# Patient Record
Sex: Male | Born: 1940 | Race: White | Hispanic: No | Marital: Married | Smoking: Never smoker
Health system: Southern US, Community
[De-identification: ages and names within clinical notes are randomized; demographics above are authoritative.]

## PROBLEM LIST (undated history)

## (undated) DIAGNOSIS — D649 Anemia, unspecified: Secondary | ICD-10-CM

## (undated) DIAGNOSIS — I2109 ST elevation (STEMI) myocardial infarction involving other coronary artery of anterior wall: Secondary | ICD-10-CM

## (undated) DIAGNOSIS — I10 Essential (primary) hypertension: Secondary | ICD-10-CM

## (undated) DIAGNOSIS — C3490 Malignant neoplasm of unspecified part of unspecified bronchus or lung: Secondary | ICD-10-CM

## (undated) DIAGNOSIS — I2510 Atherosclerotic heart disease of native coronary artery without angina pectoris: Secondary | ICD-10-CM

## (undated) DIAGNOSIS — E039 Hypothyroidism, unspecified: Secondary | ICD-10-CM

## (undated) DIAGNOSIS — K219 Gastro-esophageal reflux disease without esophagitis: Secondary | ICD-10-CM

## (undated) DIAGNOSIS — Z8719 Personal history of other diseases of the digestive system: Secondary | ICD-10-CM

## (undated) DIAGNOSIS — J439 Emphysema, unspecified: Secondary | ICD-10-CM

## (undated) DIAGNOSIS — R599 Enlarged lymph nodes, unspecified: Secondary | ICD-10-CM

## (undated) DIAGNOSIS — C61 Malignant neoplasm of prostate: Secondary | ICD-10-CM

## (undated) DIAGNOSIS — R0600 Dyspnea, unspecified: Secondary | ICD-10-CM

## (undated) DIAGNOSIS — G4730 Sleep apnea, unspecified: Secondary | ICD-10-CM

## (undated) DIAGNOSIS — M1990 Unspecified osteoarthritis, unspecified site: Secondary | ICD-10-CM

## (undated) DIAGNOSIS — Z87442 Personal history of urinary calculi: Secondary | ICD-10-CM

## (undated) DIAGNOSIS — F4310 Post-traumatic stress disorder, unspecified: Secondary | ICD-10-CM

## (undated) DIAGNOSIS — J189 Pneumonia, unspecified organism: Secondary | ICD-10-CM

## (undated) HISTORY — PX: TONSILLECTOMY: SUR1361

## (undated) HISTORY — PX: OTHER SURGICAL HISTORY: SHX169

## (undated) HISTORY — PX: SHOULDER SURGERY: SHX246

## (undated) HISTORY — PX: COLONOSCOPY: SHX174

---

## 1999-04-27 ENCOUNTER — Ambulatory Visit (HOSPITAL_BASED_OUTPATIENT_CLINIC_OR_DEPARTMENT_OTHER): Admission: RE | Admit: 1999-04-27 | Discharge: 1999-04-27 | Payer: Self-pay | Admitting: Urology

## 1999-08-28 ENCOUNTER — Emergency Department (HOSPITAL_COMMUNITY): Admission: EM | Admit: 1999-08-28 | Discharge: 1999-08-28 | Payer: Self-pay | Admitting: Emergency Medicine

## 1999-08-30 ENCOUNTER — Encounter: Admission: RE | Admit: 1999-08-30 | Discharge: 1999-08-30 | Payer: Self-pay | Admitting: Urology

## 1999-09-25 ENCOUNTER — Encounter: Payer: Self-pay | Admitting: Urology

## 1999-09-25 ENCOUNTER — Ambulatory Visit (HOSPITAL_COMMUNITY): Admission: RE | Admit: 1999-09-25 | Discharge: 1999-09-25 | Payer: Self-pay | Admitting: Urology

## 1999-10-11 ENCOUNTER — Encounter: Admission: RE | Admit: 1999-10-11 | Discharge: 1999-10-11 | Payer: Self-pay | Admitting: Urology

## 1999-10-11 ENCOUNTER — Encounter: Payer: Self-pay | Admitting: Urology

## 2007-05-25 ENCOUNTER — Emergency Department (HOSPITAL_COMMUNITY): Admission: EM | Admit: 2007-05-25 | Discharge: 2007-05-25 | Payer: Self-pay | Admitting: Emergency Medicine

## 2007-05-25 ENCOUNTER — Emergency Department (HOSPITAL_COMMUNITY): Admission: EM | Admit: 2007-05-25 | Discharge: 2007-05-26 | Payer: Self-pay | Admitting: Emergency Medicine

## 2007-05-30 ENCOUNTER — Ambulatory Visit (HOSPITAL_COMMUNITY): Admission: RE | Admit: 2007-05-30 | Discharge: 2007-05-30 | Payer: Self-pay | Admitting: Urology

## 2011-04-03 NOTE — Op Note (Signed)
NAME:  Gregory Scott, Gregory Scott           ACCOUNT NO.:  [id number]   MEDICAL RECORD NO.:  [id number]          PATIENT TYPE:  AMB   LOCATION:  DAY                          FACILITY:  [facility]   PHYSICIAN:  Valetta Fuller, M.D.  DATE OF BIRTH:  06-05-1941   DATE OF PROCEDURE:  05/30/2007  DATE OF DISCHARGE:  05/30/2007                               OPERATIVE REPORT   PREOPERATIVE DIAGNOSIS:  Left distal ureteral calculus.   POSTOPERATIVE DIAGNOSIS:  Left distal ureteral calculus.   PROCEDURE PERFORMED:  Cystoscopy, left retrograde pyelography, left  ureteroscopy, stone basketing and left double-J stent placement 7 x 24  cm.   SURGEON:  Valetta Fuller, M.D.   ANESTHESIA:  General.   INDICATIONS:  Gregory Scott is a 70 year old male.  He has been a  longstanding patient of Dr. 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  He has required lithotripsy  in the past.  The patient recently presented to the emergency room with  left flank pain.  There, he was felt to possibly have nephrolithiasis  but only a KUB was done.  He came into our office on May 26, 2007.  At  that time he was having nausea and ongoing flank pain.  Urinalysis  showed a small degree of microhematuria and a little bit of pyuria.  A  CT scan was done which showed a 4 mm stone at his left ureterovesical  junction.  The patient was told that hopefully he would be able to pass  that spontaneously and he was started on Flomax.  The patient presented  today to our office as a work-in.  He and his wife stated that he was  having ongoing pain with significant nausea and having a hard time  keeping anything down.  She was concerned about his p.o. intake.  He had  not had a definite fever, but apparently had some mild chills.  When we  saw him in the office, he was afebrile but in moderate distress.  It  appeared that he still had a distal ureteral stone.  We talked about  several options with  him including attempts at continuing spontaneous  passage.  The patient elected to proceed with definitive surgery.  He  appeared to understand the advantages and disadvantages, and potential  complications of surgery of this type.  He elected to proceed.  The  patient did have normal renal function, although his creatinine was  probably slightly elevated from baseline at 1.6 and I do not have any  historic values on him.   TECHNIQUE AND FINDINGS:  The patient was brought to the operating room.  He had received perioperative ciprofloxacin.  He had successful  induction of general anesthesia and was then placed in lithotomy  position, prepped and draped in the usual manner.  Cystoscopy revealed  unremarkable anterior urethra.  The patient had mild trilobar  hyperplasia.  Inspection of his bladder showed significant left  hemitrigonal edema with some mounding and inflammation at his left  ureteral orifice.  The right ureteral orifice appeared quite normal.  The stone was not actually  seen but there was considerable edema with  distortion of his ureteral orifice and it was very tight.  It took [place]  approximately 20 to 25 minutes of time to try to get a Glidewire with an  open-ended stent beyond this impacted stone in his intramural ureter.  Eventually we were able to get a guidewire up to the renal pelvis.  Over  the guidewire I placed an open-ended catheter several centimeters up the  ureter and then removed the guidewire.   Retrograde pyelogram was then done by injecting contrast with  fluoroscopic guidance.  One could see a dilated ureter and renal pelvis.  This confirmed that indeed we were in the ureter and there was not an  obvious false passage at that time.  We went ahead and removed the open-  ended catheter.  I felt that, given the considerable edema and real  narrowing of his ureteral orifice, he would require some dilation.  I  chose the inside portion of an access sheath to  provide one-step  dilation of the distal ureter.  After that was accomplished, the distal  ureter was engaged with the ureteroscope.  One could see considerable  mucosa edema.  It appeared there were a couple of small false passages  within the ureter and a small flap of mucosa from the attempts at  placing the guidewire.  An impacted stone was encountered in the  intramural ureter, measured about 4 mm.  This was backed, so it  extracted without difficulty.  The guidewire was confirmed to be in good  position within the ureter.   A 7-French 24 cm stent was then placed over the guidewire with  fluoroscopic as well as visual guidance.  Good positioning was  confirmed.  The patient had some dark blood come out from the stent and  there appeared to be some degree of a hydronephrotic drip.  Lidocaine  jelly was instilled per urethra and the patient was given a B and O  suppository.  He had also received some Toradol.  The patient did well  with surgery, had no obvious complications and was brought to recovery  room in stable condition.           ______________________________  Valetta Fuller, M.D.  Electronically Signed     DSG/MEDQ  D:  05/30/2007  T:  06/01/2007  Job:  [id number]

## 2011-09-04 LAB — URINALYSIS, ROUTINE W REFLEX MICROSCOPIC
Bilirubin Urine: NEGATIVE
Bilirubin Urine: NEGATIVE
Glucose, UA: NEGATIVE
Glucose, UA: NEGATIVE
Ketones, ur: NEGATIVE
Nitrite: NEGATIVE
Protein, ur: 100 — AB
Protein, ur: NEGATIVE
Specific Gravity, Urine: 1.019
Urobilinogen, UA: 0.2
Urobilinogen, UA: 0.2
pH: 5.5

## 2011-09-04 LAB — URINE MICROSCOPIC-ADD ON

## 2011-09-04 LAB — CBC
HCT: 35.2 — ABNORMAL LOW
MCHC: 35.9
MCV: 92.2
Platelets: 264
RBC: 3.82 — ABNORMAL LOW
WBC: 10.8 — ABNORMAL HIGH

## 2011-09-04 LAB — BASIC METABOLIC PANEL
BUN: 16
CO2: 25
Chloride: 103
Creatinine, Ser: 1.64 — ABNORMAL HIGH
Potassium: 3.7

## 2011-09-04 LAB — URINE CULTURE: Culture: NO GROWTH

## 2013-05-05 ENCOUNTER — Other Ambulatory Visit (HOSPITAL_COMMUNITY): Payer: Self-pay | Admitting: *Deleted

## 2013-05-05 ENCOUNTER — Ambulatory Visit (HOSPITAL_COMMUNITY)
Admission: RE | Admit: 2013-05-05 | Discharge: 2013-05-05 | Disposition: A | Discharge status: Home / Self Care | Payer: Medicare Other | Source: Ambulatory Visit | Attending: Orthopedic Surgery | Admitting: Orthopedic Surgery

## 2013-05-05 DIAGNOSIS — M545 Low back pain, unspecified: Secondary | ICD-10-CM | POA: Insufficient documentation

## 2013-05-05 DIAGNOSIS — Q762 Congenital spondylolisthesis: Secondary | ICD-10-CM | POA: Insufficient documentation

## 2013-05-05 DIAGNOSIS — M25551 Pain in right hip: Secondary | ICD-10-CM

## 2014-12-16 DIAGNOSIS — G5761 Lesion of plantar nerve, right lower limb: Secondary | ICD-10-CM | POA: Diagnosis not present

## 2015-02-04 DIAGNOSIS — C61 Malignant neoplasm of prostate: Secondary | ICD-10-CM | POA: Diagnosis not present

## 2015-02-17 DIAGNOSIS — K760 Fatty (change of) liver, not elsewhere classified: Secondary | ICD-10-CM | POA: Diagnosis not present

## 2015-02-17 DIAGNOSIS — N359 Urethral stricture, unspecified: Secondary | ICD-10-CM | POA: Diagnosis not present

## 2015-02-17 DIAGNOSIS — C61 Malignant neoplasm of prostate: Secondary | ICD-10-CM | POA: Diagnosis not present

## 2015-02-17 DIAGNOSIS — R310 Gross hematuria: Secondary | ICD-10-CM | POA: Diagnosis not present

## 2015-02-22 DIAGNOSIS — C61 Malignant neoplasm of prostate: Secondary | ICD-10-CM | POA: Diagnosis not present

## 2015-02-22 DIAGNOSIS — F17200 Nicotine dependence, unspecified, uncomplicated: Secondary | ICD-10-CM | POA: Diagnosis not present

## 2015-02-22 DIAGNOSIS — R918 Other nonspecific abnormal finding of lung field: Secondary | ICD-10-CM | POA: Diagnosis not present

## 2015-02-22 DIAGNOSIS — G4730 Sleep apnea, unspecified: Secondary | ICD-10-CM | POA: Diagnosis not present

## 2015-02-22 DIAGNOSIS — Z720 Tobacco use: Secondary | ICD-10-CM | POA: Diagnosis not present

## 2015-02-23 DIAGNOSIS — R918 Other nonspecific abnormal finding of lung field: Secondary | ICD-10-CM | POA: Diagnosis not present

## 2015-03-01 DIAGNOSIS — G4730 Sleep apnea, unspecified: Secondary | ICD-10-CM | POA: Diagnosis not present

## 2015-03-01 DIAGNOSIS — R918 Other nonspecific abnormal finding of lung field: Secondary | ICD-10-CM | POA: Diagnosis not present

## 2015-03-01 DIAGNOSIS — C61 Malignant neoplasm of prostate: Secondary | ICD-10-CM | POA: Diagnosis not present

## 2015-03-01 DIAGNOSIS — K219 Gastro-esophageal reflux disease without esophagitis: Secondary | ICD-10-CM | POA: Diagnosis not present

## 2015-03-04 DIAGNOSIS — R918 Other nonspecific abnormal finding of lung field: Secondary | ICD-10-CM | POA: Diagnosis not present

## 2015-03-04 DIAGNOSIS — R911 Solitary pulmonary nodule: Secondary | ICD-10-CM | POA: Diagnosis not present

## 2015-03-07 DIAGNOSIS — G4730 Sleep apnea, unspecified: Secondary | ICD-10-CM | POA: Diagnosis not present

## 2015-03-07 DIAGNOSIS — R918 Other nonspecific abnormal finding of lung field: Secondary | ICD-10-CM | POA: Diagnosis not present

## 2015-03-07 DIAGNOSIS — C61 Malignant neoplasm of prostate: Secondary | ICD-10-CM | POA: Diagnosis not present

## 2015-03-10 DIAGNOSIS — C61 Malignant neoplasm of prostate: Secondary | ICD-10-CM | POA: Diagnosis not present

## 2015-06-09 DIAGNOSIS — R918 Other nonspecific abnormal finding of lung field: Secondary | ICD-10-CM | POA: Diagnosis not present

## 2015-06-14 DIAGNOSIS — C61 Malignant neoplasm of prostate: Secondary | ICD-10-CM | POA: Diagnosis not present

## 2015-06-16 DIAGNOSIS — R918 Other nonspecific abnormal finding of lung field: Secondary | ICD-10-CM | POA: Diagnosis not present

## 2015-06-23 DIAGNOSIS — R918 Other nonspecific abnormal finding of lung field: Secondary | ICD-10-CM | POA: Diagnosis not present

## 2015-07-26 DIAGNOSIS — S83207A Unspecified tear of unspecified meniscus, current injury, left knee, initial encounter: Secondary | ICD-10-CM | POA: Diagnosis not present

## 2015-07-26 DIAGNOSIS — M25562 Pain in left knee: Secondary | ICD-10-CM | POA: Diagnosis not present

## 2015-08-16 DIAGNOSIS — M25562 Pain in left knee: Secondary | ICD-10-CM | POA: Diagnosis not present

## 2015-09-13 DIAGNOSIS — C61 Malignant neoplasm of prostate: Secondary | ICD-10-CM | POA: Diagnosis not present

## 2015-09-20 DIAGNOSIS — M7021 Olecranon bursitis, right elbow: Secondary | ICD-10-CM | POA: Diagnosis not present

## 2015-09-21 ENCOUNTER — Inpatient Hospital Stay (HOSPITAL_COMMUNITY)
Admission: EM | Admit: 2015-09-21 | Discharge: 2015-09-23 | DRG: 247 | Disposition: A | Discharge status: Home / Self Care | Payer: Medicare Other | Attending: Cardiovascular Disease | Admitting: Cardiovascular Disease

## 2015-09-21 ENCOUNTER — Encounter (HOSPITAL_COMMUNITY)
Admission: EM | Disposition: A | Discharge status: Home / Self Care | Payer: Federal, State, Local not specified - PPO | Source: Home / Self Care | Attending: Cardiovascular Disease

## 2015-09-21 ENCOUNTER — Encounter (HOSPITAL_COMMUNITY): Payer: Self-pay | Admitting: Emergency Medicine

## 2015-09-21 ENCOUNTER — Emergency Department (HOSPITAL_COMMUNITY): Payer: Medicare Other

## 2015-09-21 DIAGNOSIS — J439 Emphysema, unspecified: Secondary | ICD-10-CM | POA: Diagnosis present

## 2015-09-21 DIAGNOSIS — I10 Essential (primary) hypertension: Secondary | ICD-10-CM | POA: Diagnosis not present

## 2015-09-21 DIAGNOSIS — E785 Hyperlipidemia, unspecified: Secondary | ICD-10-CM | POA: Insufficient documentation

## 2015-09-21 DIAGNOSIS — I213 ST elevation (STEMI) myocardial infarction of unspecified site: Secondary | ICD-10-CM | POA: Insufficient documentation

## 2015-09-21 DIAGNOSIS — Z955 Presence of coronary angioplasty implant and graft: Secondary | ICD-10-CM

## 2015-09-21 DIAGNOSIS — I2102 ST elevation (STEMI) myocardial infarction involving left anterior descending coronary artery: Secondary | ICD-10-CM

## 2015-09-21 DIAGNOSIS — R079 Chest pain, unspecified: Secondary | ICD-10-CM | POA: Diagnosis not present

## 2015-09-21 DIAGNOSIS — Z8546 Personal history of malignant neoplasm of prostate: Secondary | ICD-10-CM | POA: Diagnosis not present

## 2015-09-21 DIAGNOSIS — I2109 ST elevation (STEMI) myocardial infarction involving other coronary artery of anterior wall: Secondary | ICD-10-CM | POA: Diagnosis not present

## 2015-09-21 DIAGNOSIS — Z923 Personal history of irradiation: Secondary | ICD-10-CM

## 2015-09-21 DIAGNOSIS — I2510 Atherosclerotic heart disease of native coronary artery without angina pectoris: Secondary | ICD-10-CM

## 2015-09-21 DIAGNOSIS — E039 Hypothyroidism, unspecified: Secondary | ICD-10-CM | POA: Diagnosis present

## 2015-09-21 HISTORY — DX: ST elevation (STEMI) myocardial infarction involving other coronary artery of anterior wall: I21.09

## 2015-09-21 HISTORY — PX: CARDIAC CATHETERIZATION: SHX172

## 2015-09-21 HISTORY — DX: Emphysema, unspecified: J43.9

## 2015-09-21 HISTORY — DX: Malignant neoplasm of prostate: C61

## 2015-09-21 LAB — CREATININE, SERUM
CREATININE: 0.95 mg/dL (ref 0.61–1.24)
GFR calc Af Amer: >60 mL/min (ref >60)
GFR calc non Af Amer: >60 mL/min (ref >60)

## 2015-09-21 LAB — CBC
HCT: 35.3 % — ABNORMAL LOW (ref 39.0–52.0)
HCT: 31.3 % — ABNORMAL LOW (ref 39.0–52.0)
Hemoglobin: 12.1 g/dL — ABNORMAL LOW (ref 13.0–17.0)
Hemoglobin: 10.9 g/dL — ABNORMAL LOW (ref 13.0–17.0)
MCH: 30.8 pg (ref 26.0–34.0)
MCH: 31.4 pg (ref 26.0–34.0)
MCHC: 34.3 g/dL (ref 30.0–36.0)
MCHC: 34.8 g/dL (ref 30.0–36.0)
MCV: 89.8 fL (ref 78.0–100.0)
MCV: 90.2 fL (ref 78.0–100.0)
PLATELETS: 246 10*3/uL (ref 150–400)
PLATELETS: 195 10*3/uL (ref 150–400)
RBC: 3.93 MIL/uL — ABNORMAL LOW (ref 4.22–5.81)
RBC: 3.47 MIL/uL — AB (ref 4.22–5.81)
RDW: 12 % (ref 11.5–15.5)
RDW: 12 % (ref 11.5–15.5)
WBC: 7.5 10*3/uL (ref 4.0–10.5)
WBC: 10 10*3/uL (ref 4.0–10.5)

## 2015-09-21 LAB — COMPREHENSIVE METABOLIC PANEL
ALBUMIN: 3.7 g/dL (ref 3.5–5.0)
ALT: 27 U/L (ref 17–63)
ANION GAP: 10 (ref 5–15)
AST: 30 U/L (ref 15–41)
Alkaline Phosphatase: 71 U/L (ref 38–126)
BUN: 12 mg/dL (ref 6–20)
CO2: 19 mmol/L — AB (ref 22–32)
Calcium: 10.1 mg/dL (ref 8.9–10.3)
Chloride: 103 mmol/L (ref 101–111)
Creatinine, Ser: 1.06 mg/dL (ref 0.61–1.24)
GFR calc Af Amer: >60 mL/min (ref >60)
GLUCOSE: 170 mg/dL — AB (ref 65–99)
POTASSIUM: 3.8 mmol/L (ref 3.5–5.1)
Sodium: 132 mmol/L — ABNORMAL LOW (ref 135–145)
TOTAL PROTEIN: 6.8 g/dL (ref 6.5–8.1)
Total Bilirubin: 0.9 mg/dL (ref 0.3–1.2)

## 2015-09-21 LAB — I-STAT CHEM 8, ED
BUN: 17 mg/dL (ref 6–20)
CREATININE: 0.9 mg/dL (ref 0.61–1.24)
Calcium, Ion: 1.27 mmol/L (ref 1.13–1.30)
Chloride: 104 mmol/L (ref 101–111)
Glucose, Bld: 172 mg/dL — ABNORMAL HIGH (ref 65–99)
HEMATOCRIT: 37 % — AB (ref 39.0–52.0)
Hemoglobin: 12.6 g/dL — ABNORMAL LOW (ref 13.0–17.0)
POTASSIUM: 3.9 mmol/L (ref 3.5–5.1)
SODIUM: 138 mmol/L (ref 135–145)
TCO2: 17 mmol/L (ref 0–100)

## 2015-09-21 LAB — MRSA PCR SCREENING: MRSA BY PCR: NEGATIVE

## 2015-09-21 LAB — POCT ACTIVATED CLOTTING TIME
ACTIVATED CLOTTING TIME: 405 s
Activated Clotting Time: 134 seconds

## 2015-09-21 LAB — POCT I-STAT TROPONIN I: Troponin i, poc: 0.03 ng/mL (ref 0.00–0.08)

## 2015-09-21 LAB — BRAIN NATRIURETIC PEPTIDE: B NATRIURETIC PEPTIDE 5: 118 pg/mL — AB (ref 0.0–100.0)

## 2015-09-21 LAB — T4, FREE: Free T4: 0.94 ng/dL (ref 0.61–1.12)

## 2015-09-21 LAB — TSH: TSH: 2.091 u[IU]/mL (ref 0.350–4.500)

## 2015-09-21 LAB — APTT: APTT: 24 s (ref 24–37)

## 2015-09-21 LAB — PROTIME-INR
INR: 1.05 (ref 0.00–1.49)
Prothrombin Time: 13.9 seconds (ref 11.6–15.2)

## 2015-09-21 LAB — TROPONIN I
TROPONIN I: 9.44 ng/mL — AB (ref <0.031)
Troponin I: 1.25 ng/mL (ref <0.031)
Troponin I: 7.01 ng/mL (ref <0.031)

## 2015-09-21 SURGERY — LEFT HEART CATH AND CORONARY ANGIOGRAPHY
Anesthesia: LOCAL | Laterality: Right

## 2015-09-21 MED ORDER — VERAPAMIL HCL 2.5 MG/ML IV SOLN
INTRAVENOUS | Status: DC | PRN
  Administered 2015-09-21: 08:00:00 via INTRA_ARTERIAL

## 2015-09-21 MED ORDER — TICAGRELOR 90 MG PO TABS
ORAL_TABLET | ORAL | Status: AC
  Filled 2015-09-21: qty 1

## 2015-09-21 MED ORDER — SODIUM CHLORIDE 0.9 % IV SOLN
INTRAVENOUS | Status: DC
  Administered 2015-09-21: 20 mL/h via INTRAVENOUS

## 2015-09-21 MED ORDER — ACETAMINOPHEN 325 MG PO TABS: 650.0000 mg | ORAL_TABLET | ORAL | Status: DC | PRN

## 2015-09-21 MED ORDER — BIVALIRUDIN 250 MG IV SOLR
INTRAVENOUS | Status: AC
  Filled 2015-09-21: qty 250

## 2015-09-21 MED ORDER — MIDAZOLAM HCL 2 MG/2ML IJ SOLN
INTRAMUSCULAR | Status: AC
  Filled 2015-09-21: qty 4

## 2015-09-21 MED ORDER — FENTANYL CITRATE (PF) 100 MCG/2ML IJ SOLN
INTRAMUSCULAR | Status: DC | PRN
  Administered 2015-09-21: 25 ug via INTRAVENOUS

## 2015-09-21 MED ORDER — NITROGLYCERIN 1 MG/10 ML FOR IR/CATH LAB
INTRA_ARTERIAL | Status: DC | PRN
  Administered 2015-09-21: 150 ug via INTRACORONARY

## 2015-09-21 MED ORDER — LIDOCAINE HCL (PF) 1 % IJ SOLN
INTRAMUSCULAR | Status: AC
  Filled 2015-09-21: qty 30

## 2015-09-21 MED ORDER — SODIUM CHLORIDE 0.9 % IJ SOLN: 3.0000 mL | INTRAMUSCULAR | Status: DC | PRN

## 2015-09-21 MED ORDER — SODIUM CHLORIDE 0.9 % IV SOLN
INTRAVENOUS | Status: AC
  Administered 2015-09-21: 11:00:00 via INTRAVENOUS

## 2015-09-21 MED ORDER — SODIUM CHLORIDE 0.9 % IV SOLN: 250.0000 mL | INTRAVENOUS | Status: DC | PRN

## 2015-09-21 MED ORDER — HEPARIN SODIUM (PORCINE) 5000 UNIT/ML IJ SOLN
INTRAMUSCULAR | Status: AC
  Filled 2015-09-21: qty 1

## 2015-09-21 MED ORDER — TICAGRELOR 90 MG PO TABS
ORAL_TABLET | ORAL | Status: DC | PRN
  Administered 2015-09-21: 180 mg via ORAL

## 2015-09-21 MED ORDER — HEPARIN SODIUM (PORCINE) 1000 UNIT/ML IJ SOLN
INTRAMUSCULAR | Status: AC
  Filled 2015-09-21: qty 1

## 2015-09-21 MED ORDER — HEPARIN SODIUM (PORCINE) 5000 UNIT/ML IJ SOLN
4000.0000 [IU] | INTRAMUSCULAR | Status: AC
  Administered 2015-09-21: 4000 [IU] via INTRAVENOUS

## 2015-09-21 MED ORDER — NITROGLYCERIN 1 MG/10 ML FOR IR/CATH LAB
INTRA_ARTERIAL | Status: AC
  Filled 2015-09-21: qty 10

## 2015-09-21 MED ORDER — MIDAZOLAM HCL 2 MG/2ML IJ SOLN
INTRAMUSCULAR | Status: DC | PRN
  Administered 2015-09-21: 1 mg via INTRAVENOUS

## 2015-09-21 MED ORDER — INFLUENZA VAC SPLIT QUAD 0.5 ML IM SUSY
0.5000 mL | PREFILLED_SYRINGE | INTRAMUSCULAR | Status: AC
  Administered 2015-09-22: 0.5 mL via INTRAMUSCULAR
  Filled 2015-09-21: qty 0.5

## 2015-09-21 MED ORDER — NITROGLYCERIN 0.4 MG SL SUBL: 0.4000 mg | SUBLINGUAL_TABLET | SUBLINGUAL | Status: DC | PRN

## 2015-09-21 MED ORDER — VERAPAMIL HCL 2.5 MG/ML IV SOLN
INTRAVENOUS | Status: AC
  Filled 2015-09-21: qty 2

## 2015-09-21 MED ORDER — SODIUM CHLORIDE 0.9 % IV SOLN
250.0000 mg | INTRAVENOUS | Status: DC | PRN
  Administered 2015-09-21: 0.25 mg/kg/h via INTRAVENOUS

## 2015-09-21 MED ORDER — ONDANSETRON HCL 4 MG/2ML IJ SOLN: 4.0000 mg | Freq: Four times a day (QID) | INTRAMUSCULAR | Status: DC | PRN

## 2015-09-21 MED ORDER — OXYCODONE-ACETAMINOPHEN 5-325 MG PO TABS
1.0000 | ORAL_TABLET | ORAL | Status: DC | PRN
  Administered 2015-09-21 (×2): 1 via ORAL
  Filled 2015-09-21 (×2): qty 1

## 2015-09-21 MED ORDER — CARVEDILOL 3.125 MG PO TABS
3.1250 mg | ORAL_TABLET | Freq: Two times a day (BID) | ORAL | Status: DC
  Administered 2015-09-21 – 2015-09-23 (×5): 3.125 mg via ORAL
  Filled 2015-09-21 (×5): qty 1

## 2015-09-21 MED ORDER — TICAGRELOR 90 MG PO TABS
90.0000 mg | ORAL_TABLET | Freq: Two times a day (BID) | ORAL | Status: DC
  Administered 2015-09-21 – 2015-09-23 (×4): 90 mg via ORAL
  Filled 2015-09-21 (×4): qty 1

## 2015-09-21 MED ORDER — FENTANYL CITRATE (PF) 100 MCG/2ML IJ SOLN
INTRAMUSCULAR | Status: AC
  Filled 2015-09-21: qty 4

## 2015-09-21 MED ORDER — SODIUM CHLORIDE 0.9 % IJ SOLN
3.0000 mL | Freq: Two times a day (BID) | INTRAMUSCULAR | Status: DC
  Administered 2015-09-21 – 2015-09-23 (×4): 3 mL via INTRAVENOUS

## 2015-09-21 MED ORDER — ASPIRIN EC 81 MG PO TBEC
81.0000 mg | DELAYED_RELEASE_TABLET | Freq: Every day | ORAL | Status: DC
  Administered 2015-09-22 – 2015-09-23 (×2): 81 mg via ORAL
  Filled 2015-09-21 (×3): qty 1

## 2015-09-21 MED ORDER — HEPARIN SODIUM (PORCINE) 5000 UNIT/ML IJ SOLN
5000.0000 [IU] | Freq: Three times a day (TID) | INTRAMUSCULAR | Status: DC
  Administered 2015-09-21 – 2015-09-23 (×5): 5000 [IU] via SUBCUTANEOUS
  Filled 2015-09-21 (×5): qty 1

## 2015-09-21 MED ORDER — HEPARIN (PORCINE) IN NACL 2-0.9 UNIT/ML-% IJ SOLN
INTRAMUSCULAR | Status: AC
  Filled 2015-09-21: qty 1000

## 2015-09-21 SURGICAL SUPPLY — 22 items
BALLN TREK RX 2.5X12 (BALLOONS) ×3
BALLN ~~LOC~~ EMERGE MR 3.75X12 (BALLOONS) ×3
BALLOON TREK RX 2.5X12 (BALLOONS) ×2 IMPLANT
BALLOON ~~LOC~~ EMERGE MR 3.75X12 (BALLOONS) ×2 IMPLANT
CATH INFINITI 5FR ANG PIGTAIL (CATHETERS) ×3 IMPLANT
CATH INFINITI 5FR MULTPACK ANG (CATHETERS) IMPLANT
CATH INFINITI JR4 5F (CATHETERS) ×3 IMPLANT
CATH VISTA GUIDE 6FR XBLAD3.5 (CATHETERS) ×3 IMPLANT
DEVICE RAD COMP TR BAND LRG (VASCULAR PRODUCTS) ×3 IMPLANT
GLIDESHEATH SLEND SS 6F .021 (SHEATH) ×3 IMPLANT
KIT ENCORE 26 ADVANTAGE (KITS) ×3 IMPLANT
KIT HEART LEFT (KITS) ×3 IMPLANT
PACK CARDIAC CATHETERIZATION (CUSTOM PROCEDURE TRAY) ×3 IMPLANT
SHEATH PINNACLE 5F 10CM (SHEATH) IMPLANT
STENT SYNERGY DES 3.5X16 (Permanent Stent) ×3 IMPLANT
SYR MEDRAD MARK V 150ML (SYRINGE) ×3 IMPLANT
TRANSDUCER W/STOPCOCK (MISCELLANEOUS) ×3 IMPLANT
TUBING CIL FLEX 10 FLL-RA (TUBING) ×3 IMPLANT
TUBING CONTRAST HIGH PRESS 20 (MISCELLANEOUS) ×3 IMPLANT
WIRE COUGAR XT STRL 190CM (WIRE) ×6 IMPLANT
WIRE EMERALD 3MM-J .035X150CM (WIRE) IMPLANT
WIRE SAFE-T 1.5MM-J .035X260CM (WIRE) ×3 IMPLANT

## 2015-09-21 NOTE — ED Notes (Signed)
Pt placed in gown and in bed. Pt monitored by pulse ox, bp cuff, 12-lead, and Zoll with radiolucent pads.

## 2015-09-21 NOTE — ED Provider Notes (Signed)
CSN: [id number]     Arrival date & time 09/21/15  1856 History   First MD Initiated Contact with Patient 09/21/15 [phone]     Chief Complaint  Patient presents with  . Code STEMI     (Consider location/radiation/quality/duration/timing/severity/associated sxs/prior Treatment) The history is provided by the patient and the EMS personnel.   patient presents with a STEMI. EMS had a change in the chest pain and had a second EKG that showed ST elevation. STEMI was called prehospital. Patient continues to have chest pressure. Again early this morning but has felt bad nonspecifically over last couple days. No blood in stool. No shortness of breath. No cough. States it is a pressure limit was chest. No relief with nitroglycerin. He has had aspirin already.  History reviewed. No pertinent past medical history. No past surgical history on file. No family history on file. Social History  Substance Use Topics  . Smoking status: Never Smoker   . Smokeless tobacco: None  . Alcohol Use: No    Review of Systems  Constitutional: Positive for diaphoresis.  Respiratory: Negative for shortness of breath.   Cardiovascular: Positive for chest pain.  Gastrointestinal: Negative for nausea.  Genitourinary: Negative for flank pain.  Musculoskeletal: Negative for back pain.  Skin: Negative for wound.  Neurological: Negative for light-headedness.      Allergies  Statins  Home Medications   Prior to Admission medications   Not on File   BP 117/75 mmHg  Pulse 66  Resp 16  Ht _0  (1.778 m)  Wt 170 lb (77.111 kg)  BMI 24.39 kg/m2  SpO2 96% Physical Exam  Constitutional: He appears well-developed.  Diaphoresis blow nose. Patient appears uncomfortable  HENT:  Head: Atraumatic.  Neck: Neck supple.  Cardiovascular: Normal rate.   Pulmonary/Chest: Effort normal and breath sounds normal.  Abdominal: Soft.  Musculoskeletal: Normal range of motion.  Ace bandage around right elbow.   Neurological: He is alert.  Skin: Skin is warm. He is diaphoretic.    ED Course  Procedures (including critical care time) Labs Review Labs Reviewed  I-STAT CHEM 8, ED - Abnormal; Notable for the following:    Glucose, Bld 172 (*)    Hemoglobin 12.6 (*)    HCT 37.0 (*)    All other components within normal limits  APTT  CBC  COMPREHENSIVE METABOLIC PANEL  PROTIME-INR  I-STAT TROPOININ, ED  POCT I-STAT TROPONIN I    Imaging Review No results found. I have personally reviewed and evaluated these images and lab results as part of my medical decision-making.   EKG Interpretation   Date/Time:  Wednesday September 21 2015 07:24:36 EDT Ventricular Rate:  55 PR Interval:  141 QRS Duration: 89 QT Interval:  417 QTC Calculation: 399 R Axis:   -9 Text Interpretation:  Sinus rhythm Left ventricular hypertrophy  Nonspecific T abnormalities, inferior leads ST elevation, consider  anterolateral injury ** ** ACUTE MI / STEMI ** ** Confirmed by [place]   MD, Kimberli Winne [phone]) on 09/21/2015 7:30:51 AM      MDM   Final diagnoses:  ST elevation myocardial infarction (STEMI), unspecified artery [facility])    Patient with chest pain and ST elevation on EKG. Met in ED by Dr. Burt Knack and taken emergently to cath lab. Already had aspirin and heparin was given in the ED.    Davonna Belling, MD 09/21/15 [phone]

## 2015-09-21 NOTE — H&P (Signed)
History and Physical  Patient ID: Gregory Scott MRN: [id number], SOB: 1941/11/12 74 y.o. Date of Encounter: 09/21/2015, 7:47 AM  Primary Physician: No primary care provider on file. Primary Cardiologist: new  Chief Complaint: chest pain  HPI: 74 y.o. male who presented to [facility] on 09/21/2015 with complaints of chest pain. He is brought in by EMS as a 'Code STEMI.'   The patient has had chest pain on and off for 48 hours. He has no past history of cardiac disease. States that he "felt really bad" 2 days ago. This morning, he awoke with severe substernal chest pain radiating to both arms. He became short of breath and broke out in a sweat. He called EMS. EKG is suggestive of a lateral MI and a code STEMI is paged out. In the emergency department, he received aspirin 324 mg and heparin 4000 units intravenously. Currently, the patient rates his pain at 6/10.  The patient has chronic shortness of breath. He states that he has emphysema, but he has never smoked. 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 He states this is being observed. He has not had any specific treatment. The patient has a history of prostate cancer about 4 years ago and was treated with radiation seeds. He has no other complaints at present.   Past Medical History  Diagnosis Date  . Emphysema lung ([place])   . Prostate cancer [facility])      Surgical History:  Past Surgical History  Procedure Laterality Date  . Knee surgery    . Shoulder surgery    . Tonsillectomy       Home Meds: Prior to Admission medications   Not on File    Allergies:  Allergies  Allergen Reactions  . Statins     Social History   Social History  . Marital Status: Married    Spouse Name: N/A  . Number of Children: N/A  . Years of Education: N/A   Occupational History  . Not on file.   Social History Main  Topics  . Smoking status: Never Smoker   . Smokeless tobacco: Not on file  . Alcohol Use: No  . Drug Use: Not on file  . Sexual Activity: Not on file   Other Topics Concern  . Not on file   Social History Narrative   Worked in a Foundry   Married   Nonsmoker     Family History  Problem Relation Age of Onset  . Coronary artery disease Mother 44    Review of Systems: General: negative for chills, fever, night sweats or weight changes.  ENT: negative for rhinorrhea or epistaxis Cardiovascular: see HPI Dermatological: negative for rash Respiratory:  Positive for shortness of breath GI: negative for nausea, vomiting, diarrhea, bright red blood per rectum, melena, or hematemesis GU: no hematuria, urgency, or frequency Neurologic: negative for visual changes, syncope, headache, or dizziness Heme: no easy bruising or bleeding Endo: negative for excessive thirst, thyroid disorder, or flushing Musculoskeletal: negative for joint pain or swelling, negative for myalgias All other systems reviewed and are otherwise negative except as noted above.  Physical Exam: Blood pressure 117/75, pulse 66, resp. rate 16, height '5\' 10"'$  (1.778 m), weight 170 lb (77.111 kg), SpO2 96 %. General: Well developed, well nourished, alert and oriented, in no acute distress. HEENT: Normocephalic, atraumatic, sclera anicteric Neck: Supple.  Carotids 2+ without bruits. JVP normal Lungs:  Prolonged  expiratory phase , clear lung fields Heart: RRR with normal S1 and S2. No murmurs, rubs, or gallops appreciated. Abdomen: Soft, non-tender, non-distended with normoactive bowel sounds. No hepatomegaly. No rebound/guarding. No obvious abdominal masses. Back: No CVA tenderness Msk:  Strength and tone appear normal for age. Extremities: No clubbing, cyanosis, or edema.  Distal pedal pulses are 2+ and equal bilaterally. Neuro: CNII-XII intact, moves all extremities spontaneously. Psych:  Responds to questions  appropriately with a normal affect. Skin: warm and dry without rash   Labs:   Lab Results  Component Value Date   WBC 7.5 09/21/2015   HGB 12.6* 09/21/2015   HCT 37.0* 09/21/2015   MCV 89.8 09/21/2015   PLT 246 09/21/2015     Recent Labs Lab 09/21/15 0734  NA 138  K 3.9  CL 104  BUN 17  CREATININE 0.90  GLUCOSE 172*   No results for input(s): CKTOTAL, CKMB, TROPONINI in the last 72 hours. No results found for: CHOL, HDL, LDLCALC, TRIG No results found for: DDIMER  Radiology/Studies:  No results found.   EKG:  Sinus rhythm with acute lateral injury  pattern  ASSESSMENT AND PLAN:   1. Acute anterolateral STEMI: The patient has received aspirin and heparin. He will  Be taken emergently to the cardiac catheterization lab for catheterization and primary PCI. Further disposition pending findings and the Cath Lab.  2. Hyperlipidemia: Unable to take statin drugs. He has had severe myalgias. Will have to explore alternatives pending his lipids which will be checked with tomorrow morning's lab work.   3. Emphysema: Will monitor his respiratory status carefully during his hospitalization.  Deatra James MD 09/21/2015, 7:47 AM

## 2015-09-21 NOTE — ED Notes (Signed)
From work via [employer], STEMI called on arrival, CP onset this AM at work, SOB, diaphoretic, VSS,   324 ASA given 2 nitros

## 2015-09-22 DIAGNOSIS — I10 Essential (primary) hypertension: Secondary | ICD-10-CM

## 2015-09-22 DIAGNOSIS — I2109 ST elevation (STEMI) myocardial infarction involving other coronary artery of anterior wall: Principal | ICD-10-CM

## 2015-09-22 DIAGNOSIS — E785 Hyperlipidemia, unspecified: Secondary | ICD-10-CM | POA: Insufficient documentation

## 2015-09-22 LAB — BASIC METABOLIC PANEL
Anion gap: 5 (ref 5–15)
BUN: 14 mg/dL (ref 6–20)
CALCIUM: 9.2 mg/dL (ref 8.9–10.3)
CO2: 24 mmol/L (ref 22–32)
CREATININE: 1.12 mg/dL (ref 0.61–1.24)
Chloride: 109 mmol/L (ref 101–111)
GLUCOSE: 116 mg/dL — AB (ref 65–99)
Potassium: 3.9 mmol/L (ref 3.5–5.1)
Sodium: 138 mmol/L (ref 135–145)

## 2015-09-22 LAB — LIPID PANEL
CHOLESTEROL: 272 mg/dL — AB (ref 0–200)
HDL: 40 mg/dL — ABNORMAL LOW (ref >40)
LDL CALC: 198 mg/dL — AB (ref 0–99)
TRIGLYCERIDES: 171 mg/dL — AB (ref <150)
Total CHOL/HDL Ratio: 6.8 RATIO
VLDL: 34 mg/dL (ref 0–40)

## 2015-09-22 LAB — CBC
HCT: 35 % — ABNORMAL LOW (ref 39.0–52.0)
Hemoglobin: 11.6 g/dL — ABNORMAL LOW (ref 13.0–17.0)
MCH: 30.3 pg (ref 26.0–34.0)
MCHC: 33.1 g/dL (ref 30.0–36.0)
MCV: 91.4 fL (ref 78.0–100.0)
Platelets: 224 K/uL (ref 150–400)
RBC: 3.83 MIL/uL — ABNORMAL LOW (ref 4.22–5.81)
RDW: 12.3 % (ref 11.5–15.5)
WBC: 10.3 K/uL (ref 4.0–10.5)

## 2015-09-22 LAB — HEMOGLOBIN A1C
Hgb A1c MFr Bld: 5.7 % — ABNORMAL HIGH (ref 4.8–5.6)
Mean Plasma Glucose: 117 mg/dL

## 2015-09-22 LAB — TROPONIN I: Troponin I: 5.8 ng/mL (ref <0.031)

## 2015-09-22 MED ORDER — PRAZOSIN HCL 1 MG PO CAPS
1.0000 mg | ORAL_CAPSULE | Freq: Every day | ORAL | Status: DC
  Administered 2015-09-22: 1 mg via ORAL
  Filled 2015-09-22 (×2): qty 1

## 2015-09-22 MED ORDER — ROPINIROLE HCL 0.25 MG PO TABS: 0.2500 mg | ORAL_TABLET | Freq: Three times a day (TID) | ORAL | Status: DC

## 2015-09-22 MED ORDER — ROPINIROLE HCL 1 MG PO TABS
0.5000 mg | ORAL_TABLET | Freq: Every day | ORAL | Status: DC
  Administered 2015-09-22: 0.5 mg via ORAL
  Filled 2015-09-22: qty 1

## 2015-09-22 MED ORDER — ALBUTEROL SULFATE (2.5 MG/3ML) 0.083% IN NEBU: 2.5000 mg | INHALATION_SOLUTION | Freq: Four times a day (QID) | RESPIRATORY_TRACT | Status: DC | PRN

## 2015-09-22 MED ORDER — LISINOPRIL 2.5 MG PO TABS
2.5000 mg | ORAL_TABLET | Freq: Every day | ORAL | Status: DC
  Administered 2015-09-22 – 2015-09-23 (×2): 2.5 mg via ORAL
  Filled 2015-09-22 (×2): qty 1

## 2015-09-22 MED ORDER — SERTRALINE HCL 50 MG PO TABS
50.0000 mg | ORAL_TABLET | Freq: Every day | ORAL | Status: DC
  Administered 2015-09-23: 50 mg via ORAL
  Filled 2015-09-22: qty 1

## 2015-09-22 MED ORDER — ACETAMINOPHEN-CODEINE #3 300-30 MG PO TABS: 1.0000 | ORAL_TABLET | Freq: Every day | ORAL | Status: DC | PRN

## 2015-09-22 MED ORDER — ROSUVASTATIN CALCIUM 10 MG PO TABS
5.0000 mg | ORAL_TABLET | ORAL | Status: DC
  Administered 2015-09-22: 5 mg via ORAL
  Filled 2015-09-22: qty 1

## 2015-09-22 MED ORDER — GABAPENTIN 300 MG PO CAPS
300.0000 mg | ORAL_CAPSULE | Freq: Every day | ORAL | Status: DC
  Administered 2015-09-22: 300 mg via ORAL
  Filled 2015-09-22: qty 1

## 2015-09-22 MED ORDER — LEVOTHYROXINE SODIUM 50 MCG PO TABS: 50.0000 ug | ORAL_TABLET | Freq: Every day | ORAL | Status: DC

## 2015-09-22 MED ORDER — ROPINIROLE HCL 0.25 MG PO TABS
0.2500 mg | ORAL_TABLET | Freq: Two times a day (BID) | ORAL | Status: DC
  Administered 2015-09-23 (×2): 0.25 mg via ORAL
  Filled 2015-09-22 (×3): qty 1

## 2015-09-22 MED FILL — Heparin Sodium (Porcine) 2 Unit/ML in Sodium Chloride 0.9%: INTRAMUSCULAR | Qty: 500 | Status: AC

## 2015-09-22 MED FILL — Lidocaine HCl Local Preservative Free (PF) Inj 1%: INTRAMUSCULAR | Qty: 30 | Status: AC

## 2015-09-22 NOTE — Progress Notes (Signed)
CARDIAC REHAB PHASE I   PRE:  Rate/Rhythm: 64 SR    BP: sitting 113/66    SaO2: 97 RA  MODE:  Ambulation: 700 ft   POST:  Rate/Rhythm: 85 SR    BP: sitting 104/68     SaO2:   Tolerated well, no c/o except his normal SOB (seems minimal). Ed completed with good understanding. Will send referral to [place].  [id number]   Darrick Meigs CES, ACSM 09/22/2015 9:55 AM

## 2015-09-22 NOTE — Care Management Note (Signed)
Case Management Note  Patient Details  Name: Amerigo Mcglory MRN: [id number] Date of Birth: July 20, 1941  Subjective/Objective:       Adm w mi             Action/Plan: lives w wife   Expected Discharge Date:                  Expected Discharge Plan:  Home/Self Care  In-House Referral:     Discharge planning Services  CM Consult  Post Acute Care Choice:    Choice offered to:     DME Arranged:    DME Agency:     HH Arranged:    HH Agency:     Status of Service:  In process, will continue to follow  Medicare Important Message Given:    Date Medicare IM Given:    Medicare IM give by:    Date Additional Medicare IM Given:    Additional Medicare Important Message give by:     If discussed at [place] of Stay Meetings, dates discussed:    Additional Comments: gave pt 30day free brilinta card.  Lacretia Leigh, RN 09/22/2015, 9:41 AM

## 2015-09-22 NOTE — Progress Notes (Signed)
    Subjective:  This AM, he denies any chest pain and feels much better. Able to ambulate around the unit.  Objective:  Vital Signs in the last 24 hours: Temp:  [97.7 F (36.5 C)-98.8 F (37.1 C)] 98 F (36.7 C) (11/03 0400) Pulse Rate:  [0-295] 58 (11/03 0600) Resp:  [0-27] 17 (11/02 1600) BP: (110-152)/(62-100) 121/71 mmHg (11/03 0600) SpO2:  [0 %-99 %] 96 % (11/03 0600) Weight:  [170 lb (77.111 kg)-170 lb 13.7 oz (77.5 kg)] 170 lb 13.7 oz (77.5 kg) (11/02 0915)  Intake/Output from previous day: 11/02 0701 - 11/03 0700 In: 643.3 [P.O.:600; I.V.:43.3] Out: 1250 [Urine:1250]  Physical Exam: Pt is alert and oriented, NAD, sitting up in chair HEENT: normal Neck: JVP - normal Lungs: CTA bilaterally CV: RRR without murmur or gallop Abd: soft, NT, Positive BS, no hepatomegaly Ext: no C/C/E, distal pulses intact and equal Skin: warm/dry no rash   Lab Results:  Recent Labs  09/21/15 1000 09/22/15 0231  WBC 10.0 10.3  HGB 10.9* 11.6*  PLT 195 224    Recent Labs  09/21/15 0723 09/21/15 0734 09/21/15 1000 09/22/15 0231  NA 132* 138  --  138  K 3.8 3.9  --  3.9  CL 103 104  --  109  CO2 19*  --   --  24  GLUCOSE 170* 172*  --  116*  BUN 12 17  --  14  CREATININE 1.06 0.90 0.95 1.12    Recent Labs  09/21/15 1506 09/21/15 2055  TROPONINI 7.01* 9.44*    Cardiac Studies: Procedures  Mid RCA lesion, 25% stenosed.  Prox LAD lesion, 99% stenosed. Post intervention, there is a 0% residual stenosis.  There is mild left ventricular systolic dysfunction.  1. Critical stenosis of the proximal LAD, treated successfully with a 3.5 mm drug-eluting stent 2. Minor nonobstructive stenosis of the RCA 3. Widely patent left circumflex with no significant stenosis 4. Mild segmental LV systolic dysfunction consistent with the patient's acute anterolateral MI, estimated LVEF 45-50%  Assessment/Plan:  74 year old male with h/o prostate cancer s/p brachytherapy who  presented with acute anterolateral STEMI s/p proximal DES to proximal LAD (11/2).   Anteriolateral STEMI s/p DES: EKG with ST changes in anteriolateral leads though improved on repeat EKG. No symptoms currently.  -Continue Coreg 3.'125mg'$  twice daily.  Systolic dysfunction: EF 94-17% on cath yesterday. -Check echo today.  Hyperlipidemia: Total cholesterol 272, LDL 198. Intolerance to statins in the past due to myalgias and confirmed with him at bedside. Counseled him on the risks vs benefits of statin therapy and is agreeable to trying Crestor '5mg'$  three times weekly. Has follow-up through the [place]. -Start Crestor '5mg'$  T/H/Sa  Charlott Rakes, PGY2 Internal Medicine Pager: [phone]

## 2015-09-23 ENCOUNTER — Inpatient Hospital Stay (HOSPITAL_COMMUNITY): Payer: Medicare Other

## 2015-09-23 DIAGNOSIS — I213 ST elevation (STEMI) myocardial infarction of unspecified site: Secondary | ICD-10-CM | POA: Insufficient documentation

## 2015-09-23 DIAGNOSIS — R079 Chest pain, unspecified: Secondary | ICD-10-CM

## 2015-09-23 LAB — COMPREHENSIVE METABOLIC PANEL
ALK PHOS: 52 U/L (ref 38–126)
ALT: 23 U/L (ref 17–63)
ANION GAP: 10 (ref 5–15)
AST: 35 U/L (ref 15–41)
Albumin: 3.3 g/dL — ABNORMAL LOW (ref 3.5–5.0)
BUN: 24 mg/dL — ABNORMAL HIGH (ref 6–20)
CALCIUM: 9.1 mg/dL (ref 8.9–10.3)
CO2: 22 mmol/L (ref 22–32)
Chloride: 106 mmol/L (ref 101–111)
Creatinine, Ser: 1.28 mg/dL — ABNORMAL HIGH (ref 0.61–1.24)
GFR, EST NON AFRICAN AMERICAN: 53 mL/min — AB (ref >60)
Glucose, Bld: 96 mg/dL (ref 65–99)
Potassium: 3.4 mmol/L — ABNORMAL LOW (ref 3.5–5.1)
SODIUM: 138 mmol/L (ref 135–145)
TOTAL PROTEIN: 6.1 g/dL — AB (ref 6.5–8.1)
Total Bilirubin: 0.7 mg/dL (ref 0.3–1.2)

## 2015-09-23 LAB — CBC
HCT: 33 % — ABNORMAL LOW (ref 39.0–52.0)
HEMOGLOBIN: 11.1 g/dL — AB (ref 13.0–17.0)
MCH: 31.1 pg (ref 26.0–34.0)
MCHC: 33.6 g/dL (ref 30.0–36.0)
MCV: 92.4 fL (ref 78.0–100.0)
Platelets: 192 10*3/uL (ref 150–400)
RBC: 3.57 MIL/uL — AB (ref 4.22–5.81)
RDW: 12.4 % (ref 11.5–15.5)
WBC: 6.9 10*3/uL (ref 4.0–10.5)

## 2015-09-23 MED ORDER — TICAGRELOR 90 MG PO TABS: 90.0000 mg | ORAL_TABLET | Freq: Two times a day (BID) | ORAL | Status: DC

## 2015-09-23 MED ORDER — ASPIRIN 81 MG PO TBEC: 81.0000 mg | DELAYED_RELEASE_TABLET | Freq: Every day | ORAL | Status: DC

## 2015-09-23 MED ORDER — LEVOTHYROXINE SODIUM 50 MCG PO TABS
50.0000 ug | ORAL_TABLET | Freq: Every day | ORAL | Status: DC
  Administered 2015-09-23: 50 ug via ORAL
  Filled 2015-09-23: qty 1

## 2015-09-23 MED ORDER — ROSUVASTATIN CALCIUM 5 MG PO TABS: 5.0000 mg | ORAL_TABLET | ORAL | Status: DC

## 2015-09-23 MED ORDER — CARVEDILOL 3.125 MG PO TABS: 3.1250 mg | ORAL_TABLET | Freq: Two times a day (BID) | ORAL | Status: DC

## 2015-09-23 MED ORDER — NITROGLYCERIN 0.4 MG SL SUBL: 0.4000 mg | SUBLINGUAL_TABLET | SUBLINGUAL | Status: AC | PRN

## 2015-09-23 MED ORDER — LISINOPRIL 2.5 MG PO TABS: 2.5000 mg | ORAL_TABLET | Freq: Every day | ORAL | Status: DC

## 2015-09-23 NOTE — Progress Notes (Signed)
Echocardiogram 2D Echocardiogram has been performed.  Gregory Scott 09/23/2015, 1:01 PM

## 2015-09-23 NOTE — Progress Notes (Signed)
CARDIAC REHAB PHASE I   PRE:  Rate/Rhythm: 65 SR  BP:  Supine:   Sitting: 107/72  Standing:    SaO2:   MODE:  Ambulation: 700 ft   POST:  Rate/Rhythm: 73 SR  BP:  Supine:   Sitting: 114/72  Standing:    SaO2:  1230-1249 Pt walked 700 ft with steady gait. No CP, ready for discharge.   Graylon Good, RN BSN  09/23/2015 12:45 PM

## 2015-09-23 NOTE — Progress Notes (Signed)
    Subjective:  Up and walking this morning.   Objective:  Vital Signs in the last 24 hours: Temp:  [97.8 F (36.6 C)-98 F (36.7 C)] 98 F (36.7 C) (11/04 0739) Pulse Rate:  [51-75] 56 (11/04 0739) Resp:  [16-18] 16 (11/04 0739) BP: (84-131)/(42-87) 109/65 mmHg (11/04 0739) SpO2:  [95 %-98 %] 98 % (11/04 0739)  Intake/Output from previous day: 11/03 0701 - 11/04 0700 In: 720 [P.O.:720] Out: -    Physical Exam: Pt is alert and oriented, NAD, lying in bed, head tremor HEENT: normal Neck: No JVD Lungs: CTA bilaterally CV: RRR without murmur or gallop Abd: soft, NT, Positive BS, no hepatomegaly Ext: no C/C/E, distal pulses intact and equal Skin: warm/dry no rash   Lab Results:  Recent Labs  09/22/15 0231 09/23/15 0252  WBC 10.3 6.9  HGB 11.6* 11.1*  PLT 224 192    Recent Labs  09/22/15 0231 09/23/15 0252  NA 138 138  K 3.9 3.4*  CL 109 106  CO2 24 22  GLUCOSE 116* 96  BUN 14 24*  CREATININE 1.12 1.28*    Recent Labs  09/21/15 2055 09/22/15 0847  TROPONINI 9.44* 5.80*    Cardiac Studies: Cath 11/2 Procedures  Mid RCA lesion, 25% stenosed.  Prox LAD lesion, 99% stenosed. Post intervention, there is a 0% residual stenosis.  There is mild left ventricular systolic dysfunction.  1. Critical stenosis of the proximal LAD, treated successfully with a 3.5 mm drug-eluting stent 2. Minor nonobstructive stenosis of the RCA 3. Widely patent left circumflex with no significant stenosis 4. Mild segmental LV systolic dysfunction consistent with the patient's acute anterolateral MI, estimated LVEF 45-50%  Assessment/Plan:  74 year old male with h/o prostate cancer s/p brachytherapy who presented with acute anterolateral STEMI s/p proximal DES to proximal LAD on 11/2.   Anteriolateral STEMI s/p DES: EKG with ST changes in anteriolateral leads on admission. No symptoms currently. Repeat EKG today with T wave inversions in I, aVL and V2-V5, unchanged from  yesterday. -Continue Coreg 3.'125mg'$  twice daily, lisinopril 2.'5mg'$  daily -Continue ASA '81mg'$  daily and Brilinta '90mg'$  BID  Systolic dysfunction: EF 17-49% on cath yesterday. -Echo performed today. Results pending.  Hyperlipidemia: Total cholesterol 272, LDL 198. Intolerance to statins in the past due to myalgias and confirmed with him at bedside. Counseled him on the risks vs benefits of statin therapy and is agreeable to trying Crestor '5mg'$  three times weekly. Has follow-up through the [place]. -Crestor '5mg'$  T/H/Sa  Hypothyroidism: Continue home Synthroid 61mg daily  Emphysema: No wheezing on exam.  -Continue albuterol neb 2.'5mg'$  q6hr prn  RCharlott Rakes PGY2 Internal Medicine Pager: [phone]

## 2015-09-23 NOTE — Discharge Instructions (Signed)

## 2015-09-23 NOTE — Care Management Important Message (Signed)
Important Message  Patient Details  Name: Gregory Scott MRN: [id number] Date of Birth: 10/16/1941   Medicare Important Message Given:  Yes-second notification given    Delorse Lek 09/23/2015, 12:42 PM

## 2015-09-23 NOTE — Discharge Summary (Addendum)
CARDIOLOGY DISCHARGE SUMMARY   Patient ID: Gregory Scott MRN: [id number] DOB/AGE: 1941/11/19 74 y.o.  Admit date: 09/21/2015 Discharge date: 09/23/2015  PCP: No primary care provider on file. Primary Cardiologist: New - Dr. Burt Knack  Primary Discharge Diagnosis:  Acute MI anterior wall first episode care [facility]) Secondary Discharge Diagnosis:  Essential hypertension, Hyperlipidemia, ST elevation myocardial infarction (STEMI) ([place])  Consults: None  Procedures: Transthoracic Echocardiogram, Left Heart Catheterization, Coronary Angiography, Coronary Stent Intervention  Hospital Course: Gregory Scott is a 74 y.o. male with a history of emphysema, HLD   Who presented to [facility] ED on 09/21/2015 for chest pain that had been present on and off for 2 days. He developed severe chest pain that morning associated with substernal chest pain and pain radiating to both arms. His initial EKG showed a lateral MI and CODE STEMI was called. He was taken emergently to the catheterization lab.  Catheterization showed 25% stenosis of the Mid RCA lesion and 99% stenosis of the Proximal LAD lesion. The LAD lesion was treated successfully with a 3.5 mm drug-eluting stent. He was started on DAPT with aspirin and brilinta and will need to be on this regimen for at least 12 months. Was also started on Coreg 3.'125mg'$  BID.  Overnight, he denied any recurrence of chest pain or other anginal symptoms. Cyclic troponin values were 7.01, 9.44, and 5.80. He was able to ambulate over 700 ft with cardiac rehab without any symptoms. His LDL was noted to be elevated to 198. He reported a history of statin intolerance but he agreed to try Rosuvastatin '5mg'$  on a MWF schedule to help with his LDL level. He was also started on Lisinopril 2.'5mg'$  daily.  He continued to do well on the morning of 09/23/2015 and ambulated another 79f with cardiac rehab without any issues. An echocardiogram on 09/23/2015 was obtained and showed an  EF of 65% to 70% with no regional wall motion abnormalities. Grade 1 diastolic dysfunction was noted.  The patient was last examined by Dr. ROval Linseyand deemed stable for discharge. He wishes to follow-up with Gregory Scott(his wife's cardiologist) and has scheduled follow-up for 09/26/2015. Prescriptions were sent in to the patient's pharmacy for his new medications started this admission. He was given a printed Rx for Brilinta to use with his coupon along with an additional Brilinta Rx with refills.  Labs:   Lab Results  Component Value Date   WBC 6.9 09/23/2015   HGB 11.1* 09/23/2015   HCT 33.0* 09/23/2015   MCV 92.4 09/23/2015   PLT 192 09/23/2015     Recent Labs Lab 09/23/15 0252  NA 138  K 3.4*  CL 106  CO2 22  BUN 24*  CREATININE 1.28*  CALCIUM 9.1  PROT 6.1*  BILITOT 0.7  ALKPHOS 52  ALT 23  AST 35  GLUCOSE 96    Recent Labs  09/21/15 1506 09/21/15 2055 09/22/15 0847  TROPONINI 7.01* 9.44* 5.80*   Lipid Panel     Component Value Date/Time   CHOL 272* 09/22/2015 0231   TRIG 171* 09/22/2015 0231   HDL 40* 09/22/2015 0231   CHOLHDL 6.8 09/22/2015 0231   VLDL 34 09/22/2015 0231   LDLCALC 198* 09/22/2015 0231    B NATRIURETIC PEPTIDE  Date/Time Value Ref Range Status  09/21/2015 10:00 AM 118.0* 0.0 - 100.0 pg/mL Final    Recent Labs  09/21/15 0723  INR 1.05       Cardiac Cath: 09/21/2015  Mid RCA lesion, 25%  stenosed.  Prox LAD lesion, 99% stenosed. Post intervention, there is a 0% residual stenosis.  There is mild left ventricular systolic dysfunction.  1. Critical stenosis of the proximal LAD, treated successfully with a 3.5 mm drug-eluting stent 2. Minor nonobstructive stenosis of the RCA 3. Widely patent left circumflex with no significant stenosis 4. Mild segmental LV systolic dysfunction consistent with the patient's acute anterolateral MI, estimated LVEF 45-50%  Recommendations: The patient will be transferred to the CCU. He should  continue dual antiplatelet therapy with aspirin and brilinta for at least 12 months. He has predictors for a favorable prognosis with rapid resolution of ST segment changes following reperfusion. Will check an echocardiogram in 48 hours. He may be a candidate for 48 hour discharge if he is doing well clinically. Routine post MI medical therapy should be instituted. Note the patient is unable to take statin drugs. He has had severe myalgias and is unable to tolerate them even at low doses.  Echo: 09/23/2015 Study Conclusions - Left ventricle: The cavity size was normal. Wall thickness was normal. Systolic function was vigorous. The estimated ejection fraction was in the range of 65% to 70%. Wall motion was normal; there were no regional wall motion abnormalities. Doppler parameters are consistent with abnormal left ventricular relaxation (grade 1 diastolic dysfunction).  FOLLOW UP PLANS AND APPOINTMENTS Allergies  Allergen Reactions  . Statins     Legs hurt     Medication List    STOP taking these medications        diclofenac 50 MG EC tablet  Commonly known as:  VOLTAREN      TAKE these medications        acetaminophen-codeine 300-30 MG tablet  Commonly known as:  TYLENOL #3  Take 1 tablet by mouth daily as needed.     aspirin 81 MG EC tablet  Take 1 tablet (81 mg total) by mouth daily.     carvedilol 3.125 MG tablet  Commonly known as:  COREG  Take 1 tablet (3.125 mg total) by mouth 2 (two) times daily with a meal.     gabapentin 300 MG capsule  Commonly known as:  NEURONTIN  Take 300 mg by mouth at bedtime.     LEVOTHYROXINE SODIUM PO  Take 0.05 mg by mouth daily.     lisinopril 2.5 MG tablet  Commonly known as:  PRINIVIL,ZESTRIL  Take 1 tablet (2.5 mg total) by mouth daily.     nitroGLYCERIN 0.4 MG SL tablet  Commonly known as:  NITROSTAT  Place 1 tablet (0.4 mg total) under the tongue every 5 (five) minutes x 3 doses as needed for chest pain.      prazosin 1 MG capsule  Commonly known as:  MINIPRESS  Take 1 mg by mouth at bedtime.     PROVENTIL HFA 108 (90 BASE) MCG/ACT inhaler  Generic drug:  albuterol  Take 1-2 puffs by mouth every 6 (six) hours as needed.     rOPINIRole 0.25 MG tablet  Commonly known as:  REQUIP  Take 0.25-0.5 mg by mouth 3 (three) times daily. Take 1 tablet in the morning, and 1 tablet at noon. Take 2 tablets at bedtime     rosuvastatin 5 MG tablet  Commonly known as:  CRESTOR  Take 1 tablet (5 mg total) by mouth 3 (three) times a week.     sertraline 50 MG tablet  Commonly known as:  ZOLOFT  Take 50 mg by mouth daily.     ticagrelor  90 MG Tabs tablet  Commonly known as:  BRILINTA  Take 1 tablet (90 mg total) by mouth 2 (two) times daily.        Discharge Instructions    Amb Referral to Cardiac Rehabilitation    Complete by:  As directed   Diagnosis:   PCI Myocardial Infarction            Follow-up Information    Follow up with Gregory Forward, Gregory Scott.   Specialty:  Cardiology   Why:  Follow Up as Scheduled.   Contact information:   [address] [address] [phone]       BRING ALL MEDICATIONS WITH YOU TO FOLLOW UP APPOINTMENTS  Time spent with patient to include physician time: 40 minutes Signed: Erma Heritage, PA 09/23/2015, 2:05 PM Co-Sign Gregory Scott    History and all data above reviewed. Patient examined. I agree with the findings as above. The patient exam reveals:  Gen: Well-appearing. NAD.  Neck: No JVD COR: RRR. No m/r/g. Nl S1/S2.  Lungs: Mild R basilar crackles. No rhonchi or wheezes  Abd: Soft, NT, ND. +BS Ext WWP. R radial without ecchymosis.   All available labs, radiology testing, previous records reviewed. Agree with documented assessment and plan. Gregory Scott is doing well. He has been walking and has no chest pain or shortness of breath. Echo has been completed but not reviewed yet. Plan for ASA + ticagrelor for at  least 1 year. He will need follow up with Dr. Terrence Scott in 2 weeks.   Sharol Harness, Gregory Scott  09/23/2015

## 2015-09-26 DIAGNOSIS — C61 Malignant neoplasm of prostate: Secondary | ICD-10-CM | POA: Diagnosis not present

## 2015-09-26 DIAGNOSIS — E039 Hypothyroidism, unspecified: Secondary | ICD-10-CM | POA: Diagnosis not present

## 2015-09-26 DIAGNOSIS — E785 Hyperlipidemia, unspecified: Secondary | ICD-10-CM | POA: Diagnosis not present

## 2015-09-26 DIAGNOSIS — I2109 ST elevation (STEMI) myocardial infarction involving other coronary artery of anterior wall: Secondary | ICD-10-CM | POA: Diagnosis not present

## 2015-09-26 DIAGNOSIS — M1990 Unspecified osteoarthritis, unspecified site: Secondary | ICD-10-CM | POA: Diagnosis not present

## 2015-09-26 DIAGNOSIS — I10 Essential (primary) hypertension: Secondary | ICD-10-CM | POA: Diagnosis not present

## 2015-09-26 DIAGNOSIS — I2510 Atherosclerotic heart disease of native coronary artery without angina pectoris: Secondary | ICD-10-CM | POA: Diagnosis not present

## 2015-09-26 DIAGNOSIS — F4310 Post-traumatic stress disorder, unspecified: Secondary | ICD-10-CM | POA: Diagnosis not present

## 2015-09-26 DIAGNOSIS — J449 Chronic obstructive pulmonary disease, unspecified: Secondary | ICD-10-CM | POA: Diagnosis not present

## 2015-10-04 DIAGNOSIS — M7021 Olecranon bursitis, right elbow: Secondary | ICD-10-CM | POA: Diagnosis not present

## 2015-10-17 DIAGNOSIS — E039 Hypothyroidism, unspecified: Secondary | ICD-10-CM | POA: Diagnosis not present

## 2015-10-17 DIAGNOSIS — I10 Essential (primary) hypertension: Secondary | ICD-10-CM | POA: Diagnosis not present

## 2015-10-17 DIAGNOSIS — M1990 Unspecified osteoarthritis, unspecified site: Secondary | ICD-10-CM | POA: Diagnosis not present

## 2015-10-17 DIAGNOSIS — F4310 Post-traumatic stress disorder, unspecified: Secondary | ICD-10-CM | POA: Diagnosis not present

## 2015-10-17 DIAGNOSIS — I252 Old myocardial infarction: Secondary | ICD-10-CM | POA: Diagnosis not present

## 2015-10-17 DIAGNOSIS — I2510 Atherosclerotic heart disease of native coronary artery without angina pectoris: Secondary | ICD-10-CM | POA: Diagnosis not present

## 2015-10-17 DIAGNOSIS — E785 Hyperlipidemia, unspecified: Secondary | ICD-10-CM | POA: Diagnosis not present

## 2015-10-17 DIAGNOSIS — C3490 Malignant neoplasm of unspecified part of unspecified bronchus or lung: Secondary | ICD-10-CM | POA: Diagnosis not present

## 2015-10-17 DIAGNOSIS — C61 Malignant neoplasm of prostate: Secondary | ICD-10-CM | POA: Diagnosis not present

## 2015-10-17 DIAGNOSIS — J449 Chronic obstructive pulmonary disease, unspecified: Secondary | ICD-10-CM | POA: Diagnosis not present

## 2015-11-03 ENCOUNTER — Encounter (HOSPITAL_COMMUNITY)
Admission: RE | Admit: 2015-11-03 | Discharge: 2015-11-03 | Disposition: A | Discharge status: Home / Self Care | Payer: Medicare Other | Source: Ambulatory Visit | Attending: Cardiovascular Disease | Admitting: Cardiovascular Disease

## 2015-11-03 DIAGNOSIS — I213 ST elevation (STEMI) myocardial infarction of unspecified site: Secondary | ICD-10-CM | POA: Insufficient documentation

## 2015-11-03 DIAGNOSIS — Z955 Presence of coronary angioplasty implant and graft: Secondary | ICD-10-CM | POA: Insufficient documentation

## 2015-11-03 NOTE — Progress Notes (Addendum)
Cardiac Rehab Medication Review by a Pharmacist  Does the patient  feel that his/her medications are working for him/her?  yes  Has the patient been experiencing any side effects to the medications prescribed?  Yes Legs are hurting from Rosuvastatin   Does the patient measure his/her own blood pressure or blood glucose at home?  yes   Does the patient have any problems obtaining medications due to transportation or finances?   no  Understanding of regimen: excellent Understanding of indications: excellent Potential of compliance: excellent    Pharmacist comments: Pt had no adherence or cost barriers and had a good understanding of his medication regimen.  Pt did have continued muscle pain in his legs from his 3x/week crestor.  Instructed pt to discuss this with his MD if the pain continues to be bothersome.     Lowella Grip E Stanely Sexson 11/03/2015 8:25 AM

## 2015-11-07 ENCOUNTER — Encounter (HOSPITAL_COMMUNITY)
Admission: RE | Admit: 2015-11-07 | Discharge: 2015-11-07 | Disposition: A | Discharge status: Home / Self Care | Payer: Medicare Other | Source: Ambulatory Visit | Attending: Cardiovascular Disease | Admitting: Cardiovascular Disease

## 2015-11-07 DIAGNOSIS — I213 ST elevation (STEMI) myocardial infarction of unspecified site: Secondary | ICD-10-CM | POA: Diagnosis not present

## 2015-11-07 DIAGNOSIS — Z955 Presence of coronary angioplasty implant and graft: Secondary | ICD-10-CM | POA: Diagnosis not present

## 2015-11-07 NOTE — Progress Notes (Signed)
Pt started cardiac rehab today.  Pt tolerated light exercise without difficulty. VSS, telemetry-Sinus rhythm, asymptomatic.  Medication list reconciled.  Pt verbalized compliance with medications and denies barriers to compliance. PSYCHOSOCIAL ASSESSMENT:  PHQ-0. Pt exhibits positive coping skills, hopeful outlook with supportive family. No psychosocial needs identified at this time, no psychosocial interventions necessary. Macallister does have a history of depression and Post traumatic stress disorder. Mr Witherspoon is taking an antidepressant and denies being depressed at this time> Mr Deirdre Evener says he see's a psychologist at the [place].   Pt enjoys doing things around the house.Marland Kitchen   Pt cardiac rehab  goal is  to get stronger.  Pt encouraged to participate in exercising on your own and functional fitness  to increase ability to achieve these goals.   Pt long term cardiac rehab goal is get stronger.  Pt oriented to exercise equipment and routine.  Understanding verbalized. Mr Needle was noted to have a fine tremor. Mr Clauson says the tremor is an effect of serving in [place] and being exposed to agent orange. Will continue to monitor the patient throughout  the program.

## 2015-11-09 ENCOUNTER — Encounter (HOSPITAL_COMMUNITY)
Admission: RE | Admit: 2015-11-09 | Discharge: 2015-11-09 | Disposition: A | Discharge status: Home / Self Care | Payer: Medicare Other | Source: Ambulatory Visit | Attending: Cardiovascular Disease | Admitting: Cardiovascular Disease

## 2015-11-09 DIAGNOSIS — Z955 Presence of coronary angioplasty implant and graft: Secondary | ICD-10-CM | POA: Diagnosis not present

## 2015-11-09 DIAGNOSIS — I213 ST elevation (STEMI) myocardial infarction of unspecified site: Secondary | ICD-10-CM | POA: Diagnosis not present

## 2015-11-11 ENCOUNTER — Encounter (HOSPITAL_COMMUNITY)
Admission: RE | Admit: 2015-11-11 | Discharge: 2015-11-11 | Disposition: A | Discharge status: Home / Self Care | Payer: Medicare Other | Source: Ambulatory Visit | Attending: Cardiovascular Disease | Admitting: Cardiovascular Disease

## 2015-11-11 DIAGNOSIS — Z955 Presence of coronary angioplasty implant and graft: Secondary | ICD-10-CM | POA: Diagnosis not present

## 2015-11-11 DIAGNOSIS — I213 ST elevation (STEMI) myocardial infarction of unspecified site: Secondary | ICD-10-CM | POA: Diagnosis not present

## 2015-11-16 ENCOUNTER — Encounter (HOSPITAL_COMMUNITY)
Admission: RE | Admit: 2015-11-16 | Discharge: 2015-11-16 | Disposition: A | Discharge status: Home / Self Care | Payer: Medicare Other | Source: Ambulatory Visit | Attending: Cardiovascular Disease | Admitting: Cardiovascular Disease

## 2015-11-16 DIAGNOSIS — I213 ST elevation (STEMI) myocardial infarction of unspecified site: Secondary | ICD-10-CM | POA: Diagnosis not present

## 2015-11-16 DIAGNOSIS — Z955 Presence of coronary angioplasty implant and graft: Secondary | ICD-10-CM | POA: Diagnosis not present

## 2015-11-18 ENCOUNTER — Encounter (HOSPITAL_COMMUNITY)
Admission: RE | Admit: 2015-11-18 | Discharge: 2015-11-18 | Disposition: A | Discharge status: Home / Self Care | Payer: Medicare Other | Source: Ambulatory Visit | Attending: Cardiovascular Disease | Admitting: Cardiovascular Disease

## 2015-11-18 DIAGNOSIS — I213 ST elevation (STEMI) myocardial infarction of unspecified site: Secondary | ICD-10-CM | POA: Diagnosis not present

## 2015-11-18 DIAGNOSIS — Z955 Presence of coronary angioplasty implant and graft: Secondary | ICD-10-CM | POA: Diagnosis not present

## 2015-11-23 ENCOUNTER — Encounter (HOSPITAL_COMMUNITY)
Admission: RE | Admit: 2015-11-23 | Discharge: 2015-11-23 | Disposition: A | Discharge status: Home / Self Care | Payer: Medicare Other | Source: Ambulatory Visit | Attending: Cardiovascular Disease | Admitting: Cardiovascular Disease

## 2015-11-23 DIAGNOSIS — Z955 Presence of coronary angioplasty implant and graft: Secondary | ICD-10-CM | POA: Diagnosis not present

## 2015-11-23 DIAGNOSIS — I213 ST elevation (STEMI) myocardial infarction of unspecified site: Secondary | ICD-10-CM | POA: Diagnosis not present

## 2015-11-25 ENCOUNTER — Encounter (HOSPITAL_COMMUNITY)
Admission: RE | Admit: 2015-11-25 | Discharge: 2015-11-25 | Disposition: A | Discharge status: Home / Self Care | Payer: Medicare Other | Source: Ambulatory Visit | Attending: Cardiovascular Disease | Admitting: Cardiovascular Disease

## 2015-11-25 DIAGNOSIS — Z955 Presence of coronary angioplasty implant and graft: Secondary | ICD-10-CM | POA: Diagnosis not present

## 2015-11-25 DIAGNOSIS — I213 ST elevation (STEMI) myocardial infarction of unspecified site: Secondary | ICD-10-CM | POA: Diagnosis not present

## 2015-11-25 NOTE — Progress Notes (Signed)
Gregory Scott reports that he sometimes feels lightheaded when he gets up in the morning. Orthostatic blood pressures checked. Lying blood pressure 118/62, sitting blood pressure 116/61, standing blood pressure 101/59. Gregory Scott denied feeling lightheaded today at cardiac rehab with medication changes. Dr Terrence Dupont called and notified. Dr Terrence Dupont told the patient to check his blood pressures at home and to keep a blood pressure log. Patient also instructed to make sure he is drinking enough water. Patient states understanding. QUALITY OF LIFE SCORE REVIEW  Pt completed Quality of Life survey as a participant in Cardiac Rehab. Scores 21.0 or below are considered low. Pt score was low in , Health and Function 20.50  Patient quality of life slightly altered by physical constraints which limits ability to perform as prior to recent cardiac illness. Gregory Scott says he sometimes experiences shortness of breath. Gregory Scott also mentioned that he has been dealing with depression and that his depressed is controlled with the antidepressant he is taking. Gregory Scott also sees a Teacher, music at the [place]. Offered emotional support and reassurance.  Will continue to monitor and intervene as necessary.  Will fax exercise flow sheets to Dr. Zenia Resides office for review. Will continue to monitor the patient throughout  the program.

## 2015-11-25 NOTE — Progress Notes (Signed)
Reviewed home exercise guidelines with patient including endpoints, temperature precautions, target heart rate and rate of perceived exertion. Pt plans to walk as his mode of home exercise. Patient also has a treadmill at home that he previously used and may start using again. Pt voices understanding of instructions given.

## 2015-11-28 ENCOUNTER — Encounter (HOSPITAL_COMMUNITY)
Admission: RE | Admit: 2015-11-28 | Discharge: 2015-11-28 | Disposition: A | Discharge status: Home / Self Care | Payer: Medicare Other | Source: Ambulatory Visit | Attending: Cardiovascular Disease | Admitting: Cardiovascular Disease

## 2015-11-28 DIAGNOSIS — Z955 Presence of coronary angioplasty implant and graft: Secondary | ICD-10-CM | POA: Diagnosis not present

## 2015-11-28 DIAGNOSIS — I213 ST elevation (STEMI) myocardial infarction of unspecified site: Secondary | ICD-10-CM | POA: Diagnosis not present

## 2015-11-30 ENCOUNTER — Encounter (HOSPITAL_COMMUNITY)
Admission: RE | Admit: 2015-11-30 | Discharge: 2015-11-30 | Disposition: A | Discharge status: Home / Self Care | Payer: Medicare Other | Source: Ambulatory Visit | Attending: Cardiovascular Disease | Admitting: Cardiovascular Disease

## 2015-11-30 DIAGNOSIS — Z955 Presence of coronary angioplasty implant and graft: Secondary | ICD-10-CM | POA: Diagnosis not present

## 2015-11-30 DIAGNOSIS — I213 ST elevation (STEMI) myocardial infarction of unspecified site: Secondary | ICD-10-CM | POA: Diagnosis not present

## 2015-12-02 ENCOUNTER — Encounter (HOSPITAL_COMMUNITY)
Admission: RE | Admit: 2015-12-02 | Discharge: 2015-12-02 | Disposition: A | Discharge status: Home / Self Care | Payer: Medicare Other | Source: Ambulatory Visit | Attending: Cardiovascular Disease | Admitting: Cardiovascular Disease

## 2015-12-02 DIAGNOSIS — I213 ST elevation (STEMI) myocardial infarction of unspecified site: Secondary | ICD-10-CM | POA: Diagnosis not present

## 2015-12-02 DIAGNOSIS — Z955 Presence of coronary angioplasty implant and graft: Secondary | ICD-10-CM | POA: Diagnosis not present

## 2015-12-05 ENCOUNTER — Encounter (HOSPITAL_COMMUNITY)
Admission: RE | Admit: 2015-12-05 | Discharge: 2015-12-05 | Disposition: A | Discharge status: Home / Self Care | Payer: Medicare Other | Source: Ambulatory Visit | Attending: Cardiovascular Disease | Admitting: Cardiovascular Disease

## 2015-12-05 DIAGNOSIS — Z955 Presence of coronary angioplasty implant and graft: Secondary | ICD-10-CM | POA: Diagnosis not present

## 2015-12-05 DIAGNOSIS — I213 ST elevation (STEMI) myocardial infarction of unspecified site: Secondary | ICD-10-CM | POA: Diagnosis not present

## 2015-12-07 ENCOUNTER — Encounter (HOSPITAL_COMMUNITY)
Admission: RE | Admit: 2015-12-07 | Discharge: 2015-12-07 | Disposition: A | Discharge status: Home / Self Care | Payer: Medicare Other | Source: Ambulatory Visit | Attending: Cardiovascular Disease | Admitting: Cardiovascular Disease

## 2015-12-07 DIAGNOSIS — Z955 Presence of coronary angioplasty implant and graft: Secondary | ICD-10-CM | POA: Diagnosis not present

## 2015-12-07 DIAGNOSIS — I213 ST elevation (STEMI) myocardial infarction of unspecified site: Secondary | ICD-10-CM | POA: Diagnosis not present

## 2015-12-09 ENCOUNTER — Encounter (HOSPITAL_COMMUNITY)
Admission: RE | Admit: 2015-12-09 | Discharge: 2015-12-09 | Disposition: A | Discharge status: Home / Self Care | Payer: Medicare Other | Source: Ambulatory Visit | Attending: Cardiovascular Disease | Admitting: Cardiovascular Disease

## 2015-12-09 DIAGNOSIS — I213 ST elevation (STEMI) myocardial infarction of unspecified site: Secondary | ICD-10-CM | POA: Diagnosis not present

## 2015-12-09 DIAGNOSIS — Z955 Presence of coronary angioplasty implant and graft: Secondary | ICD-10-CM | POA: Diagnosis not present

## 2015-12-12 ENCOUNTER — Encounter (HOSPITAL_COMMUNITY)
Admission: RE | Admit: 2015-12-12 | Discharge: 2015-12-12 | Disposition: A | Discharge status: Home / Self Care | Payer: Medicare Other | Source: Ambulatory Visit | Attending: Cardiovascular Disease | Admitting: Cardiovascular Disease

## 2015-12-12 DIAGNOSIS — Z955 Presence of coronary angioplasty implant and graft: Secondary | ICD-10-CM | POA: Diagnosis not present

## 2015-12-12 DIAGNOSIS — I213 ST elevation (STEMI) myocardial infarction of unspecified site: Secondary | ICD-10-CM | POA: Diagnosis not present

## 2015-12-14 ENCOUNTER — Encounter (HOSPITAL_COMMUNITY)
Admission: RE | Admit: 2015-12-14 | Discharge: 2015-12-14 | Disposition: A | Discharge status: Home / Self Care | Payer: Medicare Other | Source: Ambulatory Visit | Attending: Cardiovascular Disease | Admitting: Cardiovascular Disease

## 2015-12-14 DIAGNOSIS — Z955 Presence of coronary angioplasty implant and graft: Secondary | ICD-10-CM | POA: Diagnosis not present

## 2015-12-14 DIAGNOSIS — I213 ST elevation (STEMI) myocardial infarction of unspecified site: Secondary | ICD-10-CM | POA: Diagnosis not present

## 2015-12-14 NOTE — Progress Notes (Signed)
Gregory Scott 75 y.o. male Nutrition Note Spoke with pt.  Nutrition Survey reviewed with pt. Pt is following Step 1 of the Therapeutic Lifestyle Changes diet. Pt wants to lose wt. Wt loss tips reviewed. Pt is pre-diabetic according to the last A1c. Pt states he has a strong family h/o DM. Pt aware of pre-diabetes dx. Pt encouraged to discuss pre-diabetes and any further screening with his PCP. Pt expressed understanding of the information reviewed. Pt aware of nutrition education classes offered and plans on attending nutrition classes. Lab Results  Component Value Date   HGBA1C 5.7* 09/21/2015   Wt Readings from Last 3 Encounters:  11/03/15 171 lb 8.3 oz (77.8 kg)  09/21/15 170 lb 13.7 oz (77.5 kg)   Nutrition Diagnosis ? Food-and nutrition-related knowledge deficit related to lack of exposure to information as related to diagnosis of: ? CVD ? Pre-DM ? Overweight related to excessive energy intake as evidenced by a BMI of 25.3  Nutrition Intervention ? Benefits of adopting Therapeutic Lifestyle Changes discussed when Medficts reviewed. ? Pt to attend the Portion Distortion class ? Pt to attend the  ? Nutrition I class                        ? Nutrition II class ? Continue client-centered nutrition education by RD, as part of interdisciplinary care.  Goal(s) ? Pt to identify and limit food sources of saturated fat, trans fat, and sodium ? Pt to identify food quantities necessary to achieve: ? wt loss to a goal wt of 6-24 lb (2.7-10.9 kg) at graduation from cardiac rehab.   Monitor and Evaluate progress toward nutrition goal with team.  Derek Mound, M.Ed, RD, LDN, CDE 12/14/2015 3:53 PM

## 2015-12-16 ENCOUNTER — Encounter (HOSPITAL_COMMUNITY)
Admission: RE | Admit: 2015-12-16 | Discharge: 2015-12-16 | Disposition: A | Discharge status: Home / Self Care | Payer: Medicare Other | Source: Ambulatory Visit | Attending: Cardiovascular Disease | Admitting: Cardiovascular Disease

## 2015-12-16 DIAGNOSIS — I213 ST elevation (STEMI) myocardial infarction of unspecified site: Secondary | ICD-10-CM | POA: Diagnosis not present

## 2015-12-16 DIAGNOSIS — Z955 Presence of coronary angioplasty implant and graft: Secondary | ICD-10-CM | POA: Diagnosis not present

## 2015-12-19 ENCOUNTER — Encounter (HOSPITAL_COMMUNITY)
Admission: RE | Admit: 2015-12-19 | Discharge: 2015-12-19 | Disposition: A | Discharge status: Home / Self Care | Payer: Medicare Other | Source: Ambulatory Visit | Attending: Cardiovascular Disease | Admitting: Cardiovascular Disease

## 2015-12-19 DIAGNOSIS — I213 ST elevation (STEMI) myocardial infarction of unspecified site: Secondary | ICD-10-CM | POA: Diagnosis not present

## 2015-12-19 DIAGNOSIS — Z955 Presence of coronary angioplasty implant and graft: Secondary | ICD-10-CM | POA: Diagnosis not present

## 2015-12-21 ENCOUNTER — Encounter (HOSPITAL_COMMUNITY)
Admission: RE | Admit: 2015-12-21 | Discharge: 2015-12-21 | Disposition: A | Discharge status: Home / Self Care | Payer: Medicare Other | Source: Ambulatory Visit | Attending: Cardiovascular Disease | Admitting: Cardiovascular Disease

## 2015-12-21 DIAGNOSIS — Z955 Presence of coronary angioplasty implant and graft: Secondary | ICD-10-CM | POA: Insufficient documentation

## 2015-12-21 DIAGNOSIS — I213 ST elevation (STEMI) myocardial infarction of unspecified site: Secondary | ICD-10-CM | POA: Diagnosis not present

## 2015-12-22 DIAGNOSIS — C61 Malignant neoplasm of prostate: Secondary | ICD-10-CM | POA: Diagnosis not present

## 2015-12-23 ENCOUNTER — Encounter (HOSPITAL_COMMUNITY)
Admission: RE | Admit: 2015-12-23 | Discharge: 2015-12-23 | Disposition: A | Discharge status: Home / Self Care | Payer: Medicare Other | Source: Ambulatory Visit | Attending: Cardiovascular Disease | Admitting: Cardiovascular Disease

## 2015-12-23 DIAGNOSIS — I213 ST elevation (STEMI) myocardial infarction of unspecified site: Secondary | ICD-10-CM | POA: Diagnosis not present

## 2015-12-23 DIAGNOSIS — Z955 Presence of coronary angioplasty implant and graft: Secondary | ICD-10-CM | POA: Diagnosis not present

## 2015-12-26 ENCOUNTER — Encounter (HOSPITAL_COMMUNITY)
Admission: RE | Admit: 2015-12-26 | Discharge: 2015-12-26 | Disposition: A | Discharge status: Home / Self Care | Payer: Medicare Other | Source: Ambulatory Visit | Attending: Cardiovascular Disease | Admitting: Cardiovascular Disease

## 2015-12-26 DIAGNOSIS — E039 Hypothyroidism, unspecified: Secondary | ICD-10-CM | POA: Diagnosis not present

## 2015-12-26 DIAGNOSIS — C61 Malignant neoplasm of prostate: Secondary | ICD-10-CM | POA: Diagnosis not present

## 2015-12-26 DIAGNOSIS — I2510 Atherosclerotic heart disease of native coronary artery without angina pectoris: Secondary | ICD-10-CM | POA: Diagnosis not present

## 2015-12-26 DIAGNOSIS — C3490 Malignant neoplasm of unspecified part of unspecified bronchus or lung: Secondary | ICD-10-CM | POA: Diagnosis not present

## 2015-12-26 DIAGNOSIS — Z955 Presence of coronary angioplasty implant and graft: Secondary | ICD-10-CM | POA: Diagnosis not present

## 2015-12-26 DIAGNOSIS — E785 Hyperlipidemia, unspecified: Secondary | ICD-10-CM | POA: Diagnosis not present

## 2015-12-26 DIAGNOSIS — I10 Essential (primary) hypertension: Secondary | ICD-10-CM | POA: Diagnosis not present

## 2015-12-26 DIAGNOSIS — I252 Old myocardial infarction: Secondary | ICD-10-CM | POA: Diagnosis not present

## 2015-12-26 DIAGNOSIS — I213 ST elevation (STEMI) myocardial infarction of unspecified site: Secondary | ICD-10-CM | POA: Diagnosis not present

## 2015-12-26 DIAGNOSIS — F4310 Post-traumatic stress disorder, unspecified: Secondary | ICD-10-CM | POA: Diagnosis not present

## 2015-12-26 DIAGNOSIS — J449 Chronic obstructive pulmonary disease, unspecified: Secondary | ICD-10-CM | POA: Diagnosis not present

## 2015-12-28 ENCOUNTER — Encounter (HOSPITAL_COMMUNITY)
Admission: RE | Admit: 2015-12-28 | Discharge: 2015-12-28 | Disposition: A | Discharge status: Home / Self Care | Payer: Medicare Other | Source: Ambulatory Visit | Attending: Cardiovascular Disease | Admitting: Cardiovascular Disease

## 2015-12-28 DIAGNOSIS — I213 ST elevation (STEMI) myocardial infarction of unspecified site: Secondary | ICD-10-CM | POA: Diagnosis not present

## 2015-12-28 DIAGNOSIS — Z955 Presence of coronary angioplasty implant and graft: Secondary | ICD-10-CM | POA: Diagnosis not present

## 2015-12-30 ENCOUNTER — Encounter (HOSPITAL_COMMUNITY)
Admission: RE | Admit: 2015-12-30 | Discharge: 2015-12-30 | Disposition: A | Discharge status: Home / Self Care | Payer: Medicare Other | Source: Ambulatory Visit | Attending: Cardiovascular Disease | Admitting: Cardiovascular Disease

## 2015-12-30 DIAGNOSIS — I213 ST elevation (STEMI) myocardial infarction of unspecified site: Secondary | ICD-10-CM | POA: Diagnosis not present

## 2015-12-30 DIAGNOSIS — Z955 Presence of coronary angioplasty implant and graft: Secondary | ICD-10-CM | POA: Diagnosis not present

## 2016-01-02 ENCOUNTER — Encounter (HOSPITAL_COMMUNITY)
Admission: RE | Admit: 2016-01-02 | Discharge: 2016-01-02 | Disposition: A | Discharge status: Home / Self Care | Payer: Medicare Other | Source: Ambulatory Visit | Attending: Cardiovascular Disease | Admitting: Cardiovascular Disease

## 2016-01-02 DIAGNOSIS — Z955 Presence of coronary angioplasty implant and graft: Secondary | ICD-10-CM | POA: Diagnosis not present

## 2016-01-02 DIAGNOSIS — I213 ST elevation (STEMI) myocardial infarction of unspecified site: Secondary | ICD-10-CM | POA: Diagnosis not present

## 2016-01-04 ENCOUNTER — Encounter (HOSPITAL_COMMUNITY)
Admission: RE | Admit: 2016-01-04 | Discharge: 2016-01-04 | Disposition: A | Discharge status: Home / Self Care | Payer: Medicare Other | Source: Ambulatory Visit | Attending: Cardiovascular Disease | Admitting: Cardiovascular Disease

## 2016-01-04 DIAGNOSIS — Z955 Presence of coronary angioplasty implant and graft: Secondary | ICD-10-CM | POA: Diagnosis not present

## 2016-01-04 DIAGNOSIS — I213 ST elevation (STEMI) myocardial infarction of unspecified site: Secondary | ICD-10-CM | POA: Diagnosis not present

## 2016-01-06 ENCOUNTER — Encounter (HOSPITAL_COMMUNITY)
Admission: RE | Admit: 2016-01-06 | Discharge: 2016-01-06 | Disposition: A | Discharge status: Home / Self Care | Payer: Medicare Other | Source: Ambulatory Visit | Attending: Cardiovascular Disease | Admitting: Cardiovascular Disease

## 2016-01-06 DIAGNOSIS — I213 ST elevation (STEMI) myocardial infarction of unspecified site: Secondary | ICD-10-CM | POA: Diagnosis not present

## 2016-01-06 DIAGNOSIS — Z955 Presence of coronary angioplasty implant and graft: Secondary | ICD-10-CM | POA: Diagnosis not present

## 2016-01-09 ENCOUNTER — Encounter (HOSPITAL_COMMUNITY)
Admission: RE | Admit: 2016-01-09 | Discharge: 2016-01-09 | Disposition: A | Discharge status: Home / Self Care | Payer: Medicare Other | Source: Ambulatory Visit | Attending: Cardiovascular Disease | Admitting: Cardiovascular Disease

## 2016-01-09 DIAGNOSIS — Z955 Presence of coronary angioplasty implant and graft: Secondary | ICD-10-CM | POA: Diagnosis not present

## 2016-01-09 DIAGNOSIS — I213 ST elevation (STEMI) myocardial infarction of unspecified site: Secondary | ICD-10-CM | POA: Diagnosis not present

## 2016-01-11 ENCOUNTER — Encounter (HOSPITAL_COMMUNITY)
Admission: RE | Admit: 2016-01-11 | Discharge: 2016-01-11 | Disposition: A | Discharge status: Home / Self Care | Payer: Medicare Other | Source: Ambulatory Visit | Attending: Cardiovascular Disease | Admitting: Cardiovascular Disease

## 2016-01-11 DIAGNOSIS — I213 ST elevation (STEMI) myocardial infarction of unspecified site: Secondary | ICD-10-CM | POA: Diagnosis not present

## 2016-01-11 DIAGNOSIS — Z955 Presence of coronary angioplasty implant and graft: Secondary | ICD-10-CM | POA: Diagnosis not present

## 2016-01-13 ENCOUNTER — Encounter (HOSPITAL_COMMUNITY)
Admission: RE | Admit: 2016-01-13 | Discharge: 2016-01-13 | Disposition: A | Discharge status: Home / Self Care | Payer: Medicare Other | Source: Ambulatory Visit | Attending: Cardiovascular Disease | Admitting: Cardiovascular Disease

## 2016-01-13 DIAGNOSIS — Z955 Presence of coronary angioplasty implant and graft: Secondary | ICD-10-CM | POA: Diagnosis not present

## 2016-01-13 DIAGNOSIS — I213 ST elevation (STEMI) myocardial infarction of unspecified site: Secondary | ICD-10-CM | POA: Diagnosis not present

## 2016-01-16 ENCOUNTER — Encounter (HOSPITAL_COMMUNITY): Payer: Medicare Other

## 2016-01-18 ENCOUNTER — Encounter (HOSPITAL_COMMUNITY)
Admission: RE | Admit: 2016-01-18 | Discharge: 2016-01-18 | Disposition: A | Discharge status: Home / Self Care | Payer: Medicare Other | Source: Ambulatory Visit | Attending: Cardiovascular Disease | Admitting: Cardiovascular Disease

## 2016-01-18 DIAGNOSIS — I213 ST elevation (STEMI) myocardial infarction of unspecified site: Secondary | ICD-10-CM | POA: Diagnosis not present

## 2016-01-18 DIAGNOSIS — Z955 Presence of coronary angioplasty implant and graft: Secondary | ICD-10-CM | POA: Insufficient documentation

## 2016-01-20 ENCOUNTER — Encounter (HOSPITAL_COMMUNITY)
Admission: RE | Admit: 2016-01-20 | Discharge: 2016-01-20 | Disposition: A | Discharge status: Home / Self Care | Payer: Medicare Other | Source: Ambulatory Visit | Attending: Cardiovascular Disease | Admitting: Cardiovascular Disease

## 2016-01-20 DIAGNOSIS — I213 ST elevation (STEMI) myocardial infarction of unspecified site: Secondary | ICD-10-CM | POA: Diagnosis not present

## 2016-01-20 DIAGNOSIS — Z955 Presence of coronary angioplasty implant and graft: Secondary | ICD-10-CM | POA: Diagnosis not present

## 2016-01-23 ENCOUNTER — Encounter (HOSPITAL_COMMUNITY)
Admission: RE | Admit: 2016-01-23 | Discharge: 2016-01-23 | Disposition: A | Discharge status: Home / Self Care | Payer: Medicare Other | Source: Ambulatory Visit | Attending: Cardiovascular Disease | Admitting: Cardiovascular Disease

## 2016-01-23 DIAGNOSIS — Z955 Presence of coronary angioplasty implant and graft: Secondary | ICD-10-CM | POA: Diagnosis not present

## 2016-01-23 DIAGNOSIS — I213 ST elevation (STEMI) myocardial infarction of unspecified site: Secondary | ICD-10-CM | POA: Diagnosis not present

## 2016-01-25 ENCOUNTER — Encounter (HOSPITAL_COMMUNITY)
Admission: RE | Admit: 2016-01-25 | Discharge: 2016-01-25 | Disposition: A | Discharge status: Home / Self Care | Payer: Medicare Other | Source: Ambulatory Visit | Attending: Cardiovascular Disease | Admitting: Cardiovascular Disease

## 2016-01-25 DIAGNOSIS — Z955 Presence of coronary angioplasty implant and graft: Secondary | ICD-10-CM | POA: Diagnosis not present

## 2016-01-25 DIAGNOSIS — I213 ST elevation (STEMI) myocardial infarction of unspecified site: Secondary | ICD-10-CM | POA: Diagnosis not present

## 2016-01-27 ENCOUNTER — Encounter (HOSPITAL_COMMUNITY)
Admission: RE | Admit: 2016-01-27 | Discharge: 2016-01-27 | Disposition: A | Discharge status: Home / Self Care | Payer: Medicare Other | Source: Ambulatory Visit | Attending: Cardiovascular Disease | Admitting: Cardiovascular Disease

## 2016-01-27 DIAGNOSIS — Z955 Presence of coronary angioplasty implant and graft: Secondary | ICD-10-CM | POA: Diagnosis not present

## 2016-01-27 DIAGNOSIS — I213 ST elevation (STEMI) myocardial infarction of unspecified site: Secondary | ICD-10-CM | POA: Diagnosis not present

## 2016-01-30 ENCOUNTER — Encounter (HOSPITAL_COMMUNITY)
Admission: RE | Admit: 2016-01-30 | Discharge: 2016-01-30 | Disposition: A | Discharge status: Home / Self Care | Payer: Medicare Other | Source: Ambulatory Visit | Attending: Cardiovascular Disease | Admitting: Cardiovascular Disease

## 2016-01-30 DIAGNOSIS — I213 ST elevation (STEMI) myocardial infarction of unspecified site: Secondary | ICD-10-CM | POA: Diagnosis not present

## 2016-01-30 DIAGNOSIS — Z955 Presence of coronary angioplasty implant and graft: Secondary | ICD-10-CM | POA: Diagnosis not present

## 2016-02-01 ENCOUNTER — Encounter (HOSPITAL_COMMUNITY)
Admission: RE | Admit: 2016-02-01 | Discharge: 2016-02-01 | Disposition: A | Discharge status: Home / Self Care | Payer: Medicare Other | Source: Ambulatory Visit | Attending: Cardiovascular Disease | Admitting: Cardiovascular Disease

## 2016-02-01 DIAGNOSIS — I213 ST elevation (STEMI) myocardial infarction of unspecified site: Secondary | ICD-10-CM | POA: Diagnosis not present

## 2016-02-01 DIAGNOSIS — Z955 Presence of coronary angioplasty implant and graft: Secondary | ICD-10-CM | POA: Diagnosis not present

## 2016-02-01 NOTE — Progress Notes (Signed)
Pt graduated from cardiac rehab program today with completion of 36 exercise sessions in Phase II. Pt maintained good attendance and progressed nicely during his participation in rehab as evidenced by increased MET level.   Medication list reconciled. Repeat  PHQ score- 0 .  Pt has made significant lifestyle changes and should be commended for his success. Pt feels he has achieved his goals during cardiac rehab.   Pt plans to continue exercise by using his home equipment, treadmill and bike.Gregory Scott

## 2016-02-03 ENCOUNTER — Encounter (HOSPITAL_COMMUNITY): Payer: Medicare Other

## 2016-02-06 ENCOUNTER — Encounter (HOSPITAL_COMMUNITY): Payer: Medicare Other

## 2016-02-08 ENCOUNTER — Encounter (HOSPITAL_COMMUNITY): Payer: Medicare Other

## 2016-02-10 ENCOUNTER — Encounter (HOSPITAL_COMMUNITY): Payer: Medicare Other

## 2016-02-24 DIAGNOSIS — J040 Acute laryngitis: Secondary | ICD-10-CM | POA: Diagnosis not present

## 2016-02-24 DIAGNOSIS — R05 Cough: Secondary | ICD-10-CM | POA: Diagnosis not present

## 2016-03-08 DIAGNOSIS — M5441 Lumbago with sciatica, right side: Secondary | ICD-10-CM | POA: Diagnosis not present

## 2016-03-08 DIAGNOSIS — M5442 Lumbago with sciatica, left side: Secondary | ICD-10-CM | POA: Diagnosis not present

## 2016-03-08 DIAGNOSIS — M545 Low back pain: Secondary | ICD-10-CM | POA: Diagnosis not present

## 2016-03-13 DIAGNOSIS — M5416 Radiculopathy, lumbar region: Secondary | ICD-10-CM | POA: Diagnosis not present

## 2016-03-13 DIAGNOSIS — M545 Low back pain: Secondary | ICD-10-CM | POA: Diagnosis not present

## 2016-03-14 DIAGNOSIS — M545 Low back pain: Secondary | ICD-10-CM | POA: Diagnosis not present

## 2016-03-14 DIAGNOSIS — M5416 Radiculopathy, lumbar region: Secondary | ICD-10-CM | POA: Diagnosis not present

## 2016-03-20 DIAGNOSIS — M5416 Radiculopathy, lumbar region: Secondary | ICD-10-CM | POA: Diagnosis not present

## 2016-03-20 DIAGNOSIS — M545 Low back pain: Secondary | ICD-10-CM | POA: Diagnosis not present

## 2016-03-22 DIAGNOSIS — C61 Malignant neoplasm of prostate: Secondary | ICD-10-CM | POA: Diagnosis not present

## 2016-03-23 DIAGNOSIS — M5416 Radiculopathy, lumbar region: Secondary | ICD-10-CM | POA: Diagnosis not present

## 2016-03-23 DIAGNOSIS — M545 Low back pain: Secondary | ICD-10-CM | POA: Diagnosis not present

## 2016-03-26 DIAGNOSIS — M1990 Unspecified osteoarthritis, unspecified site: Secondary | ICD-10-CM | POA: Diagnosis not present

## 2016-03-26 DIAGNOSIS — I10 Essential (primary) hypertension: Secondary | ICD-10-CM | POA: Diagnosis not present

## 2016-03-26 DIAGNOSIS — J449 Chronic obstructive pulmonary disease, unspecified: Secondary | ICD-10-CM | POA: Diagnosis not present

## 2016-03-26 DIAGNOSIS — E039 Hypothyroidism, unspecified: Secondary | ICD-10-CM | POA: Diagnosis not present

## 2016-03-26 DIAGNOSIS — I252 Old myocardial infarction: Secondary | ICD-10-CM | POA: Diagnosis not present

## 2016-03-26 DIAGNOSIS — I2510 Atherosclerotic heart disease of native coronary artery without angina pectoris: Secondary | ICD-10-CM | POA: Diagnosis not present

## 2016-03-26 DIAGNOSIS — M545 Low back pain: Secondary | ICD-10-CM | POA: Diagnosis not present

## 2016-03-26 DIAGNOSIS — C61 Malignant neoplasm of prostate: Secondary | ICD-10-CM | POA: Diagnosis not present

## 2016-03-26 DIAGNOSIS — F4310 Post-traumatic stress disorder, unspecified: Secondary | ICD-10-CM | POA: Diagnosis not present

## 2016-03-26 DIAGNOSIS — C3490 Malignant neoplasm of unspecified part of unspecified bronchus or lung: Secondary | ICD-10-CM | POA: Diagnosis not present

## 2016-03-26 DIAGNOSIS — E785 Hyperlipidemia, unspecified: Secondary | ICD-10-CM | POA: Diagnosis not present

## 2016-03-26 DIAGNOSIS — R0609 Other forms of dyspnea: Secondary | ICD-10-CM | POA: Diagnosis not present

## 2016-03-26 DIAGNOSIS — M5416 Radiculopathy, lumbar region: Secondary | ICD-10-CM | POA: Diagnosis not present

## 2016-03-29 DIAGNOSIS — M545 Low back pain: Secondary | ICD-10-CM | POA: Diagnosis not present

## 2016-03-29 DIAGNOSIS — M5416 Radiculopathy, lumbar region: Secondary | ICD-10-CM | POA: Diagnosis not present

## 2016-04-04 DIAGNOSIS — M5416 Radiculopathy, lumbar region: Secondary | ICD-10-CM | POA: Diagnosis not present

## 2016-04-04 DIAGNOSIS — M545 Low back pain: Secondary | ICD-10-CM | POA: Diagnosis not present

## 2016-04-12 DIAGNOSIS — M545 Low back pain: Secondary | ICD-10-CM | POA: Diagnosis not present

## 2016-04-12 DIAGNOSIS — M5416 Radiculopathy, lumbar region: Secondary | ICD-10-CM | POA: Diagnosis not present

## 2016-04-13 DIAGNOSIS — M5416 Radiculopathy, lumbar region: Secondary | ICD-10-CM | POA: Diagnosis not present

## 2016-04-13 DIAGNOSIS — M545 Low back pain: Secondary | ICD-10-CM | POA: Diagnosis not present

## 2016-05-07 DIAGNOSIS — M545 Low back pain: Secondary | ICD-10-CM | POA: Diagnosis not present

## 2016-05-07 DIAGNOSIS — M5416 Radiculopathy, lumbar region: Secondary | ICD-10-CM | POA: Diagnosis not present

## 2016-05-14 DIAGNOSIS — M5416 Radiculopathy, lumbar region: Secondary | ICD-10-CM | POA: Diagnosis not present

## 2016-05-14 DIAGNOSIS — M545 Low back pain: Secondary | ICD-10-CM | POA: Diagnosis not present

## 2016-05-16 DIAGNOSIS — M5416 Radiculopathy, lumbar region: Secondary | ICD-10-CM | POA: Diagnosis not present

## 2016-05-16 DIAGNOSIS — M545 Low back pain: Secondary | ICD-10-CM | POA: Diagnosis not present

## 2016-05-21 DIAGNOSIS — M545 Low back pain: Secondary | ICD-10-CM | POA: Diagnosis not present

## 2016-05-21 DIAGNOSIS — M5416 Radiculopathy, lumbar region: Secondary | ICD-10-CM | POA: Diagnosis not present

## 2016-05-24 DIAGNOSIS — M5416 Radiculopathy, lumbar region: Secondary | ICD-10-CM | POA: Diagnosis not present

## 2016-05-24 DIAGNOSIS — M545 Low back pain: Secondary | ICD-10-CM | POA: Diagnosis not present

## 2016-06-25 DIAGNOSIS — I2510 Atherosclerotic heart disease of native coronary artery without angina pectoris: Secondary | ICD-10-CM | POA: Diagnosis not present

## 2016-06-25 DIAGNOSIS — J449 Chronic obstructive pulmonary disease, unspecified: Secondary | ICD-10-CM | POA: Diagnosis not present

## 2016-06-25 DIAGNOSIS — E785 Hyperlipidemia, unspecified: Secondary | ICD-10-CM | POA: Diagnosis not present

## 2016-06-25 DIAGNOSIS — E039 Hypothyroidism, unspecified: Secondary | ICD-10-CM | POA: Diagnosis not present

## 2016-06-25 DIAGNOSIS — I10 Essential (primary) hypertension: Secondary | ICD-10-CM | POA: Diagnosis not present

## 2016-06-25 DIAGNOSIS — I252 Old myocardial infarction: Secondary | ICD-10-CM | POA: Diagnosis not present

## 2016-06-25 DIAGNOSIS — R0609 Other forms of dyspnea: Secondary | ICD-10-CM | POA: Diagnosis not present

## 2016-06-25 DIAGNOSIS — C3490 Malignant neoplasm of unspecified part of unspecified bronchus or lung: Secondary | ICD-10-CM | POA: Diagnosis not present

## 2016-06-25 DIAGNOSIS — F4310 Post-traumatic stress disorder, unspecified: Secondary | ICD-10-CM | POA: Diagnosis not present

## 2016-06-26 DIAGNOSIS — C61 Malignant neoplasm of prostate: Secondary | ICD-10-CM | POA: Diagnosis not present

## 2016-06-29 DIAGNOSIS — M85522 Aneurysmal bone cyst, left upper arm: Secondary | ICD-10-CM | POA: Diagnosis not present

## 2016-08-07 DIAGNOSIS — M5442 Lumbago with sciatica, left side: Secondary | ICD-10-CM | POA: Diagnosis not present

## 2016-08-07 DIAGNOSIS — M4317 Spondylolisthesis, lumbosacral region: Secondary | ICD-10-CM | POA: Diagnosis not present

## 2016-08-07 DIAGNOSIS — M545 Low back pain: Secondary | ICD-10-CM | POA: Diagnosis not present

## 2016-08-07 DIAGNOSIS — M5441 Lumbago with sciatica, right side: Secondary | ICD-10-CM | POA: Diagnosis not present

## 2016-08-13 DIAGNOSIS — M5416 Radiculopathy, lumbar region: Secondary | ICD-10-CM | POA: Diagnosis not present

## 2016-08-15 DIAGNOSIS — E785 Hyperlipidemia, unspecified: Secondary | ICD-10-CM | POA: Diagnosis not present

## 2016-08-15 DIAGNOSIS — I10 Essential (primary) hypertension: Secondary | ICD-10-CM | POA: Diagnosis not present

## 2016-08-15 DIAGNOSIS — F4310 Post-traumatic stress disorder, unspecified: Secondary | ICD-10-CM | POA: Diagnosis not present

## 2016-08-15 DIAGNOSIS — E039 Hypothyroidism, unspecified: Secondary | ICD-10-CM | POA: Diagnosis not present

## 2016-08-15 DIAGNOSIS — I2510 Atherosclerotic heart disease of native coronary artery without angina pectoris: Secondary | ICD-10-CM | POA: Diagnosis not present

## 2016-08-15 DIAGNOSIS — M1990 Unspecified osteoarthritis, unspecified site: Secondary | ICD-10-CM | POA: Diagnosis not present

## 2016-08-15 DIAGNOSIS — C3490 Malignant neoplasm of unspecified part of unspecified bronchus or lung: Secondary | ICD-10-CM | POA: Diagnosis not present

## 2016-08-15 DIAGNOSIS — M5416 Radiculopathy, lumbar region: Secondary | ICD-10-CM | POA: Diagnosis not present

## 2016-08-15 DIAGNOSIS — I252 Old myocardial infarction: Secondary | ICD-10-CM | POA: Diagnosis not present

## 2016-08-15 DIAGNOSIS — J449 Chronic obstructive pulmonary disease, unspecified: Secondary | ICD-10-CM | POA: Diagnosis not present

## 2016-09-04 DIAGNOSIS — M5416 Radiculopathy, lumbar region: Secondary | ICD-10-CM | POA: Diagnosis not present

## 2016-09-18 DIAGNOSIS — M5416 Radiculopathy, lumbar region: Secondary | ICD-10-CM | POA: Diagnosis not present

## 2016-09-25 DIAGNOSIS — C61 Malignant neoplasm of prostate: Secondary | ICD-10-CM | POA: Diagnosis not present

## 2016-10-08 ENCOUNTER — Other Ambulatory Visit: Payer: Self-pay | Admitting: Cardiology

## 2016-10-08 DIAGNOSIS — E039 Hypothyroidism, unspecified: Secondary | ICD-10-CM | POA: Diagnosis not present

## 2016-10-08 DIAGNOSIS — I10 Essential (primary) hypertension: Secondary | ICD-10-CM | POA: Diagnosis not present

## 2016-10-08 DIAGNOSIS — I252 Old myocardial infarction: Secondary | ICD-10-CM | POA: Diagnosis not present

## 2016-10-08 DIAGNOSIS — E785 Hyperlipidemia, unspecified: Secondary | ICD-10-CM | POA: Diagnosis not present

## 2016-10-08 DIAGNOSIS — Z01810 Encounter for preprocedural cardiovascular examination: Secondary | ICD-10-CM | POA: Diagnosis not present

## 2016-10-08 DIAGNOSIS — M1990 Unspecified osteoarthritis, unspecified site: Secondary | ICD-10-CM | POA: Diagnosis not present

## 2016-10-08 DIAGNOSIS — R079 Chest pain, unspecified: Secondary | ICD-10-CM

## 2016-10-08 DIAGNOSIS — F4310 Post-traumatic stress disorder, unspecified: Secondary | ICD-10-CM | POA: Diagnosis not present

## 2016-10-08 DIAGNOSIS — I2510 Atherosclerotic heart disease of native coronary artery without angina pectoris: Secondary | ICD-10-CM | POA: Diagnosis not present

## 2016-10-15 ENCOUNTER — Encounter (HOSPITAL_COMMUNITY)
Admission: RE | Admit: 2016-10-15 | Discharge: 2016-10-15 | Disposition: A | Discharge status: Home / Self Care | Payer: Medicare Other | Source: Ambulatory Visit | Attending: Cardiology | Admitting: Cardiology

## 2016-10-15 DIAGNOSIS — F4310 Post-traumatic stress disorder, unspecified: Secondary | ICD-10-CM | POA: Diagnosis not present

## 2016-10-15 DIAGNOSIS — R079 Chest pain, unspecified: Secondary | ICD-10-CM | POA: Insufficient documentation

## 2016-10-15 DIAGNOSIS — I10 Essential (primary) hypertension: Secondary | ICD-10-CM | POA: Diagnosis not present

## 2016-10-15 DIAGNOSIS — E039 Hypothyroidism, unspecified: Secondary | ICD-10-CM | POA: Diagnosis not present

## 2016-10-15 DIAGNOSIS — Z01810 Encounter for preprocedural cardiovascular examination: Secondary | ICD-10-CM | POA: Diagnosis not present

## 2016-10-15 DIAGNOSIS — I2510 Atherosclerotic heart disease of native coronary artery without angina pectoris: Secondary | ICD-10-CM | POA: Diagnosis not present

## 2016-10-15 DIAGNOSIS — I252 Old myocardial infarction: Secondary | ICD-10-CM | POA: Diagnosis not present

## 2016-10-15 LAB — LIPID PANEL
Cholesterol: 136 mg/dL (ref 0–200)
HDL: 48 mg/dL (ref >40)
LDL Cholesterol: 69 mg/dL (ref 0–99)
Total CHOL/HDL Ratio: 2.8 RATIO
Triglycerides: 95 mg/dL (ref <150)
VLDL: 19 mg/dL (ref 0–40)

## 2016-10-15 LAB — BASIC METABOLIC PANEL
ANION GAP: 7 (ref 5–15)
BUN: 10 mg/dL (ref 6–20)
CALCIUM: 9.5 mg/dL (ref 8.9–10.3)
CO2: 24 mmol/L (ref 22–32)
CREATININE: 1.09 mg/dL (ref 0.61–1.24)
Chloride: 106 mmol/L (ref 101–111)
GFR calc Af Amer: >60 mL/min (ref >60)
GLUCOSE: 96 mg/dL (ref 65–99)
Potassium: 4.1 mmol/L (ref 3.5–5.1)
Sodium: 137 mmol/L (ref 135–145)

## 2016-10-15 LAB — HEPATIC FUNCTION PANEL
ALBUMIN: 4.1 g/dL (ref 3.5–5.0)
ALK PHOS: 45 U/L (ref 38–126)
ALT: 14 U/L — AB (ref 17–63)
AST: 20 U/L (ref 15–41)
Bilirubin, Direct: <0.1 mg/dL — ABNORMAL LOW (ref 0.1–0.5)
TOTAL PROTEIN: 7.1 g/dL (ref 6.5–8.1)
Total Bilirubin: 0.6 mg/dL (ref 0.3–1.2)

## 2016-10-15 MED ORDER — TECHNETIUM TC 99M TETROFOSMIN IV KIT
30.0000 | PACK | Freq: Once | INTRAVENOUS | Status: AC | PRN
  Administered 2016-10-15: 30 via INTRAVENOUS

## 2016-10-15 MED ORDER — REGADENOSON 0.4 MG/5ML IV SOLN
0.4000 mg | Freq: Once | INTRAVENOUS | Status: AC
  Administered 2016-10-15: 0.4 mg via INTRAVENOUS

## 2016-10-15 MED ORDER — TECHNETIUM TC 99M TETROFOSMIN IV KIT
10.0000 | PACK | Freq: Once | INTRAVENOUS | Status: AC | PRN
  Administered 2016-10-15: 10 via INTRAVENOUS

## 2016-10-30 ENCOUNTER — Encounter: Payer: Self-pay | Admitting: Vascular Surgery

## 2016-10-31 ENCOUNTER — Other Ambulatory Visit: Payer: Self-pay | Admitting: Orthopedic Surgery

## 2016-11-01 ENCOUNTER — Encounter: Payer: Self-pay | Admitting: Vascular Surgery

## 2016-11-06 ENCOUNTER — Encounter: Payer: Self-pay | Admitting: Vascular Surgery

## 2016-11-06 ENCOUNTER — Ambulatory Visit (INDEPENDENT_AMBULATORY_CARE_PROVIDER_SITE_OTHER): Payer: Medicare Other | Admitting: Vascular Surgery

## 2016-11-06 VITALS — BP 123/72 | HR 73 | Temp 98.5°F | Resp 18 | Ht 69.0 in | Wt 168.7 lb

## 2016-11-06 DIAGNOSIS — M5137 Other intervertebral disc degeneration, lumbosacral region: Secondary | ICD-10-CM | POA: Diagnosis not present

## 2016-11-06 NOTE — Progress Notes (Signed)
Vascular and Vein Specialist of [facility]  Patient name: Gregory Scott MRN: [id number] DOB: 02/13/1941 Sex: male  REASON FOR CONSULT: Discussed anterior exposure for L5-S1 disc surger  HPI: Gregory Scott is a 75 y.o. male, who is seen today for discussion of my role for L5-S1 disc surgery. He is history of severe bilateral lower from any discomfort. This is been present for a number years but is been quite progressive over the last year making it difficult for him to do his usual activities. He has failed conservative treatment with bracing and also with injections. He reports that this can occur with walking but is worse with the lying and sitting. He does have a remote history of myocardial infarction. No history of peripheral vascular occlusive disease. He has no history of prior abdominal surgery.  Past Medical History:  Diagnosis Date  . Acute MI anterior wall first episode care ([place]) 09/21/2015  . Emphysema lung ([place])   . Prostate cancer [facility])     Family History  Problem Relation Age of Onset  . Coronary artery disease Mother 48    SOCIAL HISTORY: Social History   Social History  . Marital status: Married    Spouse name: N/A  . Number of children: N/A  . Years of education: N/A   Occupational History  . Not on file.   Social History Main Topics  . Smoking status: Never Smoker  . Smokeless tobacco: Never Used  . Alcohol use No  . Drug use: No  . Sexual activity: Not on file   Other Topics Concern  . Not on file   Social History Narrative   Worked in a [employer]   Married   Nonsmoker    Allergies  Allergen Reactions  . Statins     Legs hurt in high doses    Current Outpatient Prescriptions  Medication Sig Dispense Refill  . acetaminophen-codeine (TYLENOL #3) 300-30 MG tablet Take 1 tablet by mouth 3 (three) times daily as needed for moderate pain. Reported on 11/07/2015    . aspirin EC 81 MG EC tablet Take 1  tablet (81 mg total) by mouth daily.    . clopidogrel (PLAVIX) 75 MG tablet Take 75 mg by mouth daily.    Marland Kitchen gabapentin (NEURONTIN) 300 MG capsule Take 1,200 mg by mouth at bedtime.     Marland Kitchen HYDROcodone-acetaminophen (NORCO/VICODIN) 5-325 MG tablet Take 1 tablet by mouth 2 (two) times daily as needed for moderate pain.    Marland Kitchen levothyroxine (SYNTHROID, LEVOTHROID) 50 MCG tablet Take 50 mcg by mouth daily before breakfast.    . nitroGLYCERIN (NITROSTAT) 0.4 MG SL tablet Place 1 tablet (0.4 mg total) under the tongue every 5 (five) minutes x 3 doses as needed for chest pain. 25 tablet 0  . omeprazole (PRILOSEC) 20 MG capsule Take 20 mg by mouth daily as needed (indigestion).    Marland Kitchen PROVENTIL HFA 108 (90 BASE) MCG/ACT inhaler Take 1-2 puffs by mouth every 6 (six) hours as needed for wheezing or shortness of breath. Reported on 02/01/2016    . rosuvastatin (CRESTOR) 20 MG tablet Take 10 mg by mouth at bedtime.     No current facility-administered medications for this visit.     REVIEW OF SYSTEMS:  '[X]'$  denotes positive finding, '[ ]'$  denotes negative finding Cardiac  Comments:  Chest pain or chest pressure:    Shortness of breath upon exertion:    Short of breath when lying flat:    Irregular heart rhythm:  Vascular    Pain in calf, thigh, or hip brought on by ambulation:    Pain in feet at night that wakes you up from your sleep:     Blood clot in your veins:    Leg swelling:         Pulmonary    Oxygen at home:    Productive cough:     Wheezing:         Neurologic    Sudden weakness in arms or legs:     Sudden numbness in arms or legs:     Sudden onset of difficulty speaking or slurred speech:    Temporary loss of vision in one eye:     Problems with dizziness:         Gastrointestinal    Blood in stool:     Vomited blood:         Genitourinary    Burning when urinating:     Blood in urine:        Psychiatric    Major depression:         Hematologic    Bleeding problems:      Problems with blood clotting too easily:        Skin    Rashes or ulcers:        Constitutional    Fever or chills:      PHYSICAL EXAM: Vitals:   11/06/16 1431  BP: 123/72  Pulse: 73  Resp: 18  Temp: 98.5 F (36.9 C)  TempSrc: Oral  SpO2: 98%  Weight: 168 lb 11.2 oz (76.5 kg)  Height: '5\' 9"'$  (1.753 m)    GENERAL: The patient is a well-nourished male, in no acute distress. The vital signs are documented above. CARDIOVASCULAR: 2+ radial and 2+ dorsalis pedis pulses bilaterally PULMONARY: There is good air exchange  ABDOMEN: Soft and non-tender . No masses noted. No prior abdominal surgical incisions MUSCULOSKELETAL: There are no major deformities or cyanosis. NEUROLOGIC: No focal weakness or paresthesias are detected. SKIN: There are no ulcers or rashes noted. PSYCHIATRIC: The patient has a normal affect.  DATA:  Did review his plain lumbar films shows no evidence of calcification and his aortoiliac segments  MEDICAL ISSUES: Had long discussion with the patient regarding my role in exposure. Dr. Lynann Bologna has recommended surgical repair and has recommended anterior approach. Explained mobilization of the intraperitoneal contents, left ureter and arterial venous structures overlying the spine. Also explained potential for injury all of these. Patient understands and wished to proceed as scheduled on January 3   Rosetta Posner, MD [facility] Vascular and Vein Specialists of [facility] Tel [phone] Pager [phone]

## 2016-11-09 NOTE — Pre-Procedure Instructions (Signed)
Jaquez Farrington  11/09/2016      [id number] [address], [address] [place] [address] Phone: [phone] Fax: [phone]  [place] ([address]), [place] - [address] [place] [address] Phone: [phone] Fax: [phone]    Your procedure is scheduled on  Wednesday, January 3.   Report to [facility] Admitting at 6:30 AM               Your surgery or procedure is scheduled for 8:30 AM   Call this number if you have problems the morning of surgery: [phone]  For any other questions, please call [phone], Tuesday - Friday 8 AM - 4 PM.   Remember:  Do not eat food or drink liquids after midnight Tuesday, January 2  Take these medicines the morning of surgery with A SIP OF WATER : levothyroxine (SYNTHROID, LEVOTHROID). Mya use Albuterol Inhalet if needed and bring it in with you.                Take if needed: acetaminophen-codeine (TYLENOL #3)- or HYDROcodone-acetaminophen (NORCO/VICODIN) if you tolerate it on an empty stomach, nitroGLYCERIN (NITROSTAT), omeprazole (PRILOSEC).                Hold aspirin and clopidogrel (PLAVIX) and instructed by your surgeon.              1 Week prior to surgery STOP taking, Aspirin Products (Goody Powder, Excedrin Migraine), Ibuprofen (Advil), Naproxen (Aleve), Vitamins and Herbal Products (ie Fish Oil)        [place]- Preparing For Surgery  Before surgery, you can play an important role. Because skin is not sterile, your skin needs to be as free of germs as possible. You can reduce the number of germs on your skin by washing with CHG (chlorahexidine gluconate) Soap before surgery.  CHG is an antiseptic cleaner which kills germs and bonds with the skin to continue killing germs even after washing.  Please do not use if you have an allergy to CHG or antibacterial soaps. If your skin becomes reddened/irritated stop  using the CHG.  Do not shave (including legs and underarms) for at least 48 hours prior to first CHG shower. It is OK to shave your face.  Please follow these instructions carefully.   1. Shower the NIGHT BEFORE SURGERY and the MORNING OF SURGERY with CHG.   2. If you chose to wash your hair, wash your hair first as usual with your normal shampoo.  3. After you shampoo, rinse your hair and body thoroughly to remove the shampoo.  4. Use CHG as you would any other liquid soap. You can apply CHG directly to the skin and wash gently with a scrungie or a clean washcloth.   5. Apply the CHG Soap to your body ONLY FROM THE NECK DOWN.  Do not use on open wounds or open sores. Avoid contact with your eyes, ears, mouth and genitals (private parts). Wash genitals (private parts) with your normal soap.  6. Wash thoroughly, paying special attention to the area where your surgery will be performed.  7. Thoroughly rinse your body with warm water from the neck down.  8. DO NOT shower/wash with your normal soap after using and rinsing off the CHG Soap.  9. Pat yourself dry with a CLEAN TOWEL.   10. Wear CLEAN PAJAMAS   11. Place CLEAN  SHEETS on your bed the night of your first shower and DO NOT SLEEP WITH PETS.  Day of Surgery: Do not apply any deodorants/lotions. Please wear clean clothes to the hospital/surgery center.    Do not wear jewelry, make-up or nail polish.  Do not wear lotions, powders, or perfumes, or deodorant.  Men may shave face and neck.  Do not bring valuables to the hospital.  [facility] is not responsible for any belongings or valuables.  Contacts, dentures or bridgework may not be worn into surgery.  Leave your suitcase in the car.  After surgery it may be brought to your room.  For patients admitted to the hospital, discharge time will be determined by your treatment team.  Patients discharged the day of surgery will not be allowed to drive home.   Special instructions:   -  Please read over the following fact sheets that you were given:  Patient Instructions for Mupirocin Application, Incentive Spirometry, Pain Booklet

## 2016-11-13 ENCOUNTER — Other Ambulatory Visit: Payer: Self-pay

## 2016-11-13 ENCOUNTER — Encounter (HOSPITAL_COMMUNITY): Payer: Self-pay

## 2016-11-13 ENCOUNTER — Encounter (HOSPITAL_COMMUNITY)
Admission: RE | Admit: 2016-11-13 | Discharge: 2016-11-13 | Disposition: A | Discharge status: Home / Self Care | Payer: Medicare Other | Source: Ambulatory Visit | Attending: Orthopedic Surgery | Admitting: Orthopedic Surgery

## 2016-11-13 ENCOUNTER — Encounter (HOSPITAL_COMMUNITY): Payer: Self-pay | Admitting: Vascular Surgery

## 2016-11-13 ENCOUNTER — Ambulatory Visit (HOSPITAL_COMMUNITY)
Admission: RE | Admit: 2016-11-13 | Discharge: 2016-11-13 | Disposition: A | Discharge status: Home / Self Care | Payer: Medicare Other | Source: Ambulatory Visit | Attending: Orthopedic Surgery | Admitting: Orthopedic Surgery

## 2016-11-13 DIAGNOSIS — I10 Essential (primary) hypertension: Secondary | ICD-10-CM | POA: Insufficient documentation

## 2016-11-13 DIAGNOSIS — Z01818 Encounter for other preprocedural examination: Secondary | ICD-10-CM | POA: Diagnosis not present

## 2016-11-13 DIAGNOSIS — Z01812 Encounter for preprocedural laboratory examination: Secondary | ICD-10-CM | POA: Insufficient documentation

## 2016-11-13 DIAGNOSIS — R9431 Abnormal electrocardiogram [ECG] [EKG]: Secondary | ICD-10-CM | POA: Insufficient documentation

## 2016-11-13 DIAGNOSIS — Z7982 Long term (current) use of aspirin: Secondary | ICD-10-CM | POA: Insufficient documentation

## 2016-11-13 DIAGNOSIS — Z79899 Other long term (current) drug therapy: Secondary | ICD-10-CM | POA: Diagnosis not present

## 2016-11-13 HISTORY — DX: Hypothyroidism, unspecified: E03.9

## 2016-11-13 HISTORY — DX: Malignant neoplasm of unspecified part of unspecified bronchus or lung: C34.90

## 2016-11-13 HISTORY — DX: Sleep apnea, unspecified: G47.30

## 2016-11-13 HISTORY — DX: Unspecified osteoarthritis, unspecified site: M19.90

## 2016-11-13 HISTORY — DX: Post-traumatic stress disorder, unspecified: F43.10

## 2016-11-13 HISTORY — DX: Atherosclerotic heart disease of native coronary artery without angina pectoris: I25.10

## 2016-11-13 HISTORY — DX: Gastro-esophageal reflux disease without esophagitis: K21.9

## 2016-11-13 HISTORY — DX: Dyspnea, unspecified: R06.00

## 2016-11-13 LAB — APTT: APTT: 28 s (ref 24–36)

## 2016-11-13 LAB — CBC WITH DIFFERENTIAL/PLATELET
BASOS ABS: 0 10*3/uL (ref 0.0–0.1)
Basophils Relative: 1 %
Eosinophils Absolute: 0.2 10*3/uL (ref 0.0–0.7)
Eosinophils Relative: 5 %
HCT: 23.7 % — ABNORMAL LOW (ref 39.0–52.0)
Hemoglobin: 6.4 g/dL — CL (ref 13.0–17.0)
LYMPHS ABS: 0.8 10*3/uL (ref 0.7–4.0)
Lymphocytes Relative: 17 %
MCH: 18.6 pg — ABNORMAL LOW (ref 26.0–34.0)
MCHC: 27 g/dL — ABNORMAL LOW (ref 30.0–36.0)
MCV: 68.7 fL — ABNORMAL LOW (ref 78.0–100.0)
MONO ABS: 0.3 10*3/uL (ref 0.1–1.0)
Monocytes Relative: 7 %
Neutro Abs: 3.5 10*3/uL (ref 1.7–7.7)
Neutrophils Relative %: 70 %
PLATELETS: 284 10*3/uL (ref 150–400)
RBC: 3.45 MIL/uL — AB (ref 4.22–5.81)
RDW: 17.5 % — AB (ref 11.5–15.5)
WBC: 4.8 10*3/uL (ref 4.0–10.5)

## 2016-11-13 LAB — COMPREHENSIVE METABOLIC PANEL
ALBUMIN: 3.9 g/dL (ref 3.5–5.0)
ALT: 12 U/L — ABNORMAL LOW (ref 17–63)
AST: 19 U/L (ref 15–41)
Alkaline Phosphatase: 48 U/L (ref 38–126)
Anion gap: 5 (ref 5–15)
BUN: 8 mg/dL (ref 6–20)
CALCIUM: 9.4 mg/dL (ref 8.9–10.3)
CHLORIDE: 107 mmol/L (ref 101–111)
CO2: 26 mmol/L (ref 22–32)
Creatinine, Ser: 0.98 mg/dL (ref 0.61–1.24)
GFR calc Af Amer: >60 mL/min (ref >60)
GFR calc non Af Amer: >60 mL/min (ref >60)
GLUCOSE: 117 mg/dL — AB (ref 65–99)
POTASSIUM: 4 mmol/L (ref 3.5–5.1)
Sodium: 138 mmol/L (ref 135–145)
TOTAL PROTEIN: 6.9 g/dL (ref 6.5–8.1)
Total Bilirubin: 0.3 mg/dL (ref 0.3–1.2)

## 2016-11-13 LAB — PROTIME-INR
INR: 1.09
Prothrombin Time: 14.2 seconds (ref 11.4–15.2)

## 2016-11-13 LAB — SURGICAL PCR SCREEN
MRSA, PCR: NEGATIVE
Staphylococcus aureus: NEGATIVE

## 2016-11-13 LAB — URINALYSIS, ROUTINE W REFLEX MICROSCOPIC
Bilirubin Urine: NEGATIVE
GLUCOSE, UA: NEGATIVE mg/dL
Hgb urine dipstick: NEGATIVE
Ketones, ur: NEGATIVE mg/dL
LEUKOCYTES UA: NEGATIVE
Nitrite: NEGATIVE
PROTEIN: NEGATIVE mg/dL
SPECIFIC GRAVITY, URINE: 1.018 (ref 1.005–1.030)
pH: 6 (ref 5.0–8.0)

## 2016-11-13 LAB — ABO/RH: ABO/RH(D): O POS

## 2016-11-13 NOTE — Progress Notes (Signed)
PCP - Chancy Milroy Cardiologist - Harwani    Chest x-ray - 11/13/16 EKG - 11/13/16 Stress Test - 10/15/16 ECHO - 2016  Cardiac Cath -2016  Patient was instructed to stop Plavix on 12/22 and Aspirin on 12/24  Patient followed instructions   Will send to anesthesia for review of cardiac history     Patient denies shortness of breath, fever, cough and chest pain at PAT appointment

## 2016-11-13 NOTE — Progress Notes (Signed)
CRITICAL VALUE ALERT  Critical value received:  HBG 6.4  Date of notification:  12-26  Time of notification:  4818  Critical value read back:Yes.    Nurse who received alert:  Fabian Sharp, RN  MD notified (1st page):  [facility] Spoke With Angela Nevin who will send message to MD who is in office and will notify patient  Time of first page:  1040  MD notified (2nd page):  Time of second page:  Responding MD:  Lynann Bologna  Time MD responded:  1100

## 2016-11-14 ENCOUNTER — Encounter (HOSPITAL_COMMUNITY): Payer: Self-pay

## 2016-11-14 NOTE — Progress Notes (Addendum)
Anesthesia Chart Review: Patient is a 75 year old male scheduled for L5-S1 anterior lumbar interbody fusion, L5-S1 posterior spinal fusion on 11/21/2016 by Dr. Lynann Bologna. Anterior exposure by Dr. Donnetta Hutching.  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  PCP is Dr. Bernell List with the [facility] [phone]).  Cardiologist is Dr. Terrence Dupont. Per Angela Nevin at Dr. Laurena Bering office, they received a letter today from Dr. Terrence Dupont indicating that patient is "acceptable risk" for surgery and gave permission to hold Plavix for seven days. (PAT RN documented that patient says he was instructed to hold Plavix on 11/09/16 and ASA on 12/24 which would be 12 and 10 days off which is not typical and does not match instructions sent by Dr. Terrence Dupont. I spoke with Angela Nevin. She is going to follow-up with Dr. Terrence Dupont and patient to clarify instructions about both Plavix and ASA. She will let Dr. Terrence Dupont know that patient has already stopped both.)   Meds include Tylenol No. 3, aspirin 81 mg, Plavix 75 mg, Neurontin, Norco, levothyroxine, nitroglycerin, Prilosec, Proventil HFA, Crestor.  BP 136/63   Pulse 72   Temp 36.7 C   Resp 18   Ht '5\' 9"'$  (1.753 m)   Wt 166 lb 11.2 oz (75.6 kg)   SpO2 100%   BMI 24.62 kg/m   EKG 11/13/16: Normal sinus rhythm, nonspecific ST abnormality.  Nuclear stress test 10/15/16: IMPRESSION: 1. No reversible ischemia or infarction. 2. Normal left ventricular wall motion. 3. Left ventricular ejection fraction 64% 4. Non invasive risk stratification*: low  Echo 09/23/15: Study Conclusions - Left ventricle: The cavity size was normal. Wall thickness was   normal. Systolic function was vigorous. The estimated ejection   fraction was in the range of 65% to 70%. Wall motion was normal;   there were no regional wall motion abnormalities. Doppler   parameters are  consistent with abnormal left ventricular   relaxation (grade 1 diastolic dysfunction).  Cardiac cath 09/21/15: Narrative & Impression     Mid RCA lesion, 25% stenosed.  Prox LAD lesion, 99% stenosed. Post intervention, there is a 0% residual stenosis.  There is mild left ventricular systolic dysfunction.   1. Critical stenosis of the proximal LAD, treated successfully with a 3.5 mm drug-eluting stent 2. Minor nonobstructive stenosis of the RCA 3. Widely patent left circumflex with no significant stenosis 4. Mild segmental LV systolic dysfunction consistent with the patient's acute anterolateral MI, estimated LVEF 45-50%  Recommendations: The patient will be transferred to the CCU. He should continue dual antiplatelet therapy with aspirin and brilinta for at least 12 months. He has predictors for a favorable prognosis with rapid resolution of ST segment changes following reperfusion. Will check an echocardiogram in 48 hours. He may be a candidate for 48 hour discharge if he is doing well clinically. Routine post MI medical therapy should be instituted. Note the patient is unable to take statin drugs. He has had severe myalgias and is unable to tolerate them even at low doses.   CXR 11/13/16:IMPRESSION: No active cardiopulmonary disease.   Preoperative labs noted. UA WNL. Cr 0.98, glucose 117, AST 19, ALT 12. PT/PTT WNL. CBC showed critical low HGB 6.4. HCT 23.7. PLT 284. RBC morphology showed elliptocytes, teardrop cells, polychromasia. Critical HGB result called to [facility] yesterday, and she reviewed with Dr. Lynann Bologna. She also spoke with patient and called labs to Dr. Polly Cobia nurse. Apparently patient already had a scheduled visit with Dr. Felton Clinton on  11/16/16, but Angela Nevin will follow-up to see if they will be seeing patient sooner. Dr. Lynann Bologna will plan to postpone the case if anemia is not addressed prior to surgery.   Chart will be left for follow-up 1) regarding what was done and PCP  recommendations in regards to anemia and timing of surgery, 2) Dr. Zenia Resides preoperative recommendations for Plavix/ASA.  [facility] Short Stay Center/Anesthesiology Phone [phone] 11/14/2016 10:15 AM  Addendum: Angela Nevin called earlier today. She contacted Dr. Terrence Dupont, and he gave permission to hold both Plavix and ASA for 7 days prior to surgery. She notified Dr. Terrence Dupont that patient had already stopped both. Still awaiting anemia recommendations. Dr. Felton Clinton direct office number with the [facility]) is [phone], ext [phone].  [facility] Short Stay Center/Anesthesiology Phone [phone] 11/14/2016 6:00 PM  Addendum: I do not currently have 11/16/16 [facility] office notes, but by notes in Epic, HGB was repeated with result of 6.0. He was advised to go to the ED for further management. He was admitted overnight by the Hospitalist service and transfused with 2 Units PRBC. Hemoccult was negative. Iron deficiency note, and he was given Feraheme 550 mg and continued on oral iron. CT abd/pelvis showed no etiology for bleed. He was continued on PPI.  ASA and Plavix on hold for surgery. Of note, an incidental right lung nodule was noted on his CT scan which patient reported had been present since 2011 and had been told by a pulmonologist that no further treatment was needed at that time.   CT abd/pelvis 11/17/16: IMPRESSION: 1. No explanation for possible GI bleed and abdominal pain. 2. Mild splenomegaly. 3. The right lower lobe pulmonary nodule is obscured by adjacent atelectasis. 4. Nonobstructing left nephrolithiasis. 5. Additional stable chronic findings as described.  EKG 11/17/16: SR, T wave inversion in V1 and aVL.  Following transfusion, H/H 8.4/28.8. Iron low at 30, ferritin low at 10. PT/PTT WNL. BMET WNL.   Discussed with anesthesiologist Dr. Linna Caprice. Recommend elective surgery be postponed until further explanation of acute  anemia determined (ie, hematology and/or GI work-up/testing). I have notified Juliann Pulse at Dr. Laurena Bering office.  [facility] Short Stay Center/Anesthesiology Phone [phone] 11/20/2016 10:44 AM

## 2016-11-16 ENCOUNTER — Encounter (HOSPITAL_COMMUNITY): Payer: Self-pay | Admitting: Neurology

## 2016-11-16 ENCOUNTER — Observation Stay (HOSPITAL_COMMUNITY)
Admission: EM | Admit: 2016-11-16 | Discharge: 2016-11-17 | Disposition: A | Discharge status: Home / Self Care | Payer: Medicare Other | Attending: Internal Medicine | Admitting: Internal Medicine

## 2016-11-16 DIAGNOSIS — Z8546 Personal history of malignant neoplasm of prostate: Secondary | ICD-10-CM | POA: Diagnosis not present

## 2016-11-16 DIAGNOSIS — R161 Splenomegaly, not elsewhere classified: Secondary | ICD-10-CM | POA: Insufficient documentation

## 2016-11-16 DIAGNOSIS — R109 Unspecified abdominal pain: Secondary | ICD-10-CM | POA: Diagnosis present

## 2016-11-16 DIAGNOSIS — D509 Iron deficiency anemia, unspecified: Secondary | ICD-10-CM | POA: Diagnosis not present

## 2016-11-16 DIAGNOSIS — D649 Anemia, unspecified: Secondary | ICD-10-CM | POA: Diagnosis present

## 2016-11-16 DIAGNOSIS — J439 Emphysema, unspecified: Secondary | ICD-10-CM | POA: Insufficient documentation

## 2016-11-16 DIAGNOSIS — I252 Old myocardial infarction: Secondary | ICD-10-CM | POA: Diagnosis not present

## 2016-11-16 DIAGNOSIS — Z7982 Long term (current) use of aspirin: Secondary | ICD-10-CM | POA: Diagnosis not present

## 2016-11-16 DIAGNOSIS — N200 Calculus of kidney: Secondary | ICD-10-CM | POA: Diagnosis not present

## 2016-11-16 DIAGNOSIS — Z888 Allergy status to other drugs, medicaments and biological substances status: Secondary | ICD-10-CM | POA: Diagnosis not present

## 2016-11-16 DIAGNOSIS — R911 Solitary pulmonary nodule: Secondary | ICD-10-CM | POA: Diagnosis not present

## 2016-11-16 DIAGNOSIS — F4310 Post-traumatic stress disorder, unspecified: Secondary | ICD-10-CM | POA: Insufficient documentation

## 2016-11-16 DIAGNOSIS — Z7902 Long term (current) use of antithrombotics/antiplatelets: Secondary | ICD-10-CM | POA: Insufficient documentation

## 2016-11-16 DIAGNOSIS — E785 Hyperlipidemia, unspecified: Secondary | ICD-10-CM | POA: Diagnosis present

## 2016-11-16 DIAGNOSIS — G8929 Other chronic pain: Secondary | ICD-10-CM | POA: Insufficient documentation

## 2016-11-16 DIAGNOSIS — K219 Gastro-esophageal reflux disease without esophagitis: Secondary | ICD-10-CM | POA: Diagnosis not present

## 2016-11-16 DIAGNOSIS — K449 Diaphragmatic hernia without obstruction or gangrene: Secondary | ICD-10-CM | POA: Insufficient documentation

## 2016-11-16 DIAGNOSIS — I10 Essential (primary) hypertension: Secondary | ICD-10-CM | POA: Diagnosis present

## 2016-11-16 DIAGNOSIS — I700 Atherosclerosis of aorta: Secondary | ICD-10-CM | POA: Diagnosis not present

## 2016-11-16 DIAGNOSIS — M549 Dorsalgia, unspecified: Secondary | ICD-10-CM | POA: Diagnosis not present

## 2016-11-16 DIAGNOSIS — I2510 Atherosclerotic heart disease of native coronary artery without angina pectoris: Secondary | ICD-10-CM | POA: Diagnosis present

## 2016-11-16 DIAGNOSIS — G4730 Sleep apnea, unspecified: Secondary | ICD-10-CM | POA: Diagnosis not present

## 2016-11-16 DIAGNOSIS — K862 Cyst of pancreas: Secondary | ICD-10-CM | POA: Insufficient documentation

## 2016-11-16 DIAGNOSIS — E039 Hypothyroidism, unspecified: Secondary | ICD-10-CM | POA: Diagnosis present

## 2016-11-16 LAB — COMPREHENSIVE METABOLIC PANEL
ALT: 12 U/L — AB (ref 17–63)
AST: 17 U/L (ref 15–41)
Albumin: 3.9 g/dL (ref 3.5–5.0)
Alkaline Phosphatase: 48 U/L (ref 38–126)
Anion gap: 7 (ref 5–15)
BUN: 7 mg/dL (ref 6–20)
CHLORIDE: 109 mmol/L (ref 101–111)
CO2: 23 mmol/L (ref 22–32)
Calcium: 9.5 mg/dL (ref 8.9–10.3)
Creatinine, Ser: 0.99 mg/dL (ref 0.61–1.24)
GFR calc non Af Amer: >60 mL/min (ref >60)
Glucose, Bld: 95 mg/dL (ref 65–99)
POTASSIUM: 4 mmol/L (ref 3.5–5.1)
SODIUM: 139 mmol/L (ref 135–145)
Total Bilirubin: 0.5 mg/dL (ref 0.3–1.2)
Total Protein: 6.6 g/dL (ref 6.5–8.1)

## 2016-11-16 LAB — TYPE AND SCREEN
ABO/RH(D): O POS
Antibody Screen: NEGATIVE

## 2016-11-16 LAB — CBC
HEMATOCRIT: 23.3 % — AB (ref 39.0–52.0)
Hemoglobin: 6.4 g/dL — CL (ref 13.0–17.0)
MCH: 19 pg — AB (ref 26.0–34.0)
MCHC: 27.5 g/dL — ABNORMAL LOW (ref 30.0–36.0)
MCV: 69.1 fL — AB (ref 78.0–100.0)
PLATELETS: 302 10*3/uL (ref 150–400)
RBC: 3.37 MIL/uL — AB (ref 4.22–5.81)
RDW: 17.7 % — ABNORMAL HIGH (ref 11.5–15.5)
WBC: 5.2 10*3/uL (ref 4.0–10.5)

## 2016-11-16 LAB — POC OCCULT BLOOD, ED: FECAL OCCULT BLD: NEGATIVE

## 2016-11-16 LAB — PREPARE RBC (CROSSMATCH)

## 2016-11-16 MED ORDER — PANTOPRAZOLE SODIUM 40 MG IV SOLR
40.0000 mg | Freq: Once | INTRAVENOUS | Status: AC
  Administered 2016-11-16: 40 mg via INTRAVENOUS
  Filled 2016-11-16: qty 40

## 2016-11-16 MED ORDER — SODIUM CHLORIDE 0.9 % IV SOLN
INTRAVENOUS | Status: DC
  Administered 2016-11-17: via INTRAVENOUS

## 2016-11-16 MED ORDER — HYDRALAZINE HCL 20 MG/ML IJ SOLN: 5.0000 mg | INTRAMUSCULAR | Status: DC | PRN

## 2016-11-16 MED ORDER — HYDROCODONE-ACETAMINOPHEN 5-325 MG PO TABS
1.0000 | ORAL_TABLET | Freq: Two times a day (BID) | ORAL | Status: DC | PRN
  Administered 2016-11-17: 1 via ORAL
  Filled 2016-11-16: qty 1

## 2016-11-16 MED ORDER — GABAPENTIN 400 MG PO CAPS
1200.0000 mg | ORAL_CAPSULE | Freq: Every day | ORAL | Status: DC
  Administered 2016-11-16: 1200 mg via ORAL
  Filled 2016-11-16: qty 3

## 2016-11-16 MED ORDER — ALBUTEROL SULFATE (2.5 MG/3ML) 0.083% IN NEBU: 3.0000 mL | INHALATION_SOLUTION | Freq: Four times a day (QID) | RESPIRATORY_TRACT | Status: DC | PRN

## 2016-11-16 MED ORDER — ACETAMINOPHEN 650 MG RE SUPP: 650.0000 mg | Freq: Four times a day (QID) | RECTAL | Status: DC | PRN

## 2016-11-16 MED ORDER — LEVOTHYROXINE SODIUM 50 MCG PO TABS
50.0000 ug | ORAL_TABLET | Freq: Every day | ORAL | Status: DC
  Administered 2016-11-17: 50 ug via ORAL
  Filled 2016-11-16: qty 1

## 2016-11-16 MED ORDER — NITROGLYCERIN 0.4 MG SL SUBL: 0.4000 mg | SUBLINGUAL_TABLET | SUBLINGUAL | Status: DC | PRN

## 2016-11-16 MED ORDER — ACETAMINOPHEN 325 MG PO TABS: 650.0000 mg | ORAL_TABLET | Freq: Four times a day (QID) | ORAL | Status: DC | PRN

## 2016-11-16 MED ORDER — ZOLPIDEM TARTRATE 5 MG PO TABS
5.0000 mg | ORAL_TABLET | Freq: Every evening | ORAL | Status: DC | PRN
  Administered 2016-11-17: 5 mg via ORAL
  Filled 2016-11-16: qty 1

## 2016-11-16 MED ORDER — PANTOPRAZOLE SODIUM 40 MG IV SOLR
40.0000 mg | Freq: Two times a day (BID) | INTRAVENOUS | Status: DC
  Administered 2016-11-17: 40 mg via INTRAVENOUS
  Filled 2016-11-16: qty 40

## 2016-11-16 MED ORDER — SODIUM CHLORIDE 0.9% FLUSH
3.0000 mL | Freq: Two times a day (BID) | INTRAVENOUS | Status: DC
  Administered 2016-11-17: 3 mL via INTRAVENOUS

## 2016-11-16 MED ORDER — ONDANSETRON HCL 4 MG/2ML IJ SOLN: 4.0000 mg | Freq: Three times a day (TID) | INTRAMUSCULAR | Status: DC | PRN

## 2016-11-16 MED ORDER — SODIUM CHLORIDE 0.9 % IV SOLN
10.0000 mL/h | Freq: Once | INTRAVENOUS | Status: AC
  Administered 2016-11-16: 10 mL/h via INTRAVENOUS

## 2016-11-16 MED ORDER — ROSUVASTATIN CALCIUM 10 MG PO TABS
10.0000 mg | ORAL_TABLET | Freq: Every day | ORAL | Status: DC
  Administered 2016-11-16: 10 mg via ORAL
  Filled 2016-11-16: qty 1

## 2016-11-16 NOTE — ED Triage Notes (Signed)
Pt here from [place] with HGB 6.0. Was having blood done for pre-op for back surgery next week. Sent here for evaluation. Reports weakness, denies GI bleeding. Was taking plavix until last Thursday.

## 2016-11-16 NOTE — H&P (Signed)
History and Physical    Gregory Scott QHU:[id number] DOB: 05/17/1941 DOA: 11/16/2016  Referring MD/NP/PA:   PCP: Gregory Gong, MD   Patient coming from:  The patient is coming from home.  At baseline, pt is independent for most of ADL.   Chief Complaint: Fatigue and low Hgb   HPI: Gregory Scott is a 75 y.o. male with medical history significant of  Hypertension, hyperlipidemia, GERD, hypothyroidism, prostate cancer (s/p of radiation seeds 2010), CAD, s/p of EDS, PTSD, back pain, who present with fatigue and low Hgb.  Pt state that he has chronic lumbar disc problem and is scheduled for lumbar surgery.  He had blood test done in [place] for pre-op for back surgery next week. He was found to have Hgb 6.0 and therefore was sent to ED. Pt states that he has fatigue recently. He denies CP, SOB, hematochezia, hematemesis, hematuria. He states that he has mild abdominal pain for about 2 months. It is located in the mid abdomen, 2/10 in severity, intermittent. It is not aggravated or alleviated by any factors. No nausea, vomiting, diarrhea. Denies symptoms of UTI. No unilateral weakness. Pt was on ASA and plavix which were held since Thursday for preparation of surgery.   He states that he was found to have "possible lung cancer in right lung" in 2011. He is under watching and was told not need surgery or treatment by pulmonologist. Patient is not seeing any oncologist.   ED Course: pt was found to have hemoglobin dropped from 11.1 on 09/23/15 to 6.4 today, negative FOBT, electrolytes and renal function okay, temperature normal, oxygen saturation 98% on room air. Patient is placed on telemetry bed for observation.  Review of Systems:   General: no fevers, chills, no changes in body weight, has poor appetite, has fatigue HEENT: no blurry vision, hearing changes or sore throat Respiratory: no dyspnea, coughing, wheezing CV: no chest pain, no palpitations GI: no nausea, vomiting, has abdominal  pain, no diarrhea, constipation GU: no dysuria, burning on urination, increased urinary frequency, hematuria  Ext: no leg edema Neuro: no unilateral weakness, numbness, or tingling, no vision change or hearing loss Skin: no rash, no skin tear. MSK: No muscle spasm, no deformity, no limitation of range of movement in spin Heme: No easy bruising.  Travel history: No recent long distant travel.  Allergy:  Allergies  Allergen Reactions  . Statins     Legs hurt in high doses    Past Medical History:  Diagnosis Date  . Acute MI anterior wall first episode care ([place]) 09/21/2015  . Arthritis    back  . Coronary artery disease   . Dyspnea   . Emphysema lung ([place])   . GERD (gastroesophageal reflux disease)   . Hypothyroidism   . Lung cancer ([place])    small part in the right lower   . Prostate cancer ([place])   . PTSD (post-traumatic stress disorder)   . Sleep apnea    sleep study    Past Surgical History:  Procedure Laterality Date  . CARDIAC CATHETERIZATION N/A 09/21/2015   Procedure: Left Heart Cath and Coronary Angiography;  Surgeon: Sherren Mocha, MD;  Location: [place] [facility];  Service: Cardiovascular;  Laterality: N/A;  . CARDIAC CATHETERIZATION Right 09/21/2015   Procedure: Coronary Stent Intervention;  Surgeon: Sherren Mocha, MD;  Location: [place];  Service: Cardiovascular;  Laterality: Right;  . COLONOSCOPY    . knee surgery    . SHOULDER SURGERY    .  TONSILLECTOMY      Social History:  reports that he has never smoked. He has never used smokeless tobacco. He reports that he does not drink alcohol or use drugs.  Family History:  Family History  Problem Relation Age of Onset  . Coronary artery disease Mother 41     Prior to Admission medications   Medication Sig Start Date End Date Taking? Authorizing Provider  acetaminophen-codeine (TYLENOL #3) 300-30 MG tablet Take 1 tablet by mouth 3 (three) times daily as needed for moderate pain. Reported on  11/07/2015 09/19/15   Historical Provider, MD  aspirin EC 81 MG EC tablet Take 1 tablet (81 mg total) by mouth daily. 09/23/15   Erma Heritage, PA  clopidogrel (PLAVIX) 75 MG tablet Take 75 mg by mouth daily.    Historical Provider, MD  gabapentin (NEURONTIN) 300 MG capsule Take 1,200 mg by mouth at bedtime.  09/20/15   Historical Provider, MD  HYDROcodone-acetaminophen (NORCO/VICODIN) 5-325 MG tablet Take 1 tablet by mouth 2 (two) times daily as needed for moderate pain.    Historical Provider, MD  levothyroxine (SYNTHROID, LEVOTHROID) 50 MCG tablet Take 50 mcg by mouth daily before breakfast.    Historical Provider, MD  nitroGLYCERIN (NITROSTAT) 0.4 MG SL tablet Place 1 tablet (0.4 mg total) under the tongue every 5 (five) minutes x 3 doses as needed for chest pain. 09/23/15   Erma Heritage, PA  omeprazole (PRILOSEC) 20 MG capsule Take 20 mg by mouth daily as needed (indigestion).    Historical Provider, MD  PROVENTIL HFA 108 (90 BASE) MCG/ACT inhaler Take 1-2 puffs by mouth every 6 (six) hours as needed for wheezing or shortness of breath. Reported on 02/01/2016 09/18/15   Historical Provider, MD  rosuvastatin (CRESTOR) 20 MG tablet Take 10 mg by mouth at bedtime.    Historical Provider, MD    Physical Exam: Vitals:   11/17/16 0300 11/17/16 0315 11/17/16 0330 11/17/16 0345  BP: (!) 117/53 (!) 108/50 124/64 127/70  Pulse: 66 65 66 64  Resp: '17 17 15 14  '$ Temp:      TempSrc:      SpO2: 95% 94% 94% 95%  Weight:      Height:       General: Not in acute distress. Pale looking. HEENT:       Eyes: PERRL, EOMI, no scleral icterus.       ENT: No discharge from the ears and nose, no pharynx injection, no tonsillar enlargement.        Neck: No JVD, no bruit, no mass felt. Heme: No neck lymph node enlargement. Cardiac: S1/S2, RRR, No murmurs, No gallops or rubs. Respiratory: No rales, wheezing, rhonchi or rubs. GI: Soft, nondistended, nontender, no rebound pain, no organomegaly, BS  present. GU: No hematuria Ext: No pitting leg edema bilaterally. 2+DP/PT pulse bilaterally. Musculoskeletal: No joint deformities, No joint redness or warmth, no limitation of ROM in spin. Skin: No rashes.  Neuro: Alert, oriented X3, cranial nerves II-XII grossly intact, moves all extremities normally. Psych: Patient is not psychotic, no suicidal or hemocidal ideation.  Labs on Admission: I have personally reviewed following labs and imaging studies  CBC:  Recent Labs Lab 11/13/16 0924 11/16/16 1730 11/17/16 0340  WBC 4.8 5.2 5.7  NEUTROABS 3.5  --   --   HGB 6.4* 6.4* 7.8*  HCT 23.7* 23.3* 26.3*  MCV 68.7* 69.1* 71.9*  PLT 284 302 960   Basic Metabolic Panel:  Recent Labs Lab 11/13/16 0924 11/16/16  1730  NA 138 139  K 4.0 4.0  CL 107 109  CO2 26 23  GLUCOSE 117* 95  BUN 8 7  CREATININE 0.98 0.99  CALCIUM 9.4 9.5   GFR: Estimated Creatinine Clearance: 63.5 mL/min (by C-G formula based on SCr of 0.99 mg/dL). Liver Function Tests:  Recent Labs Lab 11/13/16 0924 11/16/16 1730  AST 19 17  ALT 12* 12*  ALKPHOS 48 48  BILITOT 0.3 0.5  PROT 6.9 6.6  ALBUMIN 3.9 3.9   No results for input(s): LIPASE, AMYLASE in the last 168 hours. No results for input(s): AMMONIA in the last 168 hours. Coagulation Profile:  Recent Labs Lab 11/13/16 0924 11/17/16 0340  INR 1.09 1.19   Cardiac Enzymes: No results for input(s): CKTOTAL, CKMB, CKMBINDEX, TROPONINI in the last 168 hours. BNP (last 3 results) No results for input(s): PROBNP in the last 8760 hours. HbA1C: No results for input(s): HGBA1C in the last 72 hours. CBG: No results for input(s): GLUCAP in the last 168 hours. Lipid Profile: No results for input(s): CHOL, HDL, LDLCALC, TRIG, CHOLHDL, LDLDIRECT in the last 72 hours. Thyroid Function Tests: No results for input(s): TSH, T4TOTAL, FREET4, T3FREE, THYROIDAB in the last 72 hours. Anemia Panel:  Recent Labs  11/17/16 0340  RETICCTPCT 1.8   Urine  analysis:    Component Value Date/Time   COLORURINE YELLOW 11/13/2016 0925   APPEARANCEUR CLEAR 11/13/2016 0925   LABSPEC 1.018 11/13/2016 0925   PHURINE 6.0 11/13/2016 0925   GLUCOSEU NEGATIVE 11/13/2016 0925   HGBUR NEGATIVE 11/13/2016 0925   BILIRUBINUR NEGATIVE 11/13/2016 0925   KETONESUR NEGATIVE 11/13/2016 0925   PROTEINUR NEGATIVE 11/13/2016 0925   UROBILINOGEN 0.2 05/25/2007 2311   NITRITE NEGATIVE 11/13/2016 0925   LEUKOCYTESUR NEGATIVE 11/13/2016 0925   Sepsis Labs: '@LABRCNTIP'$ (procalcitonin:4,lacticidven:4) ) Recent Results (from the past 240 hour(s))  Surgical pcr screen     Status: None   Collection Time: 11/13/16  9:23 AM  Result Value Ref Range Status   MRSA, PCR NEGATIVE NEGATIVE Final   Staphylococcus aureus NEGATIVE NEGATIVE Final    Comment:        The Xpert SA Assay (FDA approved for NASAL specimens in patients over 29 years of age), is one component of a comprehensive surveillance program.  Test performance has been validated by [facility] for patients greater than or equal to 57 year old. It is not intended to diagnose infection nor to guide or monitor treatment.      Radiological Exams on Admission: No results found.   EKG: Not done in ED, will get one.   Assessment/Plan Principal Problem:   Symptomatic anemia Active Problems:   Essential hypertension   Hyperlipidemia   CAD (coronary artery disease)   GERD (gastroesophageal reflux disease)   Hypothyroidism   Abdominal pain   Symptomatic anemia: hgb 11.1-->6.4. Source of bleeding is not clear. FOBT is negative. It is possible that patient may have had chronic GI bleeding due to ASA and plavix use. Patient states that she had EGD approximately 3 years ago which was negative except for hiatal hernia. She also had a colonoscopy, found to have 3 small polyps which were removed.  - will place on tele bed for - NPO - NS at 100 mL/hr - Transfuse 2 units of blood - Start IV pantoprazole 40  mg bib - Avoid NSAIDs and SQ heparin - Maintain IV access (2 large bore IVs if possible). - Monitor closely and follow q6h cbc, transfuse as necessary. - check  LDH and haptoglobin and anemia panel - please call GI in AM.  Abdominal pain: Etiology is not clear. Differential diagnosis include peptic ulcer disease and metastasize disease -check lipase -CT-abd/pelvis  -prn Norco for pain  Back pain -prn Norco  HTN: Patient is not taking blood pressure medications at home. Pressure is 145/72 -IV hydralazine when necessary  HLD: Last LDL was 69 on 10/16/15  -Continue home medications: crestor -Check FLP  CAD (coronary artery disease): s/p of EDS. No Cp -Hold ASA and plavix -continue prn NTG, crestor  GERD: -On Protonix  Hypothyroidism: Last TSH was 2.091 on 09/21/15 -Continue home Synthroid   DVT ppx: SCD Code Status: Full code Family Communication: None at bed side.  Disposition Plan:  Anticipate discharge back to previous home environment Consults called:  none Admission status: Obs / tele    Date of Service 11/17/2016    Ivor Costa [facility] Pager [phone]  If 7PM-7AM, please contact night-coverage [email or web address] Password [email or web address] 11/17/2016, 4:28 AM

## 2016-11-16 NOTE — ED Provider Notes (Signed)
[facility] DEPT Provider Note   CSN: [id number] Arrival date & time: 11/16/16  1541    History   Chief Complaint Chief Complaint  Patient presents with  . Abnormal Lab    HPI Gregory Scott is a 75 y.o. male.  75 year old male with a history of myocardial infarction, emphysema, esophageal reflux, and coronary artery disease presents to the emergency department for evaluation of anemia. Patient states that he is having blood drawn for routine preoperative testing when he was told his hemoglobin was low. Patient was taking Plavix until 1 week ago. He has been taking iron tablets recently for new anemia. He has noticed some darkening in his stool since beginning daily iron regimen, but denies any melena or hematochezia prior to this. He has not had any new or worsening shortness of breath, lightheadedness, hematemesis or hemoptysis, syncope or near-syncope. No fevers. Last colonoscopy and endoscopy were approximately 3-4 years ago. Patient denies any complications; 3 polyps were removed. He does have a known hiatal hernia. No hx of GI bleed. Patient does report some abdominal discomfort after eating recently, but this usually resolves with Gas-X. He was told to come to the ED for persistent anemia at the request of his MD at the [facility] hospital.      Past Medical History:  Diagnosis Date  . Acute MI anterior wall first episode care ([place]) 09/21/2015  . Arthritis    back  . Coronary artery disease   . Dyspnea   . Emphysema lung ([place])   . GERD (gastroesophageal reflux disease)   . Hypothyroidism   . Lung cancer ([place])    small part in the right lower   . Prostate cancer ([place])   . PTSD (post-traumatic stress disorder)   . Sleep apnea    sleep study    Patient Active Problem List   Diagnosis Date Noted  . Symptomatic anemia 11/16/2016  . CAD (coronary artery disease) 11/16/2016  . GERD (gastroesophageal reflux disease) 11/16/2016  . ST elevation myocardial infarction (STEMI)  ([place])   . Essential hypertension   . Hyperlipidemia   . Acute MI anterior wall first episode care ([place]) 09/21/2015  . Acute MI, anterolateral wall, initial episode of care [facility]) 09/21/2015    Past Surgical History:  Procedure Laterality Date  . CARDIAC CATHETERIZATION N/A 09/21/2015   Procedure: Left Heart Cath and Coronary Angiography;  Surgeon: Sherren Mocha, MD;  Location: [place] CV LAB;  Service: Cardiovascular;  Laterality: N/A;  . CARDIAC CATHETERIZATION Right 09/21/2015   Procedure: Coronary Stent Intervention;  Surgeon: Sherren Mocha, MD;  Location: [place] CV LAB;  Service: Cardiovascular;  Laterality: Right;  . COLONOSCOPY    . knee surgery    . SHOULDER SURGERY    . TONSILLECTOMY         Home Medications    Prior to Admission medications   Medication Sig Start Date End Date Taking? Authorizing Provider  acetaminophen-codeine (TYLENOL #3) 300-30 MG tablet Take 1 tablet by mouth 3 (three) times daily as needed for moderate pain. Reported on 11/07/2015 09/19/15  Yes Historical Provider, MD  aspirin EC 81 MG EC tablet Take 1 tablet (81 mg total) by mouth daily. 09/23/15  Yes Erma Heritage, PA  clopidogrel (PLAVIX) 75 MG tablet Take 75 mg by mouth daily.   Yes Historical Provider, MD  gabapentin (NEURONTIN) 300 MG capsule Take 1,200 mg by mouth at bedtime.  09/20/15  Yes Historical Provider, MD  HYDROcodone-acetaminophen (NORCO/VICODIN) 5-325 MG tablet Take  1 tablet by mouth 2 (two) times daily as needed for moderate pain.   Yes Historical Provider, MD  levothyroxine (SYNTHROID, LEVOTHROID) 50 MCG tablet Take 50 mcg by mouth daily before breakfast.   Yes Historical Provider, MD  nitroGLYCERIN (NITROSTAT) 0.4 MG SL tablet Place 1 tablet (0.4 mg total) under the tongue every 5 (five) minutes x 3 doses as needed for chest pain. 09/23/15  Yes Erma Heritage, PA  omeprazole (PRILOSEC) 20 MG capsule Take 20 mg by mouth daily as needed (indigestion).   Yes Historical  Provider, MD  PROVENTIL HFA 108 (90 BASE) MCG/ACT inhaler Take 1-2 puffs by mouth every 6 (six) hours as needed for wheezing or shortness of breath. Reported on 02/01/2016 09/18/15  Yes Historical Provider, MD  rosuvastatin (CRESTOR) 20 MG tablet Take 10 mg by mouth at bedtime.   Yes Historical Provider, MD    Family History Family History  Problem Relation Age of Onset  . Coronary artery disease Mother 60    Social History Social History  Substance Use Topics  . Smoking status: Never Smoker  . Smokeless tobacco: Never Used  . Alcohol use No     Allergies   Statins   Review of Systems Review of Systems Ten systems reviewed and are negative for acute change, except as noted in the HPI.    Physical Exam Updated Vital Signs BP 152/74   Pulse 83   Temp 98.7 F (37.1 C) (Oral)   Resp 15   Ht '5\' 9"'$  (1.753 m)   Wt 76.2 kg   SpO2 100%   BMI 24.81 kg/m   Physical Exam  Constitutional: He is oriented to person, place, and time. He appears well-developed and well-nourished. No distress.  Nontoxic appearing and pleasant.  HENT:  Head: Normocephalic and atraumatic.  Eyes: Conjunctivae and EOM are normal. No scleral icterus.  Neck: Normal range of motion.  Cardiovascular: Normal rate, regular rhythm and intact distal pulses.   Pulmonary/Chest: Effort normal. No respiratory distress. He has no wheezes.  Respirations even and unlabored  Genitourinary:  Genitourinary Comments: Normal rectal tone on chaperoned DRE. No gross blood or melena appreciated. Nontender external hemorrhoids.  Musculoskeletal: Normal range of motion.  Neurological: He is alert and oriented to person, place, and time. He exhibits normal muscle tone. Coordination normal.  GCS 15. Patient moving all extremities.  Skin: Skin is warm and dry. No rash noted. He is not diaphoretic. No erythema.  Psychiatric: He has a normal mood and affect. His behavior is normal.  Nursing note and vitals reviewed.    ED  Treatments / Results  Labs (all labs ordered are listed, but only abnormal results are displayed) Labs Reviewed  COMPREHENSIVE METABOLIC PANEL - Abnormal; Notable for the following:       Result Value   ALT 12 (*)    All other components within normal limits  CBC - Abnormal; Notable for the following:    RBC 3.37 (*)    Hemoglobin 6.4 (*)    HCT 23.3 (*)    MCV 69.1 (*)    MCH 19.0 (*)    MCHC 27.5 (*)    RDW 17.7 (*)    All other components within normal limits  LACTATE DEHYDROGENASE  HAPTOGLOBIN  VITAMIN B12  FOLATE  IRON AND TIBC  FERRITIN  RETICULOCYTES  POC OCCULT BLOOD, ED  TYPE AND SCREEN  PREPARE RBC (CROSSMATCH)    EKG  EKG Interpretation None       Radiology No  results found.  Procedures Procedures (including critical care time)  Medications Ordered in ED Medications  pantoprazole (PROTONIX) injection 40 mg (40 mg Intravenous Given 11/16/16 2207)  0.9 %  sodium chloride infusion (10 mL/hr Intravenous New Bag/Given 11/16/16 2206)    CRITICAL CARE Performed by: Antonietta Breach   Total critical care time: 35 minutes  Critical care time was exclusive of separately billable procedures and treating other patients.  Critical care was necessary to treat or prevent imminent or life-threatening deterioration.  Critical care was time spent personally by me on the following activities: development of treatment plan with patient and/or surrogate as well as nursing, discussions with consultants, evaluation of patient's response to treatment, examination of patient, obtaining history from patient or surrogate, ordering and performing treatments and interventions, ordering and review of laboratory studies, ordering and review of radiographic studies, pulse oximetry and re-evaluation of patient's condition.   Initial Impression / Assessment and Plan / ED Course  I have reviewed the triage vital signs and the nursing notes.  Pertinent labs & imaging results that  were available during my care of the patient were reviewed by me and considered in my medical decision making (see chart for details).  Clinical Course     75 year old male to be admitted for anemia of unknown etiology. It is unclear as to whether blood loss may be due to underlying GI bleed. Initial Hemoccult is negative. Patient with no complaints. He denies lightheadedness and shortness of breath. No hypotension or tachycardia; vitals stable. Will transfuse with PRBCs given hemoglobin is 6.4, down from baseline of 11-12. Case discussed with Dr. Blaine Hamper who will admit.   Final Clinical Impressions(s) / ED Diagnoses   Final diagnoses:  Anemia, unspecified type    New Prescriptions New Prescriptions   No medications on file     Antonietta Breach, PA-C 11/16/16 Livingston Wheeler, MD 11/18/16 1014

## 2016-11-17 ENCOUNTER — Observation Stay (HOSPITAL_COMMUNITY): Payer: Medicare Other

## 2016-11-17 ENCOUNTER — Other Ambulatory Visit: Payer: Self-pay

## 2016-11-17 DIAGNOSIS — K219 Gastro-esophageal reflux disease without esophagitis: Secondary | ICD-10-CM | POA: Diagnosis not present

## 2016-11-17 DIAGNOSIS — D649 Anemia, unspecified: Secondary | ICD-10-CM

## 2016-11-17 DIAGNOSIS — E039 Hypothyroidism, unspecified: Secondary | ICD-10-CM

## 2016-11-17 DIAGNOSIS — D509 Iron deficiency anemia, unspecified: Secondary | ICD-10-CM | POA: Diagnosis not present

## 2016-11-17 DIAGNOSIS — I10 Essential (primary) hypertension: Secondary | ICD-10-CM | POA: Diagnosis not present

## 2016-11-17 DIAGNOSIS — N200 Calculus of kidney: Secondary | ICD-10-CM | POA: Diagnosis not present

## 2016-11-17 DIAGNOSIS — E785 Hyperlipidemia, unspecified: Secondary | ICD-10-CM

## 2016-11-17 LAB — BASIC METABOLIC PANEL
ANION GAP: 6 (ref 5–15)
BUN: 8 mg/dL (ref 6–20)
CO2: 22 mmol/L (ref 22–32)
Calcium: 8.9 mg/dL (ref 8.9–10.3)
Chloride: 109 mmol/L (ref 101–111)
Creatinine, Ser: 0.98 mg/dL (ref 0.61–1.24)
GFR calc Af Amer: >60 mL/min (ref >60)
Glucose, Bld: 99 mg/dL (ref 65–99)
POTASSIUM: 4 mmol/L (ref 3.5–5.1)
SODIUM: 137 mmol/L (ref 135–145)

## 2016-11-17 LAB — CBC
HCT: 28.8 % — ABNORMAL LOW (ref 39.0–52.0)
HEMATOCRIT: 26.3 % — AB (ref 39.0–52.0)
HEMOGLOBIN: 7.8 g/dL — AB (ref 13.0–17.0)
Hemoglobin: 8.4 g/dL — ABNORMAL LOW (ref 13.0–17.0)
MCH: 21.3 pg — ABNORMAL LOW (ref 26.0–34.0)
MCH: 21.4 pg — ABNORMAL LOW (ref 26.0–34.0)
MCHC: 29.7 g/dL — ABNORMAL LOW (ref 30.0–36.0)
MCHC: 29.2 g/dL — AB (ref 30.0–36.0)
MCV: 71.9 fL — ABNORMAL LOW (ref 78.0–100.0)
MCV: 73.5 fL — ABNORMAL LOW (ref 78.0–100.0)
PLATELETS: 238 10*3/uL (ref 150–400)
Platelets: 229 10*3/uL (ref 150–400)
RBC: 3.66 MIL/uL — ABNORMAL LOW (ref 4.22–5.81)
RBC: 3.92 MIL/uL — ABNORMAL LOW (ref 4.22–5.81)
RDW: 19.2 % — AB (ref 11.5–15.5)
RDW: 19.2 % — AB (ref 11.5–15.5)
WBC: 5.7 10*3/uL (ref 4.0–10.5)
WBC: 4.5 10*3/uL (ref 4.0–10.5)

## 2016-11-17 LAB — PROTIME-INR
INR: 1.19
Prothrombin Time: 15.2 seconds (ref 11.4–15.2)

## 2016-11-17 LAB — LACTATE DEHYDROGENASE: LDH: 105 U/L (ref 98–192)

## 2016-11-17 LAB — IRON AND TIBC
Iron: 30 ug/dL — ABNORMAL LOW (ref 45–182)
SATURATION RATIOS: 6 % — AB (ref 17.9–39.5)
TIBC: 483 ug/dL — ABNORMAL HIGH (ref 250–450)
UIBC: 453 ug/dL

## 2016-11-17 LAB — CBG MONITORING, ED: Glucose-Capillary: 102 mg/dL — ABNORMAL HIGH (ref 65–99)

## 2016-11-17 LAB — RETICULOCYTES
RBC.: 3.56 MIL/uL — AB (ref 4.22–5.81)
RETIC CT PCT: 1.8 % (ref 0.4–3.1)
Retic Count, Absolute: 64.1 10*3/uL (ref 19.0–186.0)

## 2016-11-17 LAB — PREPARE RBC (CROSSMATCH)

## 2016-11-17 LAB — VITAMIN B12: Vitamin B-12: 141 pg/mL — ABNORMAL LOW (ref 180–914)

## 2016-11-17 LAB — APTT: APTT: 29 s (ref 24–36)

## 2016-11-17 LAB — FOLATE: Folate: 14.9 ng/mL (ref >5.9)

## 2016-11-17 LAB — FERRITIN: Ferritin: 10 ng/mL — ABNORMAL LOW (ref 24–336)

## 2016-11-17 MED ORDER — IOPAMIDOL (ISOVUE-300) INJECTION 61%
INTRAVENOUS | Status: AC
  Administered 2016-11-17: 05:00:00
  Filled 2016-11-17: qty 100

## 2016-11-17 MED ORDER — SODIUM CHLORIDE 0.9 % IV SOLN
510.0000 mg | Freq: Once | INTRAVENOUS | Status: AC
  Administered 2016-11-17: 510 mg via INTRAVENOUS
  Filled 2016-11-17: qty 17

## 2016-11-17 MED ORDER — SODIUM CHLORIDE 0.9 % IV SOLN
Freq: Once | INTRAVENOUS | Status: AC
  Administered 2016-11-17: 13:00:00 via INTRAVENOUS

## 2016-11-17 NOTE — ED Notes (Addendum)
Dr Wynelle Cleveland called - inquiring if pt received 2 units of PRBC's and when completed. Requesting CBC to be performed 2 hours after completion - ordering another CBC. Ruthann Cancer, Agricultural consultant, assisting RN w/researching. Lab advised have 2 units of PRBC's ready for pt.

## 2016-11-17 NOTE — ED Notes (Signed)
Offered pt Norco for chronic back pain prior to being d/c'd to home - pt is scheduled for back surgery this week. Pt declined. Spouse and daughter assisting pt w/getting dressed.

## 2016-11-17 NOTE — ED Notes (Signed)
Spouse at bedside

## 2016-11-17 NOTE — ED Notes (Signed)
Heart Healthy Diet ordered for pt.

## 2016-11-17 NOTE — ED Notes (Signed)
Feraheme infusion completed. Pt tolerated well - denies any c/o shortness of breath, new pain, problems breathing/talking. Family w/pt.

## 2016-11-17 NOTE — ED Notes (Signed)
Patient transported to CT 

## 2016-11-17 NOTE — Discharge Summary (Addendum)
Physician Discharge Summary  TAKSH HJORT LDJ:[id number] DOB: 11/18/1941 DOA: 11/16/2016  PCP: Cristina Gong, MD  Admit date: 11/16/2016 Discharge date: 11/17/2016  Admitted From: home Disposition:  home   Recommendations for Outpatient Follow-up:  F/u lung nodule F/u Hb, Iron levels and stool occults as outpt   Discharge Condition:  stable   CODE STATUS:  Full code   Diet recommendation:  Heart healthy Consultations:  none    Discharge Diagnoses:  Principal Problem:   Symptomatic anemia Active Problems:   Essential hypertension   Hyperlipidemia   CAD (coronary artery disease)   GERD (gastroesophageal reflux disease)   Hypothyroidism   Abdominal pain    Subjective: No complaints of pain, nausea, vomiting. No blood in stool. Stool continues to be black.   Brief Summary: Gregory Scott is a 75 y.o. male with medical history significant of  Hypertension, hyperlipidemia, GERD, hypothyroidism, prostate cancer (s/p of radiation seeds 2010), CAD, s/p of EDS, PTSD, back pain, who present with fatigue and low Hgb.  Pt state that he has chronic lumbar disc problem and is scheduled for lumbar surgery.  He had blood test done in [place] for pre-op for back surgery next week. He was found to have Hgb 6.0 and therefore was sent to ED. Pt states that he has fatigue recently. He denies CP, SOB, hematochezia, hematemesis, hematuria. He states that he has mild abdominal pain for about 2 months. It is located in the mid abdomen, 2/10 in severity, intermittent. It is not aggravated or alleviated by any factors. No nausea, vomiting, diarrhea. Denies symptoms of UTI. No unilateral weakness. Pt was on ASA and plavix which were held since Thursday for preparation of surgery.   He was admitted last night by my partner and has received 2 U PRBC and is stable for discharge and close oupt follow up.   Hospital Course:  Anemia- iron deficiency - he has not noted any blood in stool, no  hematemesis - hemoccult is negative although stool is black- he was started on Iron about a week ago which would account for black stool this week.  - 2 U PRBC transfused bringing Hb up to 8.4 - Iron deficiency noted- have giving him 550 mg of Feraheme and advised he continue home dose of Oral Iron - CT abdomen/pelvis shows not etiology for bleed - no further work up at this time- cont PPI for h/o GERD - recommend close watch on Hb and stool occults- he is to go back on ASA and Plavix after surgery- I have recommended to f/u with Dr Terrence Dupont in 1-2  about continuing Plavix as he states he was supposed to be taking it for 1 yr and it has now been about 14 months  Lung nodule - seen incidentally on CT abd/pelvis- patient and family state they and his doctors are aware and are following up on it  Back pain - due for surgery later this week   All other medical problems are stable.   Discharge Instructions  Discharge Instructions    Diet - low sodium heart healthy    Complete by:  As directed    Increase activity slowly    Complete by:  As directed      Allergies as of 11/17/2016      Reactions   Statins    Legs hurt in high doses      Medication List    STOP taking these medications   aspirin 81 MG EC tablet   clopidogrel 75  MG tablet Commonly known as:  PLAVIX     TAKE these medications   acetaminophen-codeine 300-30 MG tablet Commonly known as:  TYLENOL #3 Take 1 tablet by mouth 3 (three) times daily as needed for moderate pain. Reported on 11/07/2015   gabapentin 300 MG capsule Commonly known as:  NEURONTIN Take 1,200 mg by mouth at bedtime.   HYDROcodone-acetaminophen 5-325 MG tablet Commonly known as:  NORCO/VICODIN Take 1 tablet by mouth 2 (two) times daily as needed for moderate pain.   levothyroxine 50 MCG tablet Commonly known as:  SYNTHROID, LEVOTHROID Take 50 mcg by mouth daily before breakfast.   nitroGLYCERIN 0.4 MG SL tablet Commonly known as:   NITROSTAT Place 1 tablet (0.4 mg total) under the tongue every 5 (five) minutes x 3 doses as needed for chest pain.   omeprazole 20 MG capsule Commonly known as:  PRILOSEC Take 20 mg by mouth daily as needed (indigestion).   PROVENTIL HFA 108 (90 Base) MCG/ACT inhaler Generic drug:  albuterol Take 1-2 puffs by mouth every 6 (six) hours as needed for wheezing or shortness of breath. Reported on 02/01/2016   rosuvastatin 20 MG tablet Commonly known as:  CRESTOR Take 10 mg by mouth at bedtime.      Follow-up Information    Guhu,Bhuvana, MD Follow up in 1 week(s).   Specialty:  Internal Medicine Contact information: [place] [address] [phone]        Charolette Forward, MD Follow up in 2 day(s).   Specialty:  Cardiology Contact information: [place] [address] [phone]          Allergies  Allergen Reactions  . Statins     Legs hurt in high doses     Procedures/Studies:   Dg Chest 2 View  Result Date: 11/13/2016 CLINICAL DATA:  Preop evaluation for upcoming lumbar surgery EXAM: CHEST  2 VIEW COMPARISON:  05/30/2007 FINDINGS: The heart size and mediastinal contours are within normal limits. Both lungs are clear. The visualized skeletal structures are unremarkable. Small hiatal hernia is noted. IMPRESSION: No active cardiopulmonary disease. Electronically Signed   By: Inez Catalina M.D.   On: 11/13/2016 09:48   Ct Abdomen Pelvis W Contrast  Result Date: 11/17/2016 CLINICAL DATA:  Mid abdominal pain.  Possible GI bleed. EXAM: CT ABDOMEN AND PELVIS WITH CONTRAST TECHNIQUE: Multidetector CT imaging of the abdomen and pelvis was performed using the standard protocol following bolus administration of intravenous contrast. CONTRAST:  100 cc Isovue-300 IV COMPARISON:  CT 02/17/2015 PET-CT 03/04/2015 FINDINGS: Lower chest: The right lower lobe pulmonary nodule is obscured by adjacent atelectasis. No pleural fluid.  Small to moderate hiatal hernia. Hepatobiliary: Decreased hepatic density consistent with steatosis. Exophytic 18 mm lesion from the left lobe is unchanged. Gallbladder is decompressed, no calcified gallstone. No biliary dilatation. Pancreas: Cystic lesion in the head of the pancreas measures 1.5 cm, unchanged from prior exam dating back to 2011 and considered benign. No ductal dilatation or inflammation. Spleen: Enlarged measuring 13.4 cm cranial caudal. No focal abnormality. Splenule is noted anterior superiorly. Adrenals/Urinary Tract: No adrenal nodule. Symmetric renal enhancement and excretion on delayed phase imaging. No hydronephrosis or perinephric edema. Two nonobstructing stones in the lower left kidney. Small cyst in the mid anterior left kidney. No ureteral stone or dilatation. Urinary bladder is distended without wall thickening. Stomach/Bowel: Small to moderate hiatal hernia. Stomach is otherwise decompressed. No bowel wall thickening, distention, or inflammation. No significant diverticular disease.  Normal appendix. Vascular/Lymphatic: Atherosclerosis of the abdominal aorta without aneurysm. No acute vascular finding. No abdominal or pelvic adenopathy. Reproductive: Brachytherapy seeds in the prostate gland. Other: Fat within both inguinal canals. No free air, free fluid, or intra-abdominal fluid collection. 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 Sclerotic density in the right acetabulum is unchanged dating back to 2011 and considered benign bone island. There are no acute or suspicious osseous abnormalities. IMPRESSION: 1. No explanation for possible GI bleed and abdominal pain. 2. Mild splenomegaly. 3. The right lower lobe pulmonary nodule is obscured by adjacent atelectasis. 4. Nonobstructing left nephrolithiasis. 5. Additional stable chronic findings as described. Electronically Signed   By: Jeb Levering M.D.   On: 11/17/2016 05:35        Discharge Exam: Vitals:   11/17/16 1400 11/17/16 1411  BP: 146/69 148/70  Pulse: 72 71  Resp: 23 18  Temp:     Vitals:   11/17/16 1300 11/17/16 1330 11/17/16 1400 11/17/16 1411  BP: 145/78 132/61 146/69 148/70  Pulse: 62 69 72 71  Resp: '18 20 23 18  '$ Temp:      TempSrc:      SpO2: 97% 99% 97% 97%  Weight:      Height:        General: Pt is alert, awake, not in acute distress Cardiovascular: RRR, S1/S2 +, no rubs, no gallops Respiratory: CTA bilaterally, no wheezing, no rhonchi Abdominal: Soft, NT, ND, bowel sounds + Extremities: no edema, no cyanosis    The results of significant diagnostics from this hospitalization (including imaging, microbiology, ancillary and laboratory) are listed below for reference.     Microbiology: Recent Results (from the past 240 hour(s))  Surgical pcr screen     Status: None   Collection Time: 11/13/16  9:23 AM  Result Value Ref Range Status   MRSA, PCR NEGATIVE NEGATIVE Final   Staphylococcus aureus NEGATIVE NEGATIVE Final    Comment:        The Xpert SA Assay (FDA approved for NASAL specimens in patients over 35 years of age), is one component of a comprehensive surveillance program.  Test performance has been validated by [facility] for patients greater than or equal to 37 year old. It is not intended to diagnose infection nor to guide or monitor treatment.      Labs: BNP (last 3 results) No results for input(s): BNP in the last 8760 hours. Basic Metabolic Panel:  Recent Labs Lab 11/13/16 0924 11/16/16 1730 11/17/16 0340  NA 138 139 137  K 4.0 4.0 4.0  CL 107 109 109  CO2 '26 23 22  '$ GLUCOSE 117* 95 99  BUN '8 7 8  '$ CREATININE 0.98 0.99 0.98  CALCIUM 9.4 9.5 8.9   Liver Function Tests:  Recent Labs Lab 11/13/16 0924 11/16/16 1730  AST 19 17  ALT 12* 12*  ALKPHOS 48 48  BILITOT 0.3 0.5  PROT 6.9 6.6  ALBUMIN 3.9 3.9   No results for input(s): LIPASE, AMYLASE in the last 168 hours. No results  for input(s): AMMONIA in the last 168 hours. CBC:  Recent Labs Lab 11/13/16 0924 11/16/16 1730 11/17/16 0340 11/17/16 0948  WBC 4.8 5.2 5.7 4.5  NEUTROABS 3.5  --   --   --   HGB 6.4* 6.4* 7.8* 8.4*  HCT 23.7* 23.3* 26.3* 28.8*  MCV 68.7* 69.1* 71.9* 73.5*  PLT 284 302 229 238   Cardiac Enzymes: No results for input(s): CKTOTAL, CKMB,  CKMBINDEX, TROPONINI in the last 168 hours. BNP: Invalid input(s): POCBNP CBG:  Recent Labs Lab 11/17/16 0821  GLUCAP 102*   D-Dimer No results for input(s): DDIMER in the last 72 hours. Hgb A1c No results for input(s): HGBA1C in the last 72 hours. Lipid Profile No results for input(s): CHOL, HDL, LDLCALC, TRIG, CHOLHDL, LDLDIRECT in the last 72 hours. Thyroid function studies No results for input(s): TSH, T4TOTAL, T3FREE, THYROIDAB in the last 72 hours.  Invalid input(s): FREET3 Anemia work up  Recent Labs  11/17/16 0340  VITAMINB12 141*  FOLATE 14.9  FERRITIN 10*  TIBC 483*  IRON 30*  RETICCTPCT 1.8   Urinalysis    Component Value Date/Time   COLORURINE YELLOW 11/13/2016 0925   APPEARANCEUR CLEAR 11/13/2016 0925   LABSPEC 1.018 11/13/2016 0925   PHURINE 6.0 11/13/2016 0925   GLUCOSEU NEGATIVE 11/13/2016 0925   HGBUR NEGATIVE 11/13/2016 0925   BILIRUBINUR NEGATIVE 11/13/2016 0925   KETONESUR NEGATIVE 11/13/2016 0925   PROTEINUR NEGATIVE 11/13/2016 0925   UROBILINOGEN 0.2 05/25/2007 2311   NITRITE NEGATIVE 11/13/2016 0925   LEUKOCYTESUR NEGATIVE 11/13/2016 0925   Sepsis Labs Invalid input(s): PROCALCITONIN,  WBC,  LACTICIDVEN Microbiology Recent Results (from the past 240 hour(s))  Surgical pcr screen     Status: None   Collection Time: 11/13/16  9:23 AM  Result Value Ref Range Status   MRSA, PCR NEGATIVE NEGATIVE Final   Staphylococcus aureus NEGATIVE NEGATIVE Final    Comment:        The Xpert SA Assay (FDA approved for NASAL specimens in patients over 61 years of age), is one component of a comprehensive  surveillance program.  Test performance has been validated by [facility] for patients greater than or equal to 26 year old. It is not intended to diagnose infection nor to guide or monitor treatment.      Time coordinating discharge: Over 30 minutes  SIGNED:   Debbe Odea, MD  [facility] 11/17/2016, 2:25 PM Pager   If 7PM-7AM, please contact night-coverage [email or web address] Password [email or web address]

## 2016-11-17 NOTE — ED Notes (Signed)
Pt lying on stretcher watching tv. Aware breakfast has been ordered. Offered pt po fluids - declined at this time.

## 2016-11-17 NOTE — ED Notes (Signed)
Per Dr Wynelle Cleveland, d/c IV fluids d/t pt tolerating po fluids/food, HOLD transfusion order d/t Hbg 8.4.

## 2016-11-17 NOTE — ED Notes (Signed)
Paging Admitting MD x 2.  

## 2016-11-17 NOTE — Discharge Instructions (Signed)

## 2016-11-17 NOTE — ED Notes (Signed)
Secretary paging Admitting MD so RN may inquire if pt needs blood transfusion d/t Hgb 8.4.

## 2016-11-17 NOTE — ED Notes (Signed)
Dr Wynelle Cleveland in w/pt and family.

## 2016-11-18 LAB — HAPTOGLOBIN: Haptoglobin: 108 mg/dL (ref 34–200)

## 2016-11-19 LAB — TYPE AND SCREEN
ABO/RH(D): O POS
ANTIBODY SCREEN: NEGATIVE
UNIT DIVISION: 0
Unit division: 0
Unit division: 0
Unit division: 0

## 2016-11-21 ENCOUNTER — Inpatient Hospital Stay (HOSPITAL_COMMUNITY): Admission: RE | Admit: 2016-11-21 | Payer: Medicare Other | Source: Ambulatory Visit | Admitting: Orthopedic Surgery

## 2016-11-21 ENCOUNTER — Encounter (HOSPITAL_COMMUNITY): Admission: RE | Payer: Self-pay | Source: Ambulatory Visit

## 2016-11-21 SURGERY — ANTERIOR LUMBAR FUSION 1 LEVEL
Anesthesia: General

## 2016-11-30 DIAGNOSIS — D649 Anemia, unspecified: Secondary | ICD-10-CM | POA: Diagnosis not present

## 2016-12-17 DIAGNOSIS — C61 Malignant neoplasm of prostate: Secondary | ICD-10-CM | POA: Diagnosis not present

## 2016-12-28 ENCOUNTER — Other Ambulatory Visit: Payer: Self-pay | Admitting: Orthopedic Surgery

## 2017-01-02 ENCOUNTER — Other Ambulatory Visit: Payer: Self-pay

## 2017-01-03 DIAGNOSIS — D649 Anemia, unspecified: Secondary | ICD-10-CM | POA: Diagnosis not present

## 2017-01-07 DIAGNOSIS — I10 Essential (primary) hypertension: Secondary | ICD-10-CM | POA: Diagnosis not present

## 2017-01-07 DIAGNOSIS — M1990 Unspecified osteoarthritis, unspecified site: Secondary | ICD-10-CM | POA: Diagnosis not present

## 2017-01-07 DIAGNOSIS — F4310 Post-traumatic stress disorder, unspecified: Secondary | ICD-10-CM | POA: Diagnosis not present

## 2017-01-07 DIAGNOSIS — E039 Hypothyroidism, unspecified: Secondary | ICD-10-CM | POA: Diagnosis not present

## 2017-01-07 DIAGNOSIS — I252 Old myocardial infarction: Secondary | ICD-10-CM | POA: Diagnosis not present

## 2017-01-07 DIAGNOSIS — Z01810 Encounter for preprocedural cardiovascular examination: Secondary | ICD-10-CM | POA: Diagnosis not present

## 2017-01-07 DIAGNOSIS — I2510 Atherosclerotic heart disease of native coronary artery without angina pectoris: Secondary | ICD-10-CM | POA: Diagnosis not present

## 2017-01-07 DIAGNOSIS — E785 Hyperlipidemia, unspecified: Secondary | ICD-10-CM | POA: Diagnosis not present

## 2017-01-07 DIAGNOSIS — J449 Chronic obstructive pulmonary disease, unspecified: Secondary | ICD-10-CM | POA: Diagnosis not present

## 2017-01-09 ENCOUNTER — Encounter: Payer: Self-pay | Admitting: Vascular Surgery

## 2017-01-15 ENCOUNTER — Encounter: Payer: Self-pay | Admitting: Vascular Surgery

## 2017-01-15 ENCOUNTER — Ambulatory Visit (INDEPENDENT_AMBULATORY_CARE_PROVIDER_SITE_OTHER): Payer: Medicare Other | Admitting: Vascular Surgery

## 2017-01-15 VITALS — BP 122/81 | HR 55 | Temp 98.0°F | Resp 16 | Ht 69.0 in | Wt 169.0 lb

## 2017-01-15 DIAGNOSIS — M5137 Other intervertebral disc degeneration, lumbosacral region: Secondary | ICD-10-CM | POA: Diagnosis not present

## 2017-01-15 NOTE — Progress Notes (Signed)
Vascular and Vein Specialist of [facility]  Patient name: Gregory Scott MRN: [id number] DOB: 1941-08-27 Sex: male  REASON FOR VISIT: Discuss anterior approach for L5-S1 disc surgery  HPI: Gregory Scott is a 76 y.o. male here today for preoperative discussion. I had seen him in mid December with plans for L5-S1 disc surgery on January 3. He was found to have severe anemia and had the transfusion with extensive workup to include upper and lower endoscopy and CT scan. No source was found and was found that his anemia is related to low iron. This has been corrected. He continues to have a severe difficulty and pain related to his disc disease.  Past Medical History:  Diagnosis Date  . 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 Arthritis    back  . Coronary artery disease   . Dyspnea   . Emphysema lung ([place])   . GERD (gastroesophageal reflux disease)   . Hypothyroidism   . Lung cancer ([place])    small part in the right lower   . Prostate cancer ([place])   . PTSD (post-traumatic stress disorder)   . Sleep apnea    sleep study    Family History  Problem Relation Age of Onset  . Coronary artery disease Mother 26    SOCIAL HISTORY: Social History  Substance Use Topics  . Smoking status: Never Smoker  . Smokeless tobacco: Never Used  . Alcohol use No    Allergies  Allergen Reactions  . Statins     Legs hurt in high doses    Current Outpatient Prescriptions  Medication Sig Dispense Refill  . acetaminophen-codeine (TYLENOL #3) 300-30 MG tablet Take 1 tablet by mouth 3 (three) times daily as needed for moderate pain. Reported on 11/07/2015    . aspirin EC 81 MG EC tablet Take 1 tablet (81 mg total) by mouth daily.    Marland Kitchen gabapentin (NEURONTIN) 300 MG capsule Take 1,200 mg by mouth at bedtime.     Marland Kitchen HYDROcodone-acetaminophen (NORCO/VICODIN) 5-325 MG tablet Take 1 tablet by mouth 2 (two) times daily as needed for  moderate pain.    Marland Kitchen levothyroxine (SYNTHROID, LEVOTHROID) 50 MCG tablet Take 50 mcg by mouth daily before breakfast.    . nitroGLYCERIN (NITROSTAT) 0.4 MG SL tablet Place 1 tablet (0.4 mg total) under the tongue every 5 (five) minutes x 3 doses as needed for chest pain. 25 tablet 0  . omeprazole (PRILOSEC) 20 MG capsule Take 20 mg by mouth daily as needed (indigestion).     Marland Kitchen PROVENTIL HFA 108 (90 BASE) MCG/ACT inhaler Take 1-2 puffs by mouth every 6 (six) hours as needed for wheezing or shortness of breath. Reported on 02/01/2016    . rosuvastatin (CRESTOR) 20 MG tablet Take 10 mg by mouth at bedtime.     No current facility-administered medications for this visit.     REVIEW OF SYSTEMS:  '[X]'$  denotes positive finding, '[ ]'$  denotes negative finding Cardiac  Comments:  Chest pain or chest pressure:    Shortness of breath upon exertion:    Short of breath when lying flat:    Irregular heart rhythm:        Vascular    Pain in calf, thigh, or hip brought on by ambulation:    Pain in feet at night that wakes you up from your sleep:     Blood clot in your veins:    Leg swelling:  PHYSICAL EXAM: Vitals:   01/15/17 1006  BP: 122/81  Pulse: (!) 55  Resp: 16  Temp: 98 F (36.7 C)  TempSrc: Oral  SpO2: 99%  Weight: 169 lb (76.7 kg)  Height: '5\' 9"'$  (1.753 m)    GENERAL: The patient is a well-nourished male, in no acute distress. The vital signs are documented above. CARDIOVASCULAR: 2+ radial and 2+ dorsalis pedis pulses PULMONARY: There is good air exchange  MUSCULOSKELETAL: There are no major deformities or cyanosis. NEUROLOGIC: No focal weakness or paresthesias are detected. SKIN: There are no ulcers or rashes noted. PSYCHIATRIC: The patient has a normal affect.  DATA:  I did review his CT scan which was obtained following his last visit with me. This shows minimal calcification and his aortoiliac segments.  MEDICAL ISSUES: Severe degenerative disc disease. Again  discussed my role and exposure with mobilization of intraperitoneal contents, left ureter and arterial venous structures overlying the spine and potential injury for these. I do not see any contraindications for anterior approach. We'll be available for surgery on 3/14 as planned    Rosetta Posner, MD [facility] Vascular and Vein Specialists of [facility] Tel [phone] Pager [phone]

## 2017-01-21 NOTE — Pre-Procedure Instructions (Signed)
    Gregory Scott  01/21/2017      [place] [address], [place] [address] [address] Phone: [phone] Fax: [phone]  [place] [address] [place] [address] [place] [address] Phone: [phone] Fax: [phone]    Your procedure is scheduled on Wednesday, March 14th   Report to [facility] Admitting at 6:30 AM             (posted surgery time 8:30 am - 1:27 pm)   Call this number if you have problems the MORNING of surgery:  [phone], or [place] Fri from 8am - 4:30 pm   Remember:  Do not eat food or drink liquids after midnight Tuesday.   Take these medicines the morning of surgery with A SIP OF WATER : Hydrocodone, Levothyroxine, Omeprazole   Do not wear jewelry - no rings or watches.  Do not wear lotions, colognes or deoderant.             Men may shave face and neck.   Do not bring valuables to the hospital.  [facility] is not responsible for any belongings or valuables.  Contacts, dentures or bridgework may not be worn into surgery.  Leave your suitcase in the car.  After surgery it may be brought to your room. For patients admitted to the hospital, discharge time will be determined by your treatment team.   Please read over the following fact sheets that you were given. Pain Booklet, MRSA Information and Surgical Site Infection Prevention

## 2017-01-22 ENCOUNTER — Encounter (HOSPITAL_COMMUNITY)
Admission: RE | Admit: 2017-01-22 | Discharge: 2017-01-22 | Disposition: A | Discharge status: Home / Self Care | Payer: Medicare Other | Source: Ambulatory Visit | Attending: Orthopedic Surgery | Admitting: Orthopedic Surgery

## 2017-01-22 ENCOUNTER — Encounter (HOSPITAL_COMMUNITY): Payer: Self-pay

## 2017-01-22 DIAGNOSIS — M79605 Pain in left leg: Secondary | ICD-10-CM | POA: Insufficient documentation

## 2017-01-22 DIAGNOSIS — M79604 Pain in right leg: Secondary | ICD-10-CM | POA: Diagnosis not present

## 2017-01-22 DIAGNOSIS — Z01818 Encounter for other preprocedural examination: Secondary | ICD-10-CM | POA: Insufficient documentation

## 2017-01-22 HISTORY — DX: Personal history of urinary calculi: Z87.442

## 2017-01-22 HISTORY — DX: Personal history of other diseases of the digestive system: Z87.19

## 2017-01-22 HISTORY — DX: Anemia, unspecified: D64.9

## 2017-01-22 LAB — CBC WITH DIFFERENTIAL/PLATELET
BASOS ABS: 0 10*3/uL (ref 0.0–0.1)
Basophils Relative: 1 %
Eosinophils Absolute: 0.2 10*3/uL (ref 0.0–0.7)
Eosinophils Relative: 4 %
HEMATOCRIT: 42.5 % (ref 39.0–52.0)
Hemoglobin: 14.3 g/dL (ref 13.0–17.0)
LYMPHS ABS: 1.2 10*3/uL (ref 0.7–4.0)
LYMPHS PCT: 21 %
MCH: 29.3 pg (ref 26.0–34.0)
MCHC: 33.6 g/dL (ref 30.0–36.0)
MCV: 87.1 fL (ref 78.0–100.0)
Monocytes Absolute: 0.5 10*3/uL (ref 0.1–1.0)
Monocytes Relative: 8 %
NEUTROS ABS: 3.7 10*3/uL (ref 1.7–7.7)
Neutrophils Relative %: 66 %
Platelets: 211 10*3/uL (ref 150–400)
RBC: 4.88 MIL/uL (ref 4.22–5.81)
RDW: 20.8 % — ABNORMAL HIGH (ref 11.5–15.5)
WBC: 5.6 10*3/uL (ref 4.0–10.5)

## 2017-01-22 LAB — COMPREHENSIVE METABOLIC PANEL
ALK PHOS: 60 U/L (ref 38–126)
ALT: 26 U/L (ref 17–63)
AST: 44 U/L — AB (ref 15–41)
Albumin: 4 g/dL (ref 3.5–5.0)
Anion gap: 6 (ref 5–15)
BILIRUBIN TOTAL: 0.6 mg/dL (ref 0.3–1.2)
BUN: 8 mg/dL (ref 6–20)
CALCIUM: 9.8 mg/dL (ref 8.9–10.3)
CO2: 25 mmol/L (ref 22–32)
CREATININE: 1.06 mg/dL (ref 0.61–1.24)
Chloride: 106 mmol/L (ref 101–111)
GFR calc Af Amer: >60 mL/min (ref >60)
Glucose, Bld: 108 mg/dL — ABNORMAL HIGH (ref 65–99)
Potassium: 4.2 mmol/L (ref 3.5–5.1)
Sodium: 137 mmol/L (ref 135–145)
TOTAL PROTEIN: 7.2 g/dL (ref 6.5–8.1)

## 2017-01-22 LAB — URINALYSIS, ROUTINE W REFLEX MICROSCOPIC
Bilirubin Urine: NEGATIVE
GLUCOSE, UA: NEGATIVE mg/dL
Ketones, ur: NEGATIVE mg/dL
LEUKOCYTES UA: NEGATIVE
Nitrite: NEGATIVE
PROTEIN: NEGATIVE mg/dL
SPECIFIC GRAVITY, URINE: 1.018 (ref 1.005–1.030)
Squamous Epithelial / LPF: NONE SEEN
pH: 5 (ref 5.0–8.0)

## 2017-01-22 LAB — PROTIME-INR
INR: 1.01
PROTHROMBIN TIME: 13.3 s (ref 11.4–15.2)

## 2017-01-22 LAB — SURGICAL PCR SCREEN
MRSA, PCR: NEGATIVE
Staphylococcus aureus: NEGATIVE

## 2017-01-22 LAB — TYPE AND SCREEN
ABO/RH(D): O POS
Antibody Screen: NEGATIVE

## 2017-01-22 LAB — APTT: APTT: 31 s (ref 24–36)

## 2017-01-22 NOTE — Progress Notes (Signed)
SPOKE WITH CARLA AT DR. Laurena Bering OFFICE WHO STATED SHE WILL CHECK WITH DR. Terrence Dupont ABOUT WEATHER ASPIRIN NEEDS STOPPED AND CALL PATIENT WITH INSTRUCTION.

## 2017-01-22 NOTE — Progress Notes (Addendum)
REQUESTED SLEEP STUDY FROM [facility].  REQUESTED STRESS TEST, CARDIAC CLEARANCE FROM DR. HARWANI.  PCP- DR. Carl [place]

## 2017-01-28 DIAGNOSIS — C61 Malignant neoplasm of prostate: Secondary | ICD-10-CM | POA: Diagnosis not present

## 2017-01-29 NOTE — Anesthesia Preprocedure Evaluation (Addendum)
Anesthesia Evaluation  Patient identified by MRN, date of birth, ID band Patient awake    Reviewed: Allergy & Precautions, H&P , NPO status , Patient's Chart, lab work & pertinent test results  Airway Mallampati: II  TM Distance: >3 FB Neck ROM: Full    Dental no notable dental hx. (+) Teeth Intact, Dental Advisory Given   Pulmonary sleep apnea and Continuous Positive Airway Pressure Ventilation , COPD,  COPD inhaler,    Pulmonary exam normal breath sounds clear to auscultation       Cardiovascular Exercise Tolerance: Good + CAD, + Past MI and + Cardiac Stents   Rhythm:Regular Rate:Normal     Neuro/Psych PTSDnegative neurological ROS  negative psych ROS   GI/Hepatic Neg liver ROS, GERD  Medicated and Controlled,  Endo/Other  Hypothyroidism   Renal/GU negative Renal ROS  negative genitourinary   Musculoskeletal  (+) Arthritis , Osteoarthritis,    Abdominal   Peds  Hematology negative hematology ROS (+) anemia ,   Anesthesia Other Findings   Reproductive/Obstetrics negative OB ROS                            Anesthesia Physical Anesthesia Plan  ASA: III  Anesthesia Plan: General   Post-op Pain Management:    Induction: Intravenous  Airway Management Planned: Oral ETT  Additional Equipment: Arterial line  Intra-op Plan:   Post-operative Plan: Extubation in OR  Informed Consent: I have reviewed the patients History and Physical, chart, labs and discussed the procedure including the risks, benefits and alternatives for the proposed anesthesia with the patient or authorized representative who has indicated his/her understanding and acceptance.   Dental advisory given  Plan Discussed with: CRNA  Anesthesia Plan Comments:         Anesthesia Quick Evaluation

## 2017-01-30 ENCOUNTER — Inpatient Hospital Stay (HOSPITAL_COMMUNITY): Payer: Medicare Other | Admitting: Certified Registered Nurse Anesthetist

## 2017-01-30 ENCOUNTER — Encounter (HOSPITAL_COMMUNITY)
Admission: RE | Disposition: A | Discharge status: Home / Self Care | Payer: Self-pay | Source: Ambulatory Visit | Attending: Orthopedic Surgery

## 2017-01-30 ENCOUNTER — Inpatient Hospital Stay (HOSPITAL_COMMUNITY): Payer: Medicare Other

## 2017-01-30 ENCOUNTER — Encounter (HOSPITAL_COMMUNITY): Payer: Self-pay | Admitting: Certified Registered Nurse Anesthetist

## 2017-01-30 ENCOUNTER — Inpatient Hospital Stay (HOSPITAL_COMMUNITY)
Admission: RE | Admit: 2017-01-30 | Discharge: 2017-01-31 | DRG: 455 | Disposition: A | Discharge status: Home / Self Care | Payer: Medicare Other | Source: Ambulatory Visit | Attending: Orthopedic Surgery | Admitting: Orthopedic Surgery

## 2017-01-30 DIAGNOSIS — I2510 Atherosclerotic heart disease of native coronary artery without angina pectoris: Secondary | ICD-10-CM | POA: Diagnosis present

## 2017-01-30 DIAGNOSIS — J439 Emphysema, unspecified: Secondary | ICD-10-CM | POA: Diagnosis present

## 2017-01-30 DIAGNOSIS — Z981 Arthrodesis status: Secondary | ICD-10-CM | POA: Diagnosis not present

## 2017-01-30 DIAGNOSIS — Z419 Encounter for procedure for purposes other than remedying health state, unspecified: Secondary | ICD-10-CM

## 2017-01-30 DIAGNOSIS — M545 Low back pain: Secondary | ICD-10-CM | POA: Diagnosis not present

## 2017-01-30 DIAGNOSIS — M79661 Pain in right lower leg: Secondary | ICD-10-CM | POA: Diagnosis not present

## 2017-01-30 DIAGNOSIS — M79605 Pain in left leg: Secondary | ICD-10-CM | POA: Diagnosis not present

## 2017-01-30 DIAGNOSIS — M4807 Spinal stenosis, lumbosacral region: Secondary | ICD-10-CM | POA: Diagnosis present

## 2017-01-30 DIAGNOSIS — F4310 Post-traumatic stress disorder, unspecified: Secondary | ICD-10-CM | POA: Diagnosis present

## 2017-01-30 DIAGNOSIS — M79604 Pain in right leg: Secondary | ICD-10-CM | POA: Diagnosis not present

## 2017-01-30 DIAGNOSIS — M4317 Spondylolisthesis, lumbosacral region: Secondary | ICD-10-CM | POA: Diagnosis present

## 2017-01-30 DIAGNOSIS — K219 Gastro-esophageal reflux disease without esophagitis: Secondary | ICD-10-CM | POA: Diagnosis present

## 2017-01-30 DIAGNOSIS — E039 Hypothyroidism, unspecified: Secondary | ICD-10-CM | POA: Diagnosis present

## 2017-01-30 DIAGNOSIS — Z85118 Personal history of other malignant neoplasm of bronchus and lung: Secondary | ICD-10-CM | POA: Diagnosis not present

## 2017-01-30 DIAGNOSIS — M5117 Intervertebral disc disorders with radiculopathy, lumbosacral region: Principal | ICD-10-CM | POA: Diagnosis present

## 2017-01-30 DIAGNOSIS — I252 Old myocardial infarction: Secondary | ICD-10-CM | POA: Diagnosis not present

## 2017-01-30 DIAGNOSIS — Z955 Presence of coronary angioplasty implant and graft: Secondary | ICD-10-CM

## 2017-01-30 DIAGNOSIS — E785 Hyperlipidemia, unspecified: Secondary | ICD-10-CM | POA: Diagnosis not present

## 2017-01-30 DIAGNOSIS — Z8546 Personal history of malignant neoplasm of prostate: Secondary | ICD-10-CM

## 2017-01-30 DIAGNOSIS — Z79899 Other long term (current) drug therapy: Secondary | ICD-10-CM | POA: Diagnosis not present

## 2017-01-30 DIAGNOSIS — M4306 Spondylolysis, lumbar region: Secondary | ICD-10-CM | POA: Diagnosis not present

## 2017-01-30 DIAGNOSIS — M5410 Radiculopathy, site unspecified: Secondary | ICD-10-CM | POA: Diagnosis present

## 2017-01-30 DIAGNOSIS — M5137 Other intervertebral disc degeneration, lumbosacral region: Secondary | ICD-10-CM | POA: Diagnosis not present

## 2017-01-30 DIAGNOSIS — N200 Calculus of kidney: Secondary | ICD-10-CM | POA: Diagnosis not present

## 2017-01-30 DIAGNOSIS — G4730 Sleep apnea, unspecified: Secondary | ICD-10-CM | POA: Diagnosis present

## 2017-01-30 DIAGNOSIS — M79662 Pain in left lower leg: Secondary | ICD-10-CM | POA: Diagnosis not present

## 2017-01-30 HISTORY — PX: ABDOMINAL EXPOSURE: SHX5708

## 2017-01-30 HISTORY — PX: ANTERIOR LUMBAR FUSION: SHX1170

## 2017-01-30 LAB — TYPE AND SCREEN
ABO/RH(D): O POS
Antibody Screen: NEGATIVE

## 2017-01-30 SURGERY — ANTERIOR LUMBAR FUSION 1 LEVEL
Anesthesia: General

## 2017-01-30 MED ORDER — POLYVINYL ALCOHOL 1.4 % OP SOLN
1.0000 [drp] | OPHTHALMIC | Status: DC | PRN
  Filled 2017-01-30: qty 15

## 2017-01-30 MED ORDER — FENTANYL CITRATE (PF) 100 MCG/2ML IJ SOLN
INTRAMUSCULAR | Status: AC
  Filled 2017-01-30: qty 4

## 2017-01-30 MED ORDER — CEFAZOLIN SODIUM-DEXTROSE 2-4 GM/100ML-% IV SOLN
2.0000 g | Freq: Three times a day (TID) | INTRAVENOUS | Status: AC
  Administered 2017-01-30 (×2): 2 g via INTRAVENOUS
  Filled 2017-01-30: qty 100

## 2017-01-30 MED ORDER — FENTANYL CITRATE (PF) 100 MCG/2ML IJ SOLN
INTRAMUSCULAR | Status: DC | PRN
  Administered 2017-01-30: 25 ug via INTRAVENOUS
  Administered 2017-01-30: 75 ug via INTRAVENOUS
  Administered 2017-01-30 (×2): 25 ug via INTRAVENOUS
  Administered 2017-01-30: 50 ug via INTRAVENOUS

## 2017-01-30 MED ORDER — PHENYLEPHRINE 40 MCG/ML (10ML) SYRINGE FOR IV PUSH (FOR BLOOD PRESSURE SUPPORT)
PREFILLED_SYRINGE | INTRAVENOUS | Status: AC
  Filled 2017-01-30: qty 10

## 2017-01-30 MED ORDER — LIDOCAINE HCL (CARDIAC) 20 MG/ML IV SOLN
INTRAVENOUS | Status: DC | PRN
  Administered 2017-01-30: 60 mg via INTRATRACHEAL

## 2017-01-30 MED ORDER — BISACODYL 5 MG PO TBEC: 5.0000 mg | DELAYED_RELEASE_TABLET | Freq: Every day | ORAL | Status: DC | PRN

## 2017-01-30 MED ORDER — DIAZEPAM 5 MG PO TABS
5.0000 mg | ORAL_TABLET | Freq: Four times a day (QID) | ORAL | Status: DC | PRN
  Administered 2017-01-30: 5 mg via ORAL
  Filled 2017-01-30: qty 1

## 2017-01-30 MED ORDER — THROMBIN 20000 UNITS EX SOLR
CUTANEOUS | Status: AC
  Filled 2017-01-30: qty 40000

## 2017-01-30 MED ORDER — OXYCODONE HCL 5 MG PO TABS
5.0000 mg | ORAL_TABLET | ORAL | Status: DC | PRN
  Administered 2017-01-30 – 2017-01-31 (×3): 5 mg via ORAL
  Filled 2017-01-30 (×3): qty 1

## 2017-01-30 MED ORDER — PROPOFOL 500 MG/50ML IV EMUL
INTRAVENOUS | Status: DC | PRN
  Administered 2017-01-30: 12:00:00 via INTRAVENOUS
  Administered 2017-01-30: 50 ug/kg/min via INTRAVENOUS

## 2017-01-30 MED ORDER — ARTIFICIAL TEARS OP OINT
TOPICAL_OINTMENT | OPHTHALMIC | Status: DC | PRN
  Administered 2017-01-30: 1 via OPHTHALMIC

## 2017-01-30 MED ORDER — SODIUM CHLORIDE 0.9 % IV SOLN
0.0500 ug/kg/min | INTRAVENOUS | Status: AC
  Administered 2017-01-30: .1 ug/kg/min via INTRAVENOUS
  Filled 2017-01-30: qty 5000

## 2017-01-30 MED ORDER — EPINEPHRINE PF 1 MG/ML IJ SOLN
INTRAMUSCULAR | Status: AC
  Filled 2017-01-30: qty 1

## 2017-01-30 MED ORDER — CEFAZOLIN SODIUM-DEXTROSE 2-4 GM/100ML-% IV SOLN
2.0000 g | INTRAVENOUS | Status: AC
  Administered 2017-01-30: 2 g via INTRAVENOUS
  Filled 2017-01-30: qty 100

## 2017-01-30 MED ORDER — NITROGLYCERIN 0.4 MG SL SUBL: 0.4000 mg | SUBLINGUAL_TABLET | SUBLINGUAL | Status: DC | PRN

## 2017-01-30 MED ORDER — SODIUM CHLORIDE 0.9% FLUSH: 3.0000 mL | INTRAVENOUS | Status: DC | PRN

## 2017-01-30 MED ORDER — PROPOFOL 1000 MG/100ML IV EMUL
INTRAVENOUS | Status: AC
  Filled 2017-01-30: qty 400

## 2017-01-30 MED ORDER — POTASSIUM CHLORIDE IN NACL 20-0.9 MEQ/L-% IV SOLN: INTRAVENOUS | Status: DC

## 2017-01-30 MED ORDER — PHENYLEPHRINE HCL 10 MG/ML IJ SOLN
INTRAMUSCULAR | Status: DC | PRN
  Administered 2017-01-30 (×2): 40 ug via INTRAVENOUS
  Administered 2017-01-30 (×2): 80 ug via INTRAVENOUS
  Administered 2017-01-30: 120 ug via INTRAVENOUS
  Administered 2017-01-30: 40 ug via INTRAVENOUS

## 2017-01-30 MED ORDER — CARBOXYMETHYLCELLULOSE SODIUM 1 % OP SOLN: 1.0000 [drp] | Freq: Two times a day (BID) | OPHTHALMIC | Status: DC

## 2017-01-30 MED ORDER — SENNOSIDES-DOCUSATE SODIUM 8.6-50 MG PO TABS: 1.0000 | ORAL_TABLET | Freq: Every evening | ORAL | Status: DC | PRN

## 2017-01-30 MED ORDER — ACETAMINOPHEN 325 MG PO TABS
650.0000 mg | ORAL_TABLET | ORAL | Status: DC | PRN
  Administered 2017-01-31 (×2): 650 mg via ORAL
  Filled 2017-01-30 (×2): qty 2

## 2017-01-30 MED ORDER — ROCURONIUM BROMIDE 50 MG/5ML IV SOSY
PREFILLED_SYRINGE | INTRAVENOUS | Status: AC
  Filled 2017-01-30: qty 5

## 2017-01-30 MED ORDER — SODIUM CHLORIDE 0.9% FLUSH: 3.0000 mL | Freq: Two times a day (BID) | INTRAVENOUS | Status: DC

## 2017-01-30 MED ORDER — SUGAMMADEX SODIUM 200 MG/2ML IV SOLN
INTRAVENOUS | Status: DC | PRN
  Administered 2017-01-30: 200 mg via INTRAVENOUS

## 2017-01-30 MED ORDER — GABAPENTIN 400 MG PO CAPS
1200.0000 mg | ORAL_CAPSULE | Freq: Every day | ORAL | Status: DC
  Administered 2017-01-30: 1200 mg via ORAL
  Filled 2017-01-30: qty 3

## 2017-01-30 MED ORDER — ZOLPIDEM TARTRATE 5 MG PO TABS: 5.0000 mg | ORAL_TABLET | Freq: Every evening | ORAL | Status: DC | PRN

## 2017-01-30 MED ORDER — ONDANSETRON HCL 4 MG/2ML IJ SOLN
INTRAMUSCULAR | Status: AC
  Filled 2017-01-30: qty 2

## 2017-01-30 MED ORDER — 0.9 % SODIUM CHLORIDE (POUR BTL) OPTIME
TOPICAL | Status: DC | PRN
  Administered 2017-01-30 (×2): 1000 mL

## 2017-01-30 MED ORDER — LEVOTHYROXINE SODIUM 100 MCG PO TABS
50.0000 ug | ORAL_TABLET | Freq: Every day | ORAL | Status: DC
  Administered 2017-01-31: 50 ug via ORAL
  Filled 2017-01-30: qty 1

## 2017-01-30 MED ORDER — MORPHINE SULFATE (PF) 4 MG/ML IV SOLN: 1.0000 mg | INTRAVENOUS | Status: DC | PRN

## 2017-01-30 MED ORDER — ALUM & MAG HYDROXIDE-SIMETH 200-200-20 MG/5ML PO SUSP: 30.0000 mL | Freq: Four times a day (QID) | ORAL | Status: DC | PRN

## 2017-01-30 MED ORDER — CARBOXYMETHYLCELLULOSE SODIUM 1 % OP GEL: 1.0000 [drp] | Freq: Every day | OPHTHALMIC | Status: DC

## 2017-01-30 MED ORDER — SURGIFOAM 100 EX MISC
CUTANEOUS | Status: DC | PRN
  Administered 2017-01-30: 20 mL via TOPICAL

## 2017-01-30 MED ORDER — PROPOFOL 10 MG/ML IV BOLUS
INTRAVENOUS | Status: DC | PRN
  Administered 2017-01-30 (×2): 100 mg via INTRAVENOUS

## 2017-01-30 MED ORDER — ONDANSETRON HCL 4 MG/2ML IJ SOLN: 4.0000 mg | Freq: Four times a day (QID) | INTRAMUSCULAR | Status: DC | PRN

## 2017-01-30 MED ORDER — LACTATED RINGERS IV SOLN
INTRAVENOUS | Status: DC | PRN
  Administered 2017-01-30: 08:00:00 via INTRAVENOUS

## 2017-01-30 MED ORDER — DOCUSATE SODIUM 100 MG PO CAPS
100.0000 mg | ORAL_CAPSULE | Freq: Two times a day (BID) | ORAL | Status: DC
  Administered 2017-01-30: 100 mg via ORAL
  Filled 2017-01-30: qty 1

## 2017-01-30 MED ORDER — BUPIVACAINE-EPINEPHRINE 0.25% -1:200000 IJ SOLN
INTRAMUSCULAR | Status: DC | PRN
  Administered 2017-01-30: 20 mL
  Administered 2017-01-30: 5 mL

## 2017-01-30 MED ORDER — ONDANSETRON HCL 4 MG/2ML IJ SOLN
INTRAMUSCULAR | Status: DC | PRN
  Administered 2017-01-30 (×2): 4 mg via INTRAVENOUS

## 2017-01-30 MED ORDER — ONDANSETRON HCL 4 MG PO TABS: 4.0000 mg | ORAL_TABLET | Freq: Four times a day (QID) | ORAL | Status: DC | PRN

## 2017-01-30 MED ORDER — ALBUMIN HUMAN 5 % IV SOLN
INTRAVENOUS | Status: DC | PRN
  Administered 2017-01-30: 10:00:00 via INTRAVENOUS

## 2017-01-30 MED ORDER — GLYCOPYRROLATE 0.2 MG/ML IJ SOLN
INTRAMUSCULAR | Status: DC | PRN
  Administered 2017-01-30 (×2): 0.2 mg via INTRAVENOUS

## 2017-01-30 MED ORDER — LIDOCAINE 2% (20 MG/ML) 5 ML SYRINGE
INTRAMUSCULAR | Status: AC
  Filled 2017-01-30: qty 5

## 2017-01-30 MED ORDER — PHENYLEPHRINE HCL 10 MG/ML IJ SOLN
INTRAVENOUS | Status: DC | PRN
  Administered 2017-01-30: 25 ug/min via INTRAVENOUS

## 2017-01-30 MED ORDER — BUPIVACAINE LIPOSOME 1.3 % IJ SUSP
20.0000 mL | INTRAMUSCULAR | Status: AC
  Administered 2017-01-30: 20 mL
  Filled 2017-01-30: qty 20

## 2017-01-30 MED ORDER — HYDROMORPHONE HCL 1 MG/ML IJ SOLN
INTRAMUSCULAR | Status: AC
  Filled 2017-01-30: qty 1

## 2017-01-30 MED ORDER — PROPOFOL 10 MG/ML IV BOLUS
INTRAVENOUS | Status: AC
  Filled 2017-01-30: qty 20

## 2017-01-30 MED ORDER — FERROUS SULFATE 325 (65 FE) MG PO TABS: 325.0000 mg | ORAL_TABLET | ORAL | Status: DC

## 2017-01-30 MED ORDER — ALBUTEROL SULFATE (2.5 MG/3ML) 0.083% IN NEBU: 2.5000 mg | INHALATION_SOLUTION | Freq: Four times a day (QID) | RESPIRATORY_TRACT | Status: DC | PRN

## 2017-01-30 MED ORDER — ALBUTEROL SULFATE HFA 108 (90 BASE) MCG/ACT IN AERS
INHALATION_SPRAY | RESPIRATORY_TRACT | Status: DC | PRN
  Administered 2017-01-30 (×2): 2 via RESPIRATORY_TRACT

## 2017-01-30 MED ORDER — FLEET ENEMA 7-19 GM/118ML RE ENEM: 1.0000 | ENEMA | Freq: Once | RECTAL | Status: DC | PRN

## 2017-01-30 MED ORDER — MENTHOL 3 MG MT LOZG: 1.0000 | LOZENGE | OROMUCOSAL | Status: DC | PRN

## 2017-01-30 MED ORDER — PANTOPRAZOLE SODIUM 40 MG PO TBEC: 40.0000 mg | DELAYED_RELEASE_TABLET | Freq: Every day | ORAL | Status: DC

## 2017-01-30 MED ORDER — BUPIVACAINE HCL (PF) 0.25 % IJ SOLN
INTRAMUSCULAR | Status: AC
  Filled 2017-01-30: qty 30

## 2017-01-30 MED ORDER — SODIUM CHLORIDE 0.9 % IV SOLN
INTRAVENOUS | Status: DC | PRN
  Administered 2017-01-30 (×2): via INTRAVENOUS

## 2017-01-30 MED ORDER — ALBUTEROL SULFATE HFA 108 (90 BASE) MCG/ACT IN AERS
INHALATION_SPRAY | RESPIRATORY_TRACT | Status: AC
  Filled 2017-01-30: qty 6.7

## 2017-01-30 MED ORDER — HYDROMORPHONE HCL 1 MG/ML IJ SOLN
0.2500 mg | INTRAMUSCULAR | Status: DC | PRN
  Administered 2017-01-30 (×2): 0.5 mg via INTRAVENOUS

## 2017-01-30 MED ORDER — PHENOL 1.4 % MT LIQD: 1.0000 | OROMUCOSAL | Status: DC | PRN

## 2017-01-30 MED ORDER — ARTIFICIAL TEARS OP OINT
TOPICAL_OINTMENT | OPHTHALMIC | Status: AC
  Filled 2017-01-30: qty 3.5

## 2017-01-30 MED ORDER — POVIDONE-IODINE 7.5 % EX SOLN
Freq: Once | CUTANEOUS | Status: DC
  Filled 2017-01-30: qty 118

## 2017-01-30 MED ORDER — CHLORHEXIDINE GLUCONATE 4 % EX LIQD: 60.0000 mL | Freq: Once | CUTANEOUS | Status: DC

## 2017-01-30 MED ORDER — ROCURONIUM BROMIDE 100 MG/10ML IV SOLN
INTRAVENOUS | Status: DC | PRN
  Administered 2017-01-30: 50 mg via INTRAVENOUS
  Administered 2017-01-30: 25 mg via INTRAVENOUS

## 2017-01-30 MED ORDER — ACETAMINOPHEN 650 MG RE SUPP: 650.0000 mg | RECTAL | Status: DC | PRN

## 2017-01-30 MED ORDER — ROSUVASTATIN CALCIUM 5 MG PO TABS
10.0000 mg | ORAL_TABLET | Freq: Every day | ORAL | Status: DC
  Administered 2017-01-30: 10 mg via ORAL
  Filled 2017-01-30: qty 2

## 2017-01-30 MED FILL — Heparin Sodium (Porcine) Inj 1000 Unit/ML: INTRAMUSCULAR | Qty: 30 | Status: AC

## 2017-01-30 MED FILL — Sodium Chloride IV Soln 0.9%: INTRAVENOUS | Qty: 1000 | Status: AC

## 2017-01-30 SURGICAL SUPPLY — 122 items
BENZOIN TINCTURE PRP APPL 2/3 (GAUZE/BANDAGES/DRESSINGS) ×12 IMPLANT
BLADE CLIPPER SURG (BLADE) ×8 IMPLANT
BLADE SURG 10 STRL SS (BLADE) ×12 IMPLANT
BONE VIVIGEN FORMABLE 10CC (Bone Implant) ×4 IMPLANT
BUR PRESCISION 1.7 ELITE (BURR) IMPLANT
BUR ROUND PRECISION 4.0 (BURR) IMPLANT
BUR ROUND PRECISION 4.0MM (BURR)
BUR SABER RD CUTTING 3.0 (BURR) IMPLANT
BUR SABER RD CUTTING 3.0MM (BURR)
CAGE COUGAR 16MM 15 DEG MEDIUM (Cage) ×1 IMPLANT
CAGE COUGAR MED 16 15D (Cage) ×3 IMPLANT
CARTRIDGE OIL MAESTRO DRILL (MISCELLANEOUS) IMPLANT
CLIP LIGATING EXTRA MED SLVR (CLIP) IMPLANT
CLIP LIGATING EXTRA SM BLUE (MISCELLANEOUS) ×4 IMPLANT
CLOSURE WOUND 1/2 X4 (GAUZE/BANDAGES/DRESSINGS) ×2
CONT SPEC STER OR (MISCELLANEOUS) ×4 IMPLANT
COVER BACK TABLE 80X110 HD (DRAPES) ×4 IMPLANT
COVER MAYO STAND STRL (DRAPES) ×8 IMPLANT
COVER SURGICAL LIGHT HANDLE (MISCELLANEOUS) ×8 IMPLANT
DERMABOND ADVANCED (GAUZE/BANDAGES/DRESSINGS) ×2
DERMABOND ADVANCED .7 DNX12 (GAUZE/BANDAGES/DRESSINGS) ×2 IMPLANT
DIFFUSER DRILL AIR PNEUMATIC (MISCELLANEOUS) IMPLANT
DRAIN CHANNEL 15F RND FF W/TCR (WOUND CARE) IMPLANT
DRAPE C-ARM 42X72 X-RAY (DRAPES) ×8 IMPLANT
DRAPE C-ARMOR (DRAPES) IMPLANT
DRAPE POUCH INSTRU U-SHP 10X18 (DRAPES) ×8 IMPLANT
DRAPE SURG 17X23 STRL (DRAPES) ×32 IMPLANT
DRSG MEPILEX BORDER 4X8 (GAUZE/BANDAGES/DRESSINGS) ×4 IMPLANT
DURAPREP 26ML APPLICATOR (WOUND CARE) ×8 IMPLANT
ELECT BLADE 4.0 EZ CLEAN MEGAD (MISCELLANEOUS) ×8
ELECT BLADE 6.5 EXT (BLADE) ×4 IMPLANT
ELECT CAUTERY BLADE 6.4 (BLADE) ×4 IMPLANT
ELECT REM PT RETURN 9FT ADLT (ELECTROSURGICAL) ×8
ELECTRODE BLDE 4.0 EZ CLN MEGD (MISCELLANEOUS) ×4 IMPLANT
ELECTRODE REM PT RTRN 9FT ADLT (ELECTROSURGICAL) ×4 IMPLANT
EVACUATOR SILICONE 100CC (DRAIN) IMPLANT
FEE INTRAOP MONITOR IMPULS NCS (MISCELLANEOUS) ×2 IMPLANT
GAUZE SPONGE 4X4 12PLY STRL (GAUZE/BANDAGES/DRESSINGS) ×8 IMPLANT
GAUZE SPONGE 4X4 16PLY XRAY LF (GAUZE/BANDAGES/DRESSINGS) ×4 IMPLANT
GLOVE BIO SURGEON STRL SZ7 (GLOVE) ×8 IMPLANT
GLOVE BIO SURGEON STRL SZ8 (GLOVE) ×12 IMPLANT
GLOVE BIOGEL PI IND STRL 7.0 (GLOVE) ×4 IMPLANT
GLOVE BIOGEL PI IND STRL 8 (GLOVE) ×4 IMPLANT
GLOVE BIOGEL PI INDICATOR 7.0 (GLOVE) ×4
GLOVE BIOGEL PI INDICATOR 8 (GLOVE) ×4
GLOVE SS BIOGEL STRL SZ 7.5 (GLOVE) ×2 IMPLANT
GLOVE SUPERSENSE BIOGEL SZ 7.5 (GLOVE) ×2
GOWN STRL REUS W/ TWL LRG LVL3 (GOWN DISPOSABLE) ×14 IMPLANT
GOWN STRL REUS W/ TWL XL LVL3 (GOWN DISPOSABLE) ×4 IMPLANT
GOWN STRL REUS W/TWL LRG LVL3 (GOWN DISPOSABLE) ×14
GOWN STRL REUS W/TWL XL LVL3 (GOWN DISPOSABLE) ×4
GUIDEWIRE BLUNT VIPER II 1.45 (WIRE) ×4 IMPLANT
GUIDEWIRE SHARP VIPER II (WIRE) ×16 IMPLANT
GUIDEWIRE VIPER BLUNT 1.45DIA (WIRE) ×3 IMPLANT
INSERT FOGARTY 61MM (MISCELLANEOUS) IMPLANT
INSERT FOGARTY SM (MISCELLANEOUS) IMPLANT
INTRAOP MONITOR FEE IMPULS NCS (MISCELLANEOUS) ×2
INTRAOP MONITOR FEE IMPULSE (MISCELLANEOUS) ×2
IV CATH 14GX2 1/4 (CATHETERS) ×4 IMPLANT
KIT BASIN OR (CUSTOM PROCEDURE TRAY) ×4 IMPLANT
KIT POSITION SURG JACKSON T1 (MISCELLANEOUS) ×4 IMPLANT
KIT ROOM TURNOVER OR (KITS) ×4 IMPLANT
LOOP VESSEL MAXI BLUE (MISCELLANEOUS) IMPLANT
LOOP VESSEL MINI RED (MISCELLANEOUS) IMPLANT
MARKER SKIN DUAL TIP RULER LAB (MISCELLANEOUS) ×12 IMPLANT
NDL SAFETY ECLIPSE 18X1.5 (NEEDLE) ×2 IMPLANT
NEEDLE 22X1 1/2 (OR ONLY) (NEEDLE) ×4 IMPLANT
NEEDLE HYPO 18GX1.5 SHARP (NEEDLE) ×2
NEEDLE HYPO 25GX1X1/2 BEV (NEEDLE) ×4 IMPLANT
NEEDLE JAMSHIDI VIPER (NEEDLE) ×8 IMPLANT
NEEDLE SPNL 18GX3.5 QUINCKE PK (NEEDLE) ×8 IMPLANT
NS IRRIG 1000ML POUR BTL (IV SOLUTION) ×8 IMPLANT
OIL CARTRIDGE MAESTRO DRILL (MISCELLANEOUS)
PACK LAMINECTOMY ORTHO (CUSTOM PROCEDURE TRAY) ×4 IMPLANT
PACK UNIVERSAL I (CUSTOM PROCEDURE TRAY) ×8 IMPLANT
PAD ARMBOARD 7.5X6 YLW CONV (MISCELLANEOUS) ×16 IMPLANT
PATTIES SURGICAL .5 X1 (DISPOSABLE) ×4 IMPLANT
PATTIES SURGICAL .5X1.5 (GAUZE/BANDAGES/DRESSINGS) ×4 IMPLANT
PROBE PEDCLE PROBE MAGSTM DISP (MISCELLANEOUS) ×4 IMPLANT
ROD VIPER II LORDOSED 5.5X35 (Rod) ×4 IMPLANT
ROD VIPER II LORDOSED 5.5X40 (Rod) ×4 IMPLANT
SCREW CANCELLOUS 6.5 32MM (Screw) ×4 IMPLANT
SCREW SET SINGLE INNER MIS (Screw) ×16 IMPLANT
SCREW VIPER II XTAB POLY 7X45M (Screw) ×8 IMPLANT
SCREW VIPER X-TAB 6.0X40 (Screw) ×8 IMPLANT
SPONGE INTESTINAL PEANUT (DISPOSABLE) ×16 IMPLANT
SPONGE LAP 18X18 X RAY DECT (DISPOSABLE) IMPLANT
SPONGE LAP 4X18 X RAY DECT (DISPOSABLE) ×4 IMPLANT
SPONGE SURGIFOAM ABS GEL 100 (HEMOSTASIS) ×4 IMPLANT
STAPLER VISISTAT 35W (STAPLE) ×8 IMPLANT
STRIP CLOSURE SKIN 1/2X4 (GAUZE/BANDAGES/DRESSINGS) ×6 IMPLANT
SURGIFLO W/THROMBIN 8M KIT (HEMOSTASIS) IMPLANT
SUT MNCRL AB 4-0 PS2 18 (SUTURE) ×8 IMPLANT
SUT PROLENE 4 0 RB 1 (SUTURE)
SUT PROLENE 4-0 RB1 .5 CRCL 36 (SUTURE) IMPLANT
SUT PROLENE 5 0 CC1 (SUTURE) ×12 IMPLANT
SUT PROLENE 6 0 C 1 30 (SUTURE) ×4 IMPLANT
SUT PROLENE 6 0 CC (SUTURE) IMPLANT
SUT SILK 0 TIES 10X30 (SUTURE) ×4 IMPLANT
SUT SILK 2 0 TIES 10X30 (SUTURE) IMPLANT
SUT SILK 2 0SH CR/8 30 (SUTURE) IMPLANT
SUT SILK 3 0 TIES 10X30 (SUTURE) ×4 IMPLANT
SUT SILK 3 0SH CR/8 30 (SUTURE) IMPLANT
SUT VIC AB 0 CT1 18XCR BRD 8 (SUTURE) ×2 IMPLANT
SUT VIC AB 0 CT1 8-18 (SUTURE) ×2
SUT VIC AB 1 CT1 18XCR BRD 8 (SUTURE) ×4 IMPLANT
SUT VIC AB 1 CT1 8-18 (SUTURE) ×4
SUT VIC AB 2-0 CT2 18 VCP726D (SUTURE) ×8 IMPLANT
SYR 20CC LL (SYRINGE) ×4 IMPLANT
SYR BULB IRRIGATION 50ML (SYRINGE) ×4 IMPLANT
SYR CONTROL 10ML LL (SYRINGE) ×8 IMPLANT
SYR TB 1ML LUER SLIP (SYRINGE) ×4 IMPLANT
TAP CANN VIPER2 DL 5.0 (TAP) ×8 IMPLANT
TAP CANN VIPER2 DL 6.0 (TAP) ×8 IMPLANT
TAPE CLOTH SURG 4X10 WHT LF (GAUZE/BANDAGES/DRESSINGS) ×4 IMPLANT
TOWEL OR 17X24 6PK STRL BLUE (TOWEL DISPOSABLE) ×4 IMPLANT
TOWEL OR 17X26 10 PK STRL BLUE (TOWEL DISPOSABLE) ×4 IMPLANT
TRAY FOLEY CATH 16FR SILVER (SET/KITS/TRAYS/PACK) ×4 IMPLANT
TRAY FOLEY W/METER SILVER 16FR (SET/KITS/TRAYS/PACK) ×4 IMPLANT
WASHER 13.0MM (Orthopedic Implant) ×4 IMPLANT
WATER STERILE IRR 1000ML POUR (IV SOLUTION) ×4 IMPLANT
YANKAUER SUCT BULB TIP NO VENT (SUCTIONS) ×4 IMPLANT

## 2017-01-30 NOTE — Anesthesia Procedure Notes (Signed)
Arterial Line Insertion Start/End3/14/2018 8:00 AM, 01/30/2017 8:30 AM Performed by: Dellie Catholic S  Patient location: Pre-op. Preanesthetic checklist: patient identified, IV checked, risks and benefits discussed, monitors and equipment checked and timeout performed Lidocaine 1% used for infiltration Catheter size: 20 Fr Hand hygiene performed  and maximum sterile barriers used   Attempts: 2 Procedure performed without using ultrasound guided technique. Following insertion, line sutured, dressing applied and Biopatch. Post procedure assessment: normal  Patient tolerated the procedure well with no immediate complications.

## 2017-01-30 NOTE — Op Note (Signed)
    OPERATIVE REPORT  DATE OF SURGERY: 01/30/2017  PATIENT: Gregory Scott, 76 y.o. male MRN: [id number]  DOB: 07-05-1941  PRE-OPERATIVE DIAGNOSIS: Degenerative disc disease  POST-OPERATIVE DIAGNOSIS:  Same  PROCEDURE: Anterior exposure for L5-S1 disc surgery  SURGEON:  Curt Jews, M.D.  Co-surgeon for the exposure Dr. Phylliss Bob:   Total I/O In: 4356 [I.V.:3200; IV Piggyback:500] Out: 1250 [Urine:1000; Blood:250]  BLOOD ADMINISTERED:  none     COUNTS CORRECT:  YES  PLAN OF PACU STABLE   PATIENT DISPOSITION:  PACU - hemodynamically stable  PROCEDURE DETAIthe patient was taken to the operating placed supine position where the area of the abdomen was prepped and draped in the usual sterile fashion C-arm above the field term the level of the L5-S1 disc. This was low in the pelvis the patient at that severe angulation of the L5-S1 disc. Incision was made just above the pubic bone in the midline extending transversely to the left. This was taken down to the subcutaneous fat with electrocautery and the anterior rectus sheath was opened in line with the incision with electrocautery. The rectus muscle was mobilized circumferentially. The risks peritoneal space was entered bluntly and the left lower quadrant and the intraperitoneal contents were mobilized to the right. The posterior rectus sheath was opened laterally and continued mobilization of intraperitoneal contents was accomplished. Left ureter was identified and preserved and was mobilized to the right. The iliac vein on the left was mobilized off the vertebral body. The middle sacral vessels were clipped and divided for continued exposure. Exposure was continued over to the right side of the disc with the blunt dissection. The Thompson retractor was brought onto the field and the 150 reverse lip blade was positioned to the right and left of the disc and the malleable retractor was placed superiorly. C-arm projection revealed  that the exposed level was L4-5. The dissection was continued inferiorly to the L5-S1 level. Blunt dissection was used to give adequate exposure to the right and left side of the L5-S1 disc. Again the 150 reverse lip blades were positioned on either side of the L5-S1 disc. Remaining portion of the procedure were dictated as a separate note with Dr. Levora Dredge, M.D., [facility] 01/30/2017 2:22 PM

## 2017-01-30 NOTE — H&P (Signed)
PREOPERATIVE H&P  Chief Complaint: Low back pain, bilateral leg pain  HPI: Gregory Scott is a 76 y.o. 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 Anemia   . Arthritis    back  . Coronary artery disease   . Emphysema lung ([place])    [facility]  . GERD (gastroesophageal reflux disease)   . History of hiatal hernia   . History of kidney stones    2 lt kidney now  01/22/17  . Hypothyroidism   . Lung cancer ([place])    small part in the right lower   . Prostate cancer ([place])   . PTSD (post-traumatic stress disorder)   . Sleep apnea    sleep study-   [facility]    [place]  [facility]   Past Surgical History:  Procedure Laterality Date  . CARDIAC CATHETERIZATION N/A 09/21/2015   Procedure: Left Heart Cath and Coronary Angiography;  Surgeon: Sherren Mocha, MD;  Location: [place] [facility];  Service: Cardiovascular;  Laterality: N/A;  . CARDIAC CATHETERIZATION Right 09/21/2015   Procedure: Coronary Stent Intervention;  Surgeon: Sherren Mocha, MD;  Location: [place] [facility];  Service: Cardiovascular;  Laterality: Right;  . COLONOSCOPY    . knee surgery    . SHOULDER SURGERY    . TONSILLECTOMY     Social History   Social History  . Marital status: Married    Spouse name: N/A  . Number of children: N/A  . Years of education: N/A   Social History Main Topics  . Smoking status: Never Smoker  . Smokeless tobacco: Never Used  . Alcohol use No  . Drug use: No  . Sexual activity: Not Asked   Other Topics Concern  . None   Social History Narrative   Worked in a [employer]   Married   Nonsmoker   Family History  Problem Relation Age of Onset  . Coronary artery disease Mother 50   Allergies  Allergen Reactions  . Statins Other (See Comments)    MYALGIAS Legs hurt in high doses   Prior to Admission medications   Medication Sig Start Date End Date Taking? Authorizing Provider  acetaminophen-codeine (TYLENOL #3) 300-30 MG tablet Take 1 tablet by mouth 3 (three) times daily as needed for moderate pain. Reported on 11/07/2015 09/19/15  Yes Historical Provider, MD  Carboxymethylcellulose Sodium 1 % GEL Place 1 drop into both eyes at bedtime. Refresh Gel Drop   Yes Historical Provider, MD  ferrous sulfate 325 (65 FE) MG tablet Take 325 mg by mouth every Monday, Wednesday, and Friday. After lunch   Yes Historical Provider, MD  gabapentin (NEURONTIN) 300 MG capsule Take 1,200 mg by mouth at bedtime.  09/20/15  Yes Historical Provider, MD  HYDROcodone-acetaminophen (NORCO/VICODIN) 5-325 MG tablet Take 1 tablet by mouth 2 (two) times daily as needed for moderate pain.   Yes Historical Provider, MD  levothyroxine (SYNTHROID, LEVOTHROID) 50 MCG tablet Take 50 mcg by mouth daily before breakfast.   Yes Historical Provider, MD  nitroGLYCERIN (NITROSTAT) 0.4 MG SL tablet Place  1 tablet (0.4 mg total) under the tongue every 5 (five) minutes x 3 doses as needed for chest pain. 09/23/15  Yes Erma Heritage, PA  omeprazole (PRILOSEC) 20 MG capsule Take 20 mg by mouth See admin instructions. Take 1 capsule (20 MG) twice daily on Sunday, Tuesday, Thursday, & Saturday (with lunch & at bedtime)   Yes Historical Provider, MD  Polyvinyl Alcohol-Povidone (REFRESH OP) Place 1-2 drops into both eyes 4 (four) times daily.   Yes Historical Provider, MD  PROVENTIL HFA 108 (90 BASE) MCG/ACT inhaler Take 1-2 puffs by mouth every 6 (six) hours as needed for wheezing or shortness of breath. Reported on 02/01/2016 09/18/15  Yes Historical Provider, MD  rosuvastatin (CRESTOR) 20 MG tablet Take 10 mg  by mouth at bedtime.   Yes Historical Provider, MD     All other systems have been reviewed and were otherwise negative with the exception of those mentioned in the HPI and as above.  Physical Exam: Vitals:   01/30/17 0701  BP: (!) 156/83  Pulse: (!) 56  Resp: 16  Temp: 98.4 F (36.9 C)    General: Alert, no acute distress Cardiovascular: No pedal edema Respiratory: No cyanosis, no use of accessory musculature Skin: No lesions in the area of chief complaint Neurologic: Sensation intact distally Psychiatric: Patient is competent for consent with normal mood and affect Lymphatic: No axillary or cervical lymphadenopathy   Assessment/Plan: Bilateral leg pain Plan for Procedure(s): LUMBAR 5-SACRAL 1 ANTERIOR INTERBODY FUSION LUMBAR LUMBAR 5-SACRUM 1 POSTERIOR LUMBAR FUSION WITH INSTRUMENTATION AND ALLOGRAFT ABDOMINAL EXPOSURE   Sinclair Ship, MD 01/30/2017 8:11 AM

## 2017-01-30 NOTE — Anesthesia Postprocedure Evaluation (Signed)
Anesthesia Post Note  Patient: Gregory Scott  Procedure(s) Performed: Procedure(s) (LRB): LUMBAR 5-SACRAL 1 ANTERIOR INTERBODY FUSION LUMBAR (Bilateral) LUMBAR 5-SACRUM 1 POSTERIOR LUMBAR FUSION WITH INSTRUMENTATION AND ALLOGRAFT (Bilateral) ABDOMINAL EXPOSURE (N/A)  Patient location during evaluation: PACU Anesthesia Type: General Level of consciousness: awake and alert Pain management: pain level controlled Vital Signs Assessment: post-procedure vital signs reviewed and stable Respiratory status: spontaneous breathing, nonlabored ventilation, respiratory function stable and patient connected to nasal cannula oxygen Cardiovascular status: blood pressure returned to baseline and stable Postop Assessment: no signs of nausea or vomiting Anesthetic complications: no       Last Vitals:  Vitals:   01/30/17 0701 01/30/17 1355  BP: (!) 156/83 137/81  Pulse: (!) 56 (!) 106  Resp: 16 19  Temp: 36.9 C     Last Pain:  Vitals:   01/30/17 1355  PainSc: 5                  Demichael Traum,W. EDMOND

## 2017-01-30 NOTE — Anesthesia Procedure Notes (Signed)
Procedure Name: Intubation Date/Time: 01/30/2017 8:53 AM Performed by: Roderic Palau Pre-anesthesia Checklist: Patient identified, Emergency Drugs available, Suction available and Patient being monitored Patient Re-evaluated:Patient Re-evaluated prior to inductionOxygen Delivery Method: Circle system utilized Preoxygenation: Pre-oxygenation with 100% oxygen Intubation Type: IV induction Ventilation: Oral airway inserted - appropriate to patient size Laryngoscope Size: Mac and 3 Grade View: Grade I Tube type: Oral Tube size: 7.5 mm Number of attempts: 1 Placement Confirmation: ETT inserted through vocal cords under direct vision,  positive ETCO2 and breath sounds checked- equal and bilateral Secured at: 21.5 cm Tube secured with: Tape Dental Injury: Teeth and Oropharynx as per pre-operative assessment

## 2017-01-30 NOTE — Transfer of Care (Signed)
Immediate Anesthesia Transfer of Care Note  Patient: Gregory Scott  Procedure(s) Performed: Procedure(s) with comments: LUMBAR 5-SACRAL 1 ANTERIOR INTERBODY FUSION LUMBAR (Bilateral) - LUMBAR 5-SACRUM 1 ANTERIOR INTERBODY FUSION LUMBAR  LUMBAR 5-SACRUM 1 POSTERIOR LUMBAR FUSION WITH INSTRUMENTATION AND ALLOGRAFT (Bilateral) - LUMBAR 5-SACRUM 1 POSTERIOR LUMBAR FUSION WITH INSTRUMENTATION AND ALLOGRAFT ABDOMINAL EXPOSURE (N/A)  Patient Location: PACU  Anesthesia Type:General  Level of Consciousness: awake, alert  and patient cooperative  Airway & Oxygen Therapy: Patient Spontanous Breathing  Post-op Assessment: Report given to RN, Post -op Vital signs reviewed and stable, Patient moving all extremities X 4 and Patient able to stick tongue midline  Post vital signs: Reviewed and stable  Last Vitals:  Vitals:   01/30/17 0701 01/30/17 1355  BP: (!) 156/83 137/81  Pulse: (!) 56 (!) 106  Resp: 16 19  Temp: 36.9 C     Last Pain:  Vitals:   01/30/17 1355  PainSc: 5       Patients Stated Pain Goal: 3 (30/05/11 0211)  Complications: No apparent anesthesia complications

## 2017-01-31 NOTE — Progress Notes (Signed)
    Patient doing well Patient denies back or leg pain Has been ambulating   Physical Exam: Vitals:   01/30/17 2326 01/31/17 0356  BP: 125/62 (!) 112/52  Pulse: 83 73  Resp: 18 17  Temp: 99.4 F (37.4 C) 99.3 F (37.4 C)    Dressing in place NVI  POD #1 s/p L5/S1 A/P fusion procedure, doing well  - up with PT/OT, encourage ambulation - Percocet for pain, Valium for muscle spasms. Patient may take tylenol as needed as well. - d/c home later today depending on PT progress

## 2017-01-31 NOTE — Progress Notes (Signed)
Patient alert and oriented, mae's well, voiding adequate amount of urine, swallowing without difficulty, no c/o pain at time of discharge. Patient discharged home with family. Script and discharged instructions given to patient. Patient and family stated understanding of instructions given. Patient has an appointment with Dr. Dumonski 

## 2017-01-31 NOTE — Op Note (Signed)
NAMEMEHMET, SCALLY           ACCOUNT NO.:  [id number]  MEDICAL RECORD NO.:  [id number]  LOCATION:                                 FACILITY:  PHYSICIAN:  Phylliss Bob, MD      DATE OF BIRTH:  28-Jun-1941  DATE OF PROCEDURE:  01/30/2017                              OPERATIVE REPORT   PREOPERATIVE DIAGNOSES: 1. L5-S1 spondylolisthesis. 2. Bilateral L5 pars defects. 3. L5-S1 spinal stenosis resulting in bilateral leg pain and low back     pain.  POSTOPERATIVE DIAGNOSES: 1. L5-S1 spondylolisthesis. 2. Bilateral L5 pars defects. 3. L5-S1 spinal stenosis resulting in bilateral leg pain and low back     pain.  PROCEDURE: 1. Anterior lumbar interbody fusion, L5-S1. 2. Insertion of interbody device x1 (16 mm, medium, a 15 degree,     Cougar intervertebral spacer). 3. Use of morselized allograft-Vivigen. 4. Placement of anterior instrumentation, L5, S1. 5. Posterior spinal fusion, L5-S1. 6. Placement of posterior instrumentation L5, S1.  SURGEON:  Phylliss Bob, MD.  ASSISTANTPricilla Holm, PA-C.  ANESTHESIA:  General endotracheal anesthesia.  COMPLICATIONS:  None.  DISPOSITION:  Stable.  ESTIMATED BLOOD LOSS:  100 mL.  INDICATIONS FOR SURGERY:  Briefly, Mr. Heckler is a very pleasant 76- year-old male, who did present to me with ongoing and severe pain in the low back and bilateral legs.  The patient's MRI and x-rays did reveal the findings outlined above.  The patient did fail multiple forms of appropriate conservative care, and did continue to have ongoing pain. We, therefore did discuss proceeding with the procedure reflected above. The patient was fully aware of the risks and limitations of surgery and did elect to proceed.  OPERATIVE DETAILS:  On January 30, 2017, the patient was brought to surgery and general endotracheal anesthesia was administered.  The patient was placed prone on a well-padded flat operating room table. All bony prominences were  padded.  The abdomen was prepped and draped and a time-out procedure was performed.  An anterior retroperitoneal approach was performed by Dr. Curt Jews.  I did function as his coLeisure centre manager during the approach.  Once the L5-S1 intervertebral space was identified, I did proceed with a thorough and complete L5-S1 intervertebral diskectomy.  The endplates of L5 and S1 were prepared and intervertebral trials were placed.  I was very pleased with the 16 mm, 15 degree, medium intervertebral spacer.  The appropriate size intervertebral spacer was then packed with ViviGen and tamped into position in the usual fashion.  A partial reduction of the spondylolisthesis was noted.  I was very pleased with the AP and lateral fluoroscopic images and the resting position of the implant and the press-fit of the implant.  I then proceeded with the anterior instrumentation portion of the procedure.  I did use an awl to cannulate the superior and anterior aspect of the S1 vertebral body.  A screw with an anterior fixation device was advanced across the S1 pedicle to secure the L5-S1 intervertebral space.  I was pleased with the press-fit of the instrumentation.  The wound was then copiously irrigated.  The fascia was then closed using 1 PDS.  The subcutaneous layer was closed using 2-  0 Vicryl, followed by 4-0 Monocryl.  Benzoin and Steri-Strips were applied, followed by sterile dressing.  At this point, the patient was rolled prone onto a Jackson spinal frame. The back was then prepped and draped in the usual sterile fashion.  All bony prominences were padded.  Once again, a time-out procedure was performed.  I then made 2 paramedian incisions overlying the L5 and S1 pedicles bilaterally.  Then, liberally using AP and lateral fluoroscopy, I did cannulate the L5 and S1 pedicles.  I did use a 5 mm tap through the L5 pedicle and a 6 mm tap through the S1 pedicle.  I did use triggered EMG to test each of the  taps and there was no tap that tested below 20 milliamps.  Then on the left side, I did decorticate the left- sided L5-S1 facet joint and posterolateral gutter.  The remainder of the ViviGen was then packed into the posterolateral gutter and along the left posterior elements to help aid in the success of the fusion.  I then advanced a 7 x 35 mm screw bilaterally at S1 and a 6 x 40 mm screw bilaterally at L5.  A 40 mm rod was placed on the left and a 35 mm rod was placed on the right.  Caps were then placed and a final locking procedure was performed.  Of note, it was readily noted that the patient did have rather soft bone with less than adequate purchase with each of the screws.  I did, however, feel that the purchase was satisfactory for the intended goal of stabilizing the L5-S1 intervertebral space to help aid in the fusion success.  At this point, the wound was copiously irrigated.  The fascia was then closed bilaterally using #1 Vicryl.  The subcutaneous layer was closed using 2-0 Vicryl.  The skin was closed using 4-0 Monocryl.  Benzoin and Steri-Strips were applied, followed by sterile dressing.  All instrument counts were correct.  Of note, Pricilla Holm did function as my assistant throughout the surgery, and did aid in retraction suctioning, positioning, and closure.     Phylliss Bob, MD   ______________________________ Phylliss Bob, MD    MD/MEDQ  D:  01/30/2017  T:  01/30/2017  Job:  [id number]

## 2017-01-31 NOTE — Evaluation (Signed)
Physical Therapy Evaluation and Discharge Patient Details Name: Gregory Scott MRN: [id number] DOB: 1941/09/21 Today's Date: 01/31/2017   History of Present Illness  Pt is a 76 y/o male who presents s/p L5-S1 fusion on 01/30/17.   Clinical Impression  Patient evaluated by Physical Therapy with no further acute PT needs identified. All education has been completed and the patient has no further questions. At the time of PT eval pt was able to perform transfers and ambulation with light supervision for safety. Pt was able to don brace with minimal assist to maintain precautions. Pt and wife were educated on walking program, back precautions, and general safety with mobility at home. They have 5 small dogs in the house and educated pt on fall prevention. Wife reports she had tripped over the dogs several times and states "they get under your feet". See below for any follow-up Physical Therapy or equipment needs. PT is signing off. Thank you for this referral.     Follow Up Recommendations Outpatient PT;Supervision for mobility/OOB    Equipment Recommendations  3in1 (PT)    Recommendations for Other Services       Precautions / Restrictions Precautions Precautions: Fall;Back Precaution Booklet Issued: Yes (comment) Precaution Comments: Reviewed with pt and wife. He was cued for precautions during functional mobility.  Required Braces or Orthoses: Spinal Brace Spinal Brace: Thoracolumbosacral orthotic;Applied in sitting position Restrictions Weight Bearing Restrictions: No      Mobility  Bed Mobility               General bed mobility comments: Pt was recieved walking in room without brace donned.   Transfers Overall transfer level: Needs assistance Equipment used: None Transfers: Sit to/from Stand Sit to Stand: Supervision         General transfer comment: Light supervision for safety. Pt able to power-up to full stand without difficulty.    Ambulation/Gait Ambulation/Gait assistance: Supervision Ambulation Distance (Feet): 300 Feet Assistive device: None Gait Pattern/deviations: Step-through pattern;Decreased stride length Gait velocity: Decreased Gait velocity interpretation: Below normal speed for age/gender General Gait Details: VC's for improved posture. He reports that his balance feels normal and light supervision was provided for safety as this was the first time walking with this therapist. No assist required.   Stairs Stairs: Yes Stairs assistance: Supervision Stair Management: One rail Left;Step to pattern;Forwards Number of Stairs: 3 General stair comments: VC's for general sequencing. No assist required.   Wheelchair Mobility    Modified Rankin (Stroke Patients Only)       Balance Overall balance assessment: No apparent balance deficits (not formally assessed) (At baseline per pt)                                           Pertinent Vitals/Pain Pain Assessment: Faces Faces Pain Scale: Hurts a little bit Pain Location: Incision site Pain Descriptors / Indicators: Operative site guarding Pain Intervention(s): Limited activity within patient's tolerance;Monitored during session;Repositioned    Home Living Family/patient expects to be discharged to:: Private residence Living Arrangements: Spouse/significant other Available Help at Discharge: Family;Available 24 hours/day Type of Home: House Home Access: Stairs to enter Entrance Stairs-Rails: Psychiatric nurse of Steps: 2 Home Layout: One level (1 step to enter a room but otherwise 1 level) Home Equipment: Adaptive equipment      Prior Function Level of Independence: Needs assistance   Gait / Transfers Assistance  Needed: Reportedly independent  ADL's / Homemaking Assistance Needed: Wife reports she was assisting with LB dressing        Hand Dominance        Extremity/Trunk Assessment   Upper  Extremity Assessment Upper Extremity Assessment: Defer to OT evaluation    Lower Extremity Assessment Lower Extremity Assessment: Overall WFL for tasks assessed    Cervical / Trunk Assessment Cervical / Trunk Assessment: Other exceptions Cervical / Trunk Exceptions: s/p lumbar surgery  Communication   Communication: HOH  Cognition Arousal/Alertness: Awake/alert Behavior During Therapy: WFL for tasks assessed/performed Overall Cognitive Status: Within Functional Limits for tasks assessed                      General Comments      Exercises     Assessment/Plan    PT Assessment Patent does not need any further PT services  PT Problem List         PT Treatment Interventions      PT Goals (Current goals can be found in the Care Plan section)  Acute Rehab PT Goals Patient Stated Goal: Home today PT Goal Formulation: All assessment and education complete, DC therapy    Frequency     Barriers to discharge        Co-evaluation               End of Session Equipment Utilized During Treatment: Back brace Activity Tolerance: Patient tolerated treatment well Patient left: in chair;with call bell/phone within reach;with family/visitor present Nurse Communication: Mobility status PT Visit Diagnosis: Pain Pain - part of body:  (Back)         Time: 0258-5277 PT Time Calculation (min) (ACUTE ONLY): 10 min   Charges:   PT Evaluation $PT Eval Moderate Complexity: 1 Procedure     PT G CodesThelma Comp 01/31/2017, 9:26 AM  Rolinda Roan, PT, DPT Acute Rehabilitation Services Pager: [phone]

## 2017-01-31 NOTE — Evaluation (Signed)
Occupational Therapy Evaluation Patient Details Name: Gregory Scott MRN: [id number] DOB: 04/11/1941 Today's Date: 01/31/2017    History of Present Illness Pt is a 76 y/o male who presents s/p L5-S1 fusion on 01/30/17.    Clinical Impression   Pt reports he was independent with ADL PTA; wife reports she occasionally assists with LB dressing. Currently pt overall supervision with ADL and functional mobility with the exception of min assist for LB ADL. All back, safety, and ADL education completed with pt and wife. Pt planning to d/c home with 24/7 supervision from family. No further acute OT needs identified; signing off at this time. Please re-consult if needs change. Thank you for this referral.    Follow Up Recommendations  No OT follow up;Supervision/Assistance - 24 hour (initially)    Equipment Recommendations  None recommended by OT    Recommendations for Other Services       Precautions / Restrictions Precautions Precautions: Fall;Back Precaution Booklet Issued: No Precaution Comments: Pt able to recall 2/3 precuations; reviewed all precautions with pt and wife Required Braces or Orthoses: Spinal Brace Spinal Brace: Thoracolumbosacral orthotic;Applied in sitting position Restrictions Weight Bearing Restrictions: No      Mobility Bed Mobility               General bed mobility comments: Pt OOB in chair upon arrival  Transfers Overall transfer level: Needs assistance Equipment used: None Transfers: Sit to/from Stand Sit to Stand: Supervision         General transfer comment: For safety; no unsteadiness or LOB noted    Balance Overall balance assessment: No apparent balance deficits (not formally assessed)                                          ADL Overall ADL's : Needs assistance/impaired Eating/Feeding: Independent;Sitting   Grooming: Supervision/safety;Standing Grooming Details (indicate cue type and reason): Educated on use  of 2 cups for oral care Upper Body Bathing: Supervision/ safety;Sitting   Lower Body Bathing: Supervison/ safety;Sit to/from stand   Upper Body Dressing : Supervision/safety;Sitting Upper Body Dressing Details (indicate cue type and reason): Reports he was able to don brace independently this AM Lower Body Dressing: Minimal assistance;Sit to/from stand Lower Body Dressing Details (indicate cue type and reason): Wife assists with dressing as needed Toilet Transfer: Supervision/safety;Ambulation;BSC Toilet Transfer Details (indicate cue type and reason): Educated on use of 3 in 1 over toilet. Simulated transfer by sit to stand from chair with functional mobility   Toileting - Clothing Manipulation Details (indicate cue type and reason): Educated on proper technique for peri care and use of wet wipes. Tub/ Shower Transfer: Supervision/safety;Walk-in shower;Ambulation;3 in 1 Tub/Shower Transfer Details (indicate cue type and reason): Educated on use of 3 in 1 in shower as a seat and supervision for safety Functional mobility during ADLs: Supervision/safety General ADL Comments: Educated pt on maintaining back precautions during functional activities, keeping frequently used items at counter top height, frequent mobility throhgout the day upon return home.     Vision         Perception     Praxis      Pertinent Vitals/Pain Pain Assessment: No/denies pain Faces Pain Scale: Hurts a little bit Pain Location: Incision site Pain Descriptors / Indicators: Operative site guarding Pain Intervention(s): Limited activity within patient's tolerance;Monitored during session;Repositioned     Hand Dominance  Extremity/Trunk Assessment Upper Extremity Assessment Upper Extremity Assessment: Overall WFL for tasks assessed   Lower Extremity Assessment Lower Extremity Assessment: Defer to PT evaluation   Cervical / Trunk Assessment Cervical / Trunk Assessment: Other exceptions Cervical /  Trunk Exceptions: s/p lumbar surgery   Communication Communication Communication: HOH   Cognition Arousal/Alertness: Awake/alert Behavior During Therapy: WFL for tasks assessed/performed Overall Cognitive Status: Within Functional Limits for tasks assessed                     General Comments       Exercises       Shoulder Instructions      Home Living Family/patient expects to be discharged to:: Private residence Living Arrangements: Spouse/significant other Available Help at Discharge: Family;Available 24 hours/day Type of Home: House Home Access: Stairs to enter [employer] of Steps: 2 Entrance Stairs-Rails: Right;Left Home Layout: One level (1 step from sunroom to main house)     Bathroom Shower/Tub: Occupational psychologist: Standard     Home Equipment: Research scientist (life sciences);Bedside commode;Grab bars - tub/shower Adaptive Equipment: Reacher        Prior Functioning/Environment Level of Independence: Needs assistance  Gait / Transfers Assistance Needed: Reportedly independent ADL's / Homemaking Assistance Needed: Wife reports she was assisting with LB dressing            OT Problem List:        OT Treatment/Interventions:      OT Goals(Current goals can be found in the care plan section) Acute Rehab OT Goals Patient Stated Goal: Home today OT Goal Formulation: All assessment and education complete, DC therapy  OT Frequency:     Barriers to D/C:            Co-evaluation              End of Session Equipment Utilized During Treatment: Back brace Nurse Communication: Mobility status;Other (comment) (pt ready for d/c home)  Activity Tolerance: Patient tolerated treatment well Patient left: in chair;with family/visitor present  OT Visit Diagnosis: Unsteadiness on feet (R26.81)                ADL either performed or assessed with clinical judgement  Time: 4235-3614 OT Time Calculation (min): 11 min Charges:  OT  General Charges $OT Visit: 1 Procedure OT Evaluation $OT Eval Moderate Complexity: 1 Procedure G-Codes:     Oshay Stranahan A. Ulice Brilliant, M.S., OTR/L Pager: [place] 01/31/2017, 9:38 AM

## 2017-02-01 ENCOUNTER — Encounter (HOSPITAL_COMMUNITY): Payer: Self-pay | Admitting: Orthopedic Surgery

## 2017-02-07 NOTE — Discharge Summary (Signed)
Patient ID: Gregory Scott MRN: [id number] DOB/AGE: [birth year] 77 y.o.  Admit date: 01/30/2017 Discharge date: 01/31/2017  Admission Diagnoses:  Active Problems:   Radiculopathy   Discharge Diagnoses:  Same  Past Medical History:  Diagnosis Date  . Acute MI anterior wall first episode care ([place]) 09/21/2015  . Anemia   . Arthritis    back  . Coronary artery disease   . Emphysema lung ([place])    [place] [facility]  . GERD (gastroesophageal reflux disease)   . History of hiatal hernia   . History of kidney stones    2 lt kidney now  01/22/17  . Hypothyroidism   . Lung cancer ([place])    small part in the right lower   . Prostate cancer ([place])   . PTSD (post-traumatic stress disorder)   . Sleep apnea    sleep study-   [facility]    [place]  [facility]    Surgeries: Procedure(s): LUMBAR 5-SACRAL 1 ANTERIOR INTERBODY FUSION LUMBAR LUMBAR 5-SACRUM 1 POSTERIOR LUMBAR FUSION WITH INSTRUMENTATION AND ALLOGRAFT ABDOMINAL EXPOSURE on 01/30/2017   Consultants:   DR. Donnetta Hutching Vascular exposure   Discharged Condition: Improved  Hospital Course: Gregory Scott is an 76 y.o. male who was admitted 01/30/2017 for operative treatment of radiculopathy. Patient has severe unremitting pain that affects sleep, daily activities, and work/hobbies. After pre-op clearance the patient was taken to the operating room on 01/30/2017 and underwent  Procedure(s): LUMBAR 5-SACRAL 1 ANTERIOR INTERBODY FUSION LUMBAR LUMBAR 5-SACRUM 1 POSTERIOR LUMBAR FUSION WITH INSTRUMENTATION AND ALLOGRAFT ABDOMINAL EXPOSURE.    Patient was given perioperative antibiotics:  Anti-infectives    Start     Dose/Rate Route Frequency Ordered Stop   01/30/17 1645  ceFAZolin (ANCEF) IVPB 2g/100 mL premix     2 g 200 mL/hr over 30 Minutes Intravenous Every 8 hours 01/30/17 1631 01/30/17 2356   01/30/17 0713  ceFAZolin (ANCEF) IVPB 2g/100 mL premix     2 g 200 mL/hr over 30 Minutes Intravenous On call to O.R. 01/30/17 1157  01/30/17 0910       Patient was given sequential compression devices, early ambulation to prevent DVT.  Patient benefited maximally from hospital stay and there were no complications.    Recent vital signs: BP (!) 100/53   Pulse 68   Temp 99 F (37.2 C)   Resp 16   SpO2 94%   Discharge Medications:   Allergies as of 01/31/2017      Reactions   Statins Other (See Comments)   MYALGIAS Legs hurt in high doses      Medication List    TAKE these medications   Carboxymethylcellulose Sodium 1 % Gel Place 1 drop into both eyes at bedtime. Refresh Gel Drop   ferrous sulfate 325 (65 FE) MG tablet Take 325 mg by mouth every Monday, Wednesday, and Friday. After lunch   gabapentin 300 MG capsule Commonly known as:  NEURONTIN Take 1,200 mg by mouth at bedtime.   levothyroxine 50 MCG tablet Commonly known as:  SYNTHROID, LEVOTHROID Take 50 mcg by mouth daily before breakfast.   nitroGLYCERIN 0.4 MG SL tablet Commonly known as:  NITROSTAT Place 1 tablet (0.4 mg total) under the tongue every 5 (five) minutes x 3 doses as needed for chest pain.   omeprazole 20 MG capsule Commonly known as:  PRILOSEC Take 20 mg by mouth See admin instructions. Take 1 capsule (20 MG) twice daily on Sunday, Tuesday, Thursday, & Saturday (  with lunch & at bedtime)   PROVENTIL HFA 108 (90 Base) MCG/ACT inhaler Generic drug:  albuterol Take 1-2 puffs by mouth every 6 (six) hours as needed for wheezing or shortness of breath. Reported on 02/01/2016   REFRESH OP Place 1-2 drops into both eyes 4 (four) times daily.   rosuvastatin 20 MG tablet Commonly known as:  CRESTOR Take 10 mg by mouth at bedtime.       Diagnostic Studies: Dg Lumbar Spine 2-3 Views  Result Date: 01/30/2017 CLINICAL DATA:  PLIF and ALIF of L5-S1. EXAM: LUMBAR SPINE - 2-3 VIEW COMPARISON:  Intraoperative radiographs from the same day. FINDINGS: Following anterior interbody fusion, posterior pedicle screw and rod fixation is now  present at L5-S1. The hardware is intact. IMPRESSION: Status post anterior and posterior fusion at L5-S1 without radiographic evidence for complication. Electronically Signed   By: San Morelle M.D.   On: 01/30/2017 13:54   Dg Lumbar Spine 2-3 Views  Result Date: 01/30/2017 CLINICAL DATA:  L5-S1 fusion. EXAM: DG C-ARM 61-120 MIN; LUMBAR SPINE - 2-3 VIEW COMPARISON:  11/17/2016 CT abdomen FINDINGS: Intraoperative fluoroscopic spot images demonstrate interbody spacer marker at L5-S1 in satisfactory position, and anterior screw with washer at the S1 level. Pars defects at L5 are most appreciable on the lateral projection, with grade 1 anterolisthesis at L5-S1. No complicating feature observed. IMPRESSION: 1. ALIF at L5-S1 with expected location of interbody spacer markers and screw fixator. No complicating feature observed. Electronically Signed   By: Van Clines M.D.   On: 01/30/2017 12:24   Dg C-arm 1-60 Min  Result Date: 01/30/2017 CLINICAL DATA:  L5-S1 fusion. EXAM: DG C-ARM 61-120 MIN; LUMBAR SPINE - 2-3 VIEW COMPARISON:  11/17/2016 CT abdomen FINDINGS: Intraoperative fluoroscopic spot images demonstrate interbody spacer marker at L5-S1 in satisfactory position, and anterior screw with washer at the S1 level. Pars defects at L5 are most appreciable on the lateral projection, with grade 1 anterolisthesis at L5-S1. No complicating feature observed. IMPRESSION: 1. ALIF at L5-S1 with expected location of interbody spacer markers and screw fixator. No complicating feature observed. Electronically Signed   By: Van Clines M.D.   On: 01/30/2017 12:24   Dg Or Local Abdomen  Result Date: 01/30/2017 CLINICAL DATA:  ALIF.  Intraoperative. EXAM: OR LOCAL ABDOMEN COMPARISON:  11/17/2016 CT abdomen/ pelvis. FINDINGS: Single screw overlies the right paramedian upper sacrum. Bone cage overlies the L5-S1 disc space. A few surgical clips are seen adjacent to the screw and bone cage. Retractor  overlies the far right upper quadrant of the abdomen. Brachytherapy seeds overlie the pubic symphysis. No additional radiopaque foreign body. Nonobstructive bowel gas pattern. No evidence of pneumatosis or pneumoperitoneum. Punctate stone in the mid left kidney. IMPRESSION: Expected surgical hardware in the L5-S1 region. No unexpected radiopaque foreign body. Left nephrolithiasis. These results were called by telephone at the time of interpretation on 01/30/2017 at 12:08 pm to [place], who verbally acknowledged these results. Electronically Signed   By: Ilona Sorrel M.D.   On: 01/30/2017 12:10    Disposition: 01-Home or Self Care   POD #1 s/p L5/S1 A/P fusion procedure, doing well  - up with PT/OT, encourage ambulation - Percocet for pain, Valium for muscle spasms. Patient may take tylenol as needed as well. -Written scripts for pain signed and in chart -D/C instructions sheet printed and in chart -D/C today  -F/U in office 2 weeks   Signed: Justice Britain 02/07/2017, 12:28 PM

## 2017-02-11 ENCOUNTER — Other Ambulatory Visit: Payer: Self-pay | Admitting: Orthopedic Surgery

## 2017-02-11 DIAGNOSIS — M545 Low back pain: Secondary | ICD-10-CM

## 2017-02-11 DIAGNOSIS — M4306 Spondylolysis, lumbar region: Secondary | ICD-10-CM | POA: Diagnosis not present

## 2017-02-11 DIAGNOSIS — G8929 Other chronic pain: Secondary | ICD-10-CM

## 2017-02-11 DIAGNOSIS — G8918 Other acute postprocedural pain: Secondary | ICD-10-CM

## 2017-02-12 ENCOUNTER — Ambulatory Visit
Admission: RE | Admit: 2017-02-12 | Discharge: 2017-02-12 | Disposition: A | Discharge status: Home / Self Care | Payer: Medicare Other | Source: Ambulatory Visit | Attending: Orthopedic Surgery | Admitting: Orthopedic Surgery

## 2017-02-12 DIAGNOSIS — M545 Low back pain, unspecified: Secondary | ICD-10-CM

## 2017-02-12 DIAGNOSIS — M47816 Spondylosis without myelopathy or radiculopathy, lumbar region: Secondary | ICD-10-CM | POA: Diagnosis not present

## 2017-02-12 DIAGNOSIS — G8929 Other chronic pain: Secondary | ICD-10-CM

## 2017-02-14 ENCOUNTER — Other Ambulatory Visit: Payer: Self-pay | Admitting: Orthopedic Surgery

## 2017-02-14 DIAGNOSIS — M545 Low back pain: Secondary | ICD-10-CM

## 2017-02-14 DIAGNOSIS — M79605 Pain in left leg: Principal | ICD-10-CM

## 2017-02-14 DIAGNOSIS — M79604 Pain in right leg: Secondary | ICD-10-CM

## 2017-02-21 ENCOUNTER — Ambulatory Visit
Admission: RE | Admit: 2017-02-21 | Discharge: 2017-02-21 | Disposition: A | Discharge status: Home / Self Care | Payer: Medicare Other | Source: Ambulatory Visit | Attending: Orthopedic Surgery | Admitting: Orthopedic Surgery

## 2017-02-21 DIAGNOSIS — M79605 Pain in left leg: Principal | ICD-10-CM

## 2017-02-21 DIAGNOSIS — I70213 Atherosclerosis of native arteries of extremities with intermittent claudication, bilateral legs: Secondary | ICD-10-CM | POA: Diagnosis not present

## 2017-02-21 DIAGNOSIS — M79604 Pain in right leg: Secondary | ICD-10-CM

## 2017-02-25 DIAGNOSIS — Z9889 Other specified postprocedural states: Secondary | ICD-10-CM | POA: Diagnosis not present

## 2017-02-27 ENCOUNTER — Other Ambulatory Visit: Payer: Self-pay | Admitting: Orthopedic Surgery

## 2017-02-27 ENCOUNTER — Ambulatory Visit
Admission: RE | Admit: 2017-02-27 | Discharge: 2017-02-27 | Disposition: A | Discharge status: Home / Self Care | Payer: Medicare Other | Source: Ambulatory Visit | Attending: Orthopedic Surgery | Admitting: Orthopedic Surgery

## 2017-02-27 DIAGNOSIS — M48061 Spinal stenosis, lumbar region without neurogenic claudication: Secondary | ICD-10-CM | POA: Diagnosis not present

## 2017-02-27 DIAGNOSIS — Z1389 Encounter for screening for other disorder: Secondary | ICD-10-CM

## 2017-02-27 DIAGNOSIS — M545 Low back pain: Secondary | ICD-10-CM

## 2017-02-27 DIAGNOSIS — Z0389 Encounter for observation for other suspected diseases and conditions ruled out: Secondary | ICD-10-CM | POA: Diagnosis not present

## 2017-03-18 DIAGNOSIS — D649 Anemia, unspecified: Secondary | ICD-10-CM | POA: Diagnosis not present

## 2017-03-18 DIAGNOSIS — M5416 Radiculopathy, lumbar region: Secondary | ICD-10-CM | POA: Diagnosis not present

## 2017-04-08 DIAGNOSIS — E785 Hyperlipidemia, unspecified: Secondary | ICD-10-CM | POA: Diagnosis not present

## 2017-04-08 DIAGNOSIS — M1990 Unspecified osteoarthritis, unspecified site: Secondary | ICD-10-CM | POA: Diagnosis not present

## 2017-04-08 DIAGNOSIS — I252 Old myocardial infarction: Secondary | ICD-10-CM | POA: Diagnosis not present

## 2017-04-08 DIAGNOSIS — M5416 Radiculopathy, lumbar region: Secondary | ICD-10-CM | POA: Diagnosis not present

## 2017-04-08 DIAGNOSIS — I10 Essential (primary) hypertension: Secondary | ICD-10-CM | POA: Diagnosis not present

## 2017-04-08 DIAGNOSIS — F4310 Post-traumatic stress disorder, unspecified: Secondary | ICD-10-CM | POA: Diagnosis not present

## 2017-04-08 DIAGNOSIS — I2510 Atherosclerotic heart disease of native coronary artery without angina pectoris: Secondary | ICD-10-CM | POA: Diagnosis not present

## 2017-04-08 DIAGNOSIS — E039 Hypothyroidism, unspecified: Secondary | ICD-10-CM | POA: Diagnosis not present

## 2017-04-08 DIAGNOSIS — J449 Chronic obstructive pulmonary disease, unspecified: Secondary | ICD-10-CM | POA: Diagnosis not present

## 2017-04-16 DIAGNOSIS — N63 Unspecified lump in unspecified breast: Secondary | ICD-10-CM | POA: Diagnosis not present

## 2017-04-16 DIAGNOSIS — K5903 Drug induced constipation: Secondary | ICD-10-CM | POA: Diagnosis not present

## 2017-04-16 DIAGNOSIS — R1032 Left lower quadrant pain: Secondary | ICD-10-CM | POA: Diagnosis not present

## 2017-04-23 ENCOUNTER — Other Ambulatory Visit: Payer: Self-pay | Admitting: Family Medicine

## 2017-04-23 DIAGNOSIS — N63 Unspecified lump in unspecified breast: Secondary | ICD-10-CM

## 2017-04-24 ENCOUNTER — Other Ambulatory Visit: Payer: Self-pay | Admitting: Family Medicine

## 2017-04-24 DIAGNOSIS — N63 Unspecified lump in unspecified breast: Secondary | ICD-10-CM

## 2017-04-29 ENCOUNTER — Ambulatory Visit
Admission: RE | Admit: 2017-04-29 | Discharge: 2017-04-29 | Disposition: A | Discharge status: Home / Self Care | Payer: Medicare Other | Source: Ambulatory Visit | Attending: Family Medicine | Admitting: Family Medicine

## 2017-04-29 DIAGNOSIS — M545 Low back pain: Secondary | ICD-10-CM | POA: Diagnosis not present

## 2017-04-29 DIAGNOSIS — N63 Unspecified lump in unspecified breast: Secondary | ICD-10-CM

## 2017-04-29 DIAGNOSIS — R928 Other abnormal and inconclusive findings on diagnostic imaging of breast: Secondary | ICD-10-CM | POA: Diagnosis not present

## 2017-05-02 DIAGNOSIS — R310 Gross hematuria: Secondary | ICD-10-CM | POA: Diagnosis not present

## 2017-05-02 DIAGNOSIS — C61 Malignant neoplasm of prostate: Secondary | ICD-10-CM | POA: Diagnosis not present

## 2017-05-02 DIAGNOSIS — R339 Retention of urine, unspecified: Secondary | ICD-10-CM | POA: Diagnosis not present

## 2017-05-07 DIAGNOSIS — M25561 Pain in right knee: Secondary | ICD-10-CM | POA: Diagnosis not present

## 2017-05-16 DIAGNOSIS — R911 Solitary pulmonary nodule: Secondary | ICD-10-CM | POA: Diagnosis not present

## 2017-05-16 DIAGNOSIS — C61 Malignant neoplasm of prostate: Secondary | ICD-10-CM | POA: Diagnosis not present

## 2017-05-16 DIAGNOSIS — R310 Gross hematuria: Secondary | ICD-10-CM | POA: Diagnosis not present

## 2017-05-16 DIAGNOSIS — N359 Urethral stricture, unspecified: Secondary | ICD-10-CM | POA: Diagnosis not present

## 2017-05-16 DIAGNOSIS — N200 Calculus of kidney: Secondary | ICD-10-CM | POA: Diagnosis not present

## 2017-05-30 DIAGNOSIS — C3431 Malignant neoplasm of lower lobe, right bronchus or lung: Secondary | ICD-10-CM | POA: Diagnosis not present

## 2017-05-30 DIAGNOSIS — R911 Solitary pulmonary nodule: Secondary | ICD-10-CM | POA: Diagnosis not present

## 2017-05-30 DIAGNOSIS — C3490 Malignant neoplasm of unspecified part of unspecified bronchus or lung: Secondary | ICD-10-CM | POA: Diagnosis not present

## 2017-05-30 DIAGNOSIS — R599 Enlarged lymph nodes, unspecified: Secondary | ICD-10-CM | POA: Diagnosis not present

## 2017-05-30 DIAGNOSIS — K449 Diaphragmatic hernia without obstruction or gangrene: Secondary | ICD-10-CM | POA: Diagnosis not present

## 2017-06-04 DIAGNOSIS — M25561 Pain in right knee: Secondary | ICD-10-CM | POA: Diagnosis not present

## 2017-06-07 DIAGNOSIS — Z1389 Encounter for screening for other disorder: Secondary | ICD-10-CM | POA: Diagnosis not present

## 2017-06-07 DIAGNOSIS — R911 Solitary pulmonary nodule: Secondary | ICD-10-CM | POA: Diagnosis not present

## 2017-06-10 DIAGNOSIS — M545 Low back pain: Secondary | ICD-10-CM | POA: Diagnosis not present

## 2017-06-14 ENCOUNTER — Encounter: Payer: Self-pay | Admitting: Emergency Medicine

## 2017-06-14 ENCOUNTER — Telehealth: Payer: Self-pay | Admitting: Emergency Medicine

## 2017-06-14 ENCOUNTER — Ambulatory Visit (INDEPENDENT_AMBULATORY_CARE_PROVIDER_SITE_OTHER): Payer: Medicare Other | Admitting: Emergency Medicine

## 2017-06-14 VITALS — BP 146/82 | HR 60 | Ht 69.0 in | Wt 156.0 lb

## 2017-06-14 DIAGNOSIS — R9389 Abnormal findings on diagnostic imaging of other specified body structures: Secondary | ICD-10-CM | POA: Insufficient documentation

## 2017-06-14 DIAGNOSIS — R911 Solitary pulmonary nodule: Secondary | ICD-10-CM

## 2017-06-14 DIAGNOSIS — R0600 Dyspnea, unspecified: Secondary | ICD-10-CM | POA: Insufficient documentation

## 2017-06-14 DIAGNOSIS — R0602 Shortness of breath: Secondary | ICD-10-CM

## 2017-06-14 DIAGNOSIS — R938 Abnormal findings on diagnostic imaging of other specified body structures: Secondary | ICD-10-CM | POA: Diagnosis not present

## 2017-06-14 NOTE — Assessment & Plan Note (Signed)
Some exertional shortness of breath. Changes on CT scan that are suggestive of possible emphysema. We will perform spirometry today to better assess, given an indication of his functional capacity prior to a possible procedure, general anesthesia.

## 2017-06-14 NOTE — Telephone Encounter (Signed)
Pt seen today by RB: Patient Instructions  We discussed possible strategies to biopsy your enlarged lymph node and your right lower lobe pulmonary nodule. I believe that the best way for [place] to start is to perform a procedure called a bronchoscopy under general anesthesia to allow [place] to isolate and biopsy the enlarged lymph node. If the lymph node is benign then we should also arrange to perform a needle biopsy on your right lower lobe nodule. Please discussed this plan with your wife and then call us to let us know if she would like to set up the bronchoscopy.  We will perform spirometry today Follow with Dr Lamonte Sakai next available.     Called spoke with patient's spouse Arbie Cookey (dpr on file) let them know that message will be forwarded to [place] to make him aware that pt would like to proceed with bronch and we will call them back once RB provides dates/times for the procedure.  Arbie Cookey okay with this and voiced her understanding.  RB please advise on bronch dates/times.  Thank you!

## 2017-06-14 NOTE — Assessment & Plan Note (Addendum)
Slowly enlarging irregularly shaped right lower lobe nodule, very mildly hypermetabolic on PET scan. 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 Nothing in the history to suggest tuberculosis  Also with a 1 cm precarinal node with some hypermetabolic activity. Again unclear whether this is reactive or clinically significant. I discussed with him the potential options for biopsy. In particular I recommended that he might be a candidate for resection by thoracic surgery, especially if his precarinal node is negative. He would like to be more conservative, avoid thoracic surgery at this time if possible. I have recommended that he have bronchoscopy with EBUS to sample of note, if negative then he would need a transthoracic needle biopsy of the right lower lobe nodule. He understands the options. He wants to go home and discuss these with his wife and then let us know if and when he would like to proceed.

## 2017-06-14 NOTE — Progress Notes (Signed)
Subjective:    Patient ID: Gregory Scott, male    DOB: [birth year], 76 y.o.   MRN: [id number]  HPI 76 year old never smoker with a history of prostate cancer, also with pulmonary nodules that are been followed with serial CT scans of the chest. There has been a lower lobe ill-defined nodule that is increased in size prompting a PET CT scan on 05/30/17 that I have reviewed. This shows an ill-defined nodular right lower lobe lobulated lesion measures 27 x 11 mm creased in size compared with 2016 CT scan. There is very mild metabolic activity (SUV 1.8). Also noted is a 1 cm precarinal lymph node with mild hypermetabolic activity (SUV 3.4). He has never had prostatic spread, PSA has been normal.   Past medical history also significant for coronary disease, MI and stenting, arthritis, hypothyroidism, sleep apnea (good compliance CPAP). Recent back surgery that required general anesthesia.   No significant coughing. No fever, no night sweats. No known TB exposure. He has some exertional dyspnea. Still able to do his chores.    Review of Systems  Constitutional: Positive for unexpected weight change. Negative for fever.  HENT: Positive for congestion. Negative for dental problem, ear pain, nosebleeds, postnasal drip, rhinorrhea, sinus pressure, sneezing, sore throat and trouble swallowing.   Eyes: Negative for redness and itching.  Respiratory: Positive for cough and shortness of breath. Negative for chest tightness and wheezing.   Cardiovascular: Positive for palpitations. Negative for leg swelling.  Gastrointestinal: Negative for nausea and vomiting.  Genitourinary: Negative for dysuria.  Musculoskeletal: Negative for joint swelling.  Skin: Negative for rash.  Neurological: Negative for headaches.  Hematological: Does not bruise/bleed easily.  Psychiatric/Behavioral: Positive for dysphoric mood. The patient is not nervous/anxious.    Past Medical History:  Diagnosis Date  . Acute MI  anterior wall first episode care ([place]) 09/21/2015  . Anemia   . Arthritis    back  . Coronary artery disease   . Emphysema lung ([place])    [facility]  . GERD (gastroesophageal reflux disease)   . History of hiatal hernia   . History of kidney stones    2 lt kidney now  01/22/17  . Hypothyroidism   . Lung cancer ([place])    small part in the right lower   . Prostate cancer ([place])   . PTSD (post-traumatic stress disorder)   . Sleep apnea    sleep [place]-   [place]    [place]  [facility]     Family History  Problem Relation Age of Onset  . Coronary artery disease Mother 44     Social History   Social History  . Marital status: Married    Spouse name: N/A  . Number of children: N/A  . Years of education: N/A   Occupational History  . Not on file.   Social History Main Topics  . Smoking status: Never Smoker  . Smokeless tobacco: Never Used  . Alcohol use No  . Drug use: No  . Sexual activity: Not on file   Other Topics Concern  . 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  Worked in a foundry, exposed to [place], then for the [employer].   Allergies  Allergen Reactions  . Statins Other (See Comments)    MYALGIAS Legs hurt  in high doses     Outpatient Medications Prior to Visit  Medication Sig Dispense Refill  . Carboxymethylcellulose Sodium 1 % GEL Place 1 drop into both eyes at bedtime. Refresh Gel Drop    . ferrous sulfate 325 (65 FE) MG tablet Take 325 mg by mouth every Monday, Wednesday, and Friday. After lunch    . gabapentin (NEURONTIN) 300 MG capsule Take 1,200 mg by mouth at bedtime.     Marland Kitchen levothyroxine (SYNTHROID, LEVOTHROID) 50 MCG tablet Take 50 mcg by mouth daily before breakfast.    . nitroGLYCERIN (NITROSTAT) 0.4 MG SL tablet Place 1 tablet (0.4 mg total) under the tongue every 5 (five) minutes x 3 doses as needed for chest  pain. 25 tablet 0  . omeprazole (PRILOSEC) 20 MG capsule Take 20 mg by mouth See admin instructions. Take 1 capsule (20 MG) twice daily on Sunday, Tuesday, Thursday, & Saturday (with lunch & at bedtime)    . Polyvinyl Alcohol-Povidone (REFRESH OP) Place 1-2 drops into both eyes 4 (four) times daily.    Marland Kitchen PROVENTIL HFA 108 (90 BASE) MCG/ACT inhaler Take 1-2 puffs by mouth every 6 (six) hours as needed for wheezing or shortness of breath. Reported on 02/01/2016    . rosuvastatin (CRESTOR) 20 MG tablet Take 10 mg by mouth at bedtime.     No facility-administered medications prior to visit.         Objective:   Physical Exam Vitals:   06/14/17 0935 06/14/17 0936  BP:  (!) 146/82  Pulse:  60  SpO2:  99%  Weight: 156 lb (70.8 kg)   Height: 5\' 9"  (1.753 m)    Gen: Pleasant, thin man,  in no distress,  normal affect  ENT: No lesions,  mouth clear,  oropharynx clear, no postnasal drip  Neck: No JVD, no stridor  Lungs: No use of accessory muscles, distant but clear  Cardiovascular: RRR, heart sounds normal, no murmur or gallops, no peripheral edema  Musculoskeletal: No deformities, no cyanosis or clubbing  Neuro: alert, mild resting tremor, nonfocal, no deficits noted  Skin: Warm, no lesions or rash      Assessment & Plan:  Abnormal CT scan, chest Slowly enlarging irregularly shaped right lower lobe nodule, very mildly hypermetabolic on PET scan. 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 Nothing in the history to suggest tuberculosis  Also with a 1 cm precarinal node with some hypermetabolic activity. Again unclear whether this is reactive or clinically significant. I discussed with him the potential options for biopsy. In particular I recommended that he might be a candidate for resection by thoracic surgery, especially if his precarinal node is negative. He would like to be more conservative, avoid thoracic surgery at this time if possible.  I have recommended that he have bronchoscopy with EBUS to sample of note, if negative then he would need a transthoracic needle biopsy of the right lower lobe nodule. He understands the options. He wants to go home and discuss these with his wife and then let us know if and when he would like to proceed.  Dyspnea Some exertional shortness of breath. Changes on CT scan that are suggestive of possible emphysema. We will perform spirometry today to better assess, given an indication of his functional capacity prior to a possible procedure, general anesthesia.  Baltazar Apo, MD, PhD 06/14/2017, 10:12 AM [facility] [phone] or if no answer [phone]

## 2017-06-14 NOTE — Patient Instructions (Signed)
We discussed possible strategies to biopsy your enlarged lymph node and your right lower lobe pulmonary nodule. I believe that the best way for [place] to start is to perform a procedure called a bronchoscopy under general anesthesia to allow [place] to isolate and biopsy the enlarged lymph node. If the lymph node is benign then we should also arrange to perform a needle biopsy on your right lower lobe nodule. Please discussed this plan with your wife and then call us to let us know if she would like to set up the bronchoscopy.  We will perform spirometry today Follow with Dr Lamonte Sakai next available.

## 2017-06-17 NOTE — Telephone Encounter (Signed)
Will work on setting up EBUS for this week

## 2017-06-17 NOTE — Telephone Encounter (Signed)
Order has been placed for EBUS per RB. Libby schedules this type of procedure. Will keep message open until procedure is scheduled.

## 2017-06-17 NOTE — Telephone Encounter (Signed)
Per [facility], pt has been scheduled for EBUS on 06/19/17 at 10am at [facility]. [facility] will be making the pt aware of this procedure. RB is aware of procedure date, time and location.

## 2017-06-18 ENCOUNTER — Encounter (HOSPITAL_COMMUNITY): Payer: Self-pay | Admitting: *Deleted

## 2017-06-19 ENCOUNTER — Encounter (HOSPITAL_COMMUNITY)
Admission: RE | Disposition: A | Discharge status: Home / Self Care | Payer: Self-pay | Source: Ambulatory Visit | Attending: Emergency Medicine

## 2017-06-19 ENCOUNTER — Encounter (HOSPITAL_COMMUNITY): Payer: Self-pay | Admitting: *Deleted

## 2017-06-19 ENCOUNTER — Ambulatory Visit (HOSPITAL_COMMUNITY)
Admission: RE | Admit: 2017-06-19 | Discharge: 2017-06-19 | Disposition: A | Discharge status: Home / Self Care | Payer: Medicare Other | Source: Ambulatory Visit | Attending: Emergency Medicine | Admitting: Emergency Medicine

## 2017-06-19 ENCOUNTER — Ambulatory Visit (HOSPITAL_COMMUNITY): Payer: Medicare Other | Admitting: Anesthesiology

## 2017-06-19 DIAGNOSIS — F4310 Post-traumatic stress disorder, unspecified: Secondary | ICD-10-CM | POA: Diagnosis not present

## 2017-06-19 DIAGNOSIS — Z8546 Personal history of malignant neoplasm of prostate: Secondary | ICD-10-CM | POA: Insufficient documentation

## 2017-06-19 DIAGNOSIS — I10 Essential (primary) hypertension: Secondary | ICD-10-CM | POA: Diagnosis not present

## 2017-06-19 DIAGNOSIS — R938 Abnormal findings on diagnostic imaging of other specified body structures: Secondary | ICD-10-CM

## 2017-06-19 DIAGNOSIS — E039 Hypothyroidism, unspecified: Secondary | ICD-10-CM | POA: Insufficient documentation

## 2017-06-19 DIAGNOSIS — Z85118 Personal history of other malignant neoplasm of bronchus and lung: Secondary | ICD-10-CM | POA: Insufficient documentation

## 2017-06-19 DIAGNOSIS — Z8249 Family history of ischemic heart disease and other diseases of the circulatory system: Secondary | ICD-10-CM | POA: Insufficient documentation

## 2017-06-19 DIAGNOSIS — J449 Chronic obstructive pulmonary disease, unspecified: Secondary | ICD-10-CM | POA: Diagnosis not present

## 2017-06-19 DIAGNOSIS — Z79899 Other long term (current) drug therapy: Secondary | ICD-10-CM | POA: Insufficient documentation

## 2017-06-19 DIAGNOSIS — I2510 Atherosclerotic heart disease of native coronary artery without angina pectoris: Secondary | ICD-10-CM | POA: Insufficient documentation

## 2017-06-19 DIAGNOSIS — M1990 Unspecified osteoarthritis, unspecified site: Secondary | ICD-10-CM | POA: Diagnosis not present

## 2017-06-19 DIAGNOSIS — Z888 Allergy status to other drugs, medicaments and biological substances status: Secondary | ICD-10-CM | POA: Insufficient documentation

## 2017-06-19 DIAGNOSIS — R0600 Dyspnea, unspecified: Secondary | ICD-10-CM | POA: Insufficient documentation

## 2017-06-19 DIAGNOSIS — I252 Old myocardial infarction: Secondary | ICD-10-CM | POA: Insufficient documentation

## 2017-06-19 DIAGNOSIS — R911 Solitary pulmonary nodule: Secondary | ICD-10-CM | POA: Diagnosis not present

## 2017-06-19 DIAGNOSIS — R590 Localized enlarged lymph nodes: Secondary | ICD-10-CM | POA: Diagnosis not present

## 2017-06-19 DIAGNOSIS — K219 Gastro-esophageal reflux disease without esophagitis: Secondary | ICD-10-CM | POA: Insufficient documentation

## 2017-06-19 DIAGNOSIS — I25810 Atherosclerosis of coronary artery bypass graft(s) without angina pectoris: Secondary | ICD-10-CM | POA: Diagnosis not present

## 2017-06-19 DIAGNOSIS — G4730 Sleep apnea, unspecified: Secondary | ICD-10-CM | POA: Insufficient documentation

## 2017-06-19 DIAGNOSIS — R9389 Abnormal findings on diagnostic imaging of other specified body structures: Secondary | ICD-10-CM

## 2017-06-19 HISTORY — DX: Enlarged lymph nodes, unspecified: R59.9

## 2017-06-19 HISTORY — PX: VIDEO BRONCHOSCOPY WITH ENDOBRONCHIAL ULTRASOUND: SHX6177

## 2017-06-19 LAB — COMPREHENSIVE METABOLIC PANEL
ALBUMIN: 4.2 g/dL (ref 3.5–5.0)
ALT: 14 U/L — ABNORMAL LOW (ref 17–63)
ANION GAP: 8 (ref 5–15)
AST: 20 U/L (ref 15–41)
Alkaline Phosphatase: 63 U/L (ref 38–126)
BUN: 8 mg/dL (ref 6–20)
CHLORIDE: 105 mmol/L (ref 101–111)
CO2: 23 mmol/L (ref 22–32)
Calcium: 9.2 mg/dL (ref 8.9–10.3)
Creatinine, Ser: 0.98 mg/dL (ref 0.61–1.24)
GFR calc Af Amer: >60 mL/min (ref >60)
GFR calc non Af Amer: >60 mL/min (ref >60)
GLUCOSE: 89 mg/dL (ref 65–99)
POTASSIUM: 3.8 mmol/L (ref 3.5–5.1)
SODIUM: 136 mmol/L (ref 135–145)
TOTAL PROTEIN: 6.9 g/dL (ref 6.5–8.1)
Total Bilirubin: 1 mg/dL (ref 0.3–1.2)

## 2017-06-19 LAB — CBC
HEMATOCRIT: 40.7 % (ref 39.0–52.0)
HEMOGLOBIN: 14 g/dL (ref 13.0–17.0)
MCH: 31.2 pg (ref 26.0–34.0)
MCHC: 34.4 g/dL (ref 30.0–36.0)
MCV: 90.6 fL (ref 78.0–100.0)
Platelets: 195 10*3/uL (ref 150–400)
RBC: 4.49 MIL/uL (ref 4.22–5.81)
RDW: 13.1 % (ref 11.5–15.5)
WBC: 5 10*3/uL (ref 4.0–10.5)

## 2017-06-19 LAB — APTT: aPTT: 30 seconds (ref 24–36)

## 2017-06-19 LAB — PROTIME-INR
INR: 1.05
Prothrombin Time: 13.7 seconds (ref 11.4–15.2)

## 2017-06-19 SURGERY — BRONCHOSCOPY, WITH EBUS
Anesthesia: General

## 2017-06-19 MED ORDER — PROPOFOL 10 MG/ML IV BOLUS
INTRAVENOUS | Status: DC | PRN
  Administered 2017-06-19 (×2): 50 mg via INTRAVENOUS

## 2017-06-19 MED ORDER — MIDAZOLAM HCL 2 MG/2ML IJ SOLN
INTRAMUSCULAR | Status: AC
  Filled 2017-06-19: qty 2

## 2017-06-19 MED ORDER — FENTANYL CITRATE (PF) 100 MCG/2ML IJ SOLN
INTRAMUSCULAR | Status: DC | PRN
  Administered 2017-06-19 (×2): 50 ug via INTRAVENOUS

## 2017-06-19 MED ORDER — EPHEDRINE 5 MG/ML INJ
INTRAVENOUS | Status: AC
  Filled 2017-06-19: qty 10

## 2017-06-19 MED ORDER — ONDANSETRON HCL 4 MG/2ML IJ SOLN
INTRAMUSCULAR | Status: DC | PRN
  Administered 2017-06-19: 4 mg via INTRAVENOUS

## 2017-06-19 MED ORDER — PHENYLEPHRINE HCL 10 MG/ML IJ SOLN
INTRAMUSCULAR | Status: DC | PRN
  Administered 2017-06-19 (×5): 40 ug via INTRAVENOUS

## 2017-06-19 MED ORDER — FENTANYL CITRATE (PF) 100 MCG/2ML IJ SOLN: 25.0000 ug | INTRAMUSCULAR | Status: DC | PRN

## 2017-06-19 MED ORDER — MEPERIDINE HCL 25 MG/ML IJ SOLN: 6.2500 mg | INTRAMUSCULAR | Status: DC | PRN

## 2017-06-19 MED ORDER — MIDAZOLAM HCL 5 MG/5ML IJ SOLN
INTRAMUSCULAR | Status: DC | PRN
  Administered 2017-06-19 (×2): 1 mg via INTRAVENOUS

## 2017-06-19 MED ORDER — MIDAZOLAM HCL 2 MG/2ML IJ SOLN: 0.5000 mg | Freq: Once | INTRAMUSCULAR | Status: DC | PRN

## 2017-06-19 MED ORDER — SUGAMMADEX SODIUM 200 MG/2ML IV SOLN
INTRAVENOUS | Status: DC | PRN
  Administered 2017-06-19: 200 mg via INTRAVENOUS

## 2017-06-19 MED ORDER — EPHEDRINE SULFATE 50 MG/ML IJ SOLN
INTRAMUSCULAR | Status: DC | PRN
  Administered 2017-06-19 (×2): 5 mg via INTRAVENOUS
  Administered 2017-06-19: 10 mg via INTRAVENOUS
  Administered 2017-06-19: 5 mg via INTRAVENOUS

## 2017-06-19 MED ORDER — LACTATED RINGERS IV SOLN
INTRAVENOUS | Status: DC
  Administered 2017-06-19: 08:00:00 via INTRAVENOUS

## 2017-06-19 MED ORDER — ONDANSETRON HCL 4 MG/2ML IJ SOLN
INTRAMUSCULAR | Status: AC
  Filled 2017-06-19: qty 2

## 2017-06-19 MED ORDER — SUGAMMADEX SODIUM 200 MG/2ML IV SOLN
INTRAVENOUS | Status: AC
  Filled 2017-06-19: qty 2

## 2017-06-19 MED ORDER — FENTANYL CITRATE (PF) 250 MCG/5ML IJ SOLN
INTRAMUSCULAR | Status: AC
  Filled 2017-06-19: qty 5

## 2017-06-19 MED ORDER — DEXAMETHASONE SODIUM PHOSPHATE 10 MG/ML IJ SOLN
INTRAMUSCULAR | Status: DC | PRN
  Administered 2017-06-19: 10 mg via INTRAVENOUS

## 2017-06-19 MED ORDER — ROCURONIUM BROMIDE 10 MG/ML (PF) SYRINGE
PREFILLED_SYRINGE | INTRAVENOUS | Status: AC
  Filled 2017-06-19: qty 5

## 2017-06-19 MED ORDER — 0.9 % SODIUM CHLORIDE (POUR BTL) OPTIME
TOPICAL | Status: DC | PRN
  Administered 2017-06-19: 1000 mL

## 2017-06-19 MED ORDER — LIDOCAINE 2% (20 MG/ML) 5 ML SYRINGE
INTRAMUSCULAR | Status: AC
  Filled 2017-06-19: qty 5

## 2017-06-19 MED ORDER — DEXAMETHASONE SODIUM PHOSPHATE 10 MG/ML IJ SOLN
INTRAMUSCULAR | Status: AC
  Filled 2017-06-19: qty 1

## 2017-06-19 MED ORDER — LIDOCAINE HCL (CARDIAC) 20 MG/ML IV SOLN
INTRAVENOUS | Status: DC | PRN
  Administered 2017-06-19: 20 mg via INTRAVENOUS

## 2017-06-19 MED ORDER — ROCURONIUM BROMIDE 100 MG/10ML IV SOLN
INTRAVENOUS | Status: DC | PRN
  Administered 2017-06-19: 50 mg via INTRAVENOUS

## 2017-06-19 MED ORDER — PROMETHAZINE HCL 25 MG/ML IJ SOLN: 6.2500 mg | INTRAMUSCULAR | Status: DC | PRN

## 2017-06-19 MED ORDER — PHENYLEPHRINE 40 MCG/ML (10ML) SYRINGE FOR IV PUSH (FOR BLOOD PRESSURE SUPPORT)
PREFILLED_SYRINGE | INTRAVENOUS | Status: AC
  Filled 2017-06-19: qty 10

## 2017-06-19 SURGICAL SUPPLY — 29 items
BRUSH CYTOL CELLEBRITY 1.5X140 (MISCELLANEOUS) IMPLANT
CANISTER SUCT 3000ML PPV (MISCELLANEOUS) ×3 IMPLANT
CONT SPEC 4OZ CLIKSEAL STRL BL (MISCELLANEOUS) ×3 IMPLANT
COVER BACK TABLE 60X90IN (DRAPES) ×3 IMPLANT
COVER DOME SNAP 22 D (MISCELLANEOUS) ×3 IMPLANT
FORCEPS BIOP RJ4 1.8 (CUTTING FORCEPS) IMPLANT
GAUZE SPONGE 4X4 12PLY STRL (GAUZE/BANDAGES/DRESSINGS) ×3 IMPLANT
GLOVE BIO SURGEON STRL SZ 6.5 (GLOVE) ×2 IMPLANT
GLOVE BIO SURGEON STRL SZ7.5 (GLOVE) ×3 IMPLANT
GLOVE BIO SURGEONS STRL SZ 6.5 (GLOVE) ×1
GOWN STRL REUS W/ TWL LRG LVL3 (GOWN DISPOSABLE) ×1 IMPLANT
GOWN STRL REUS W/TWL LRG LVL3 (GOWN DISPOSABLE) ×2
KIT CLEAN ENDO COMPLIANCE (KITS) ×6 IMPLANT
KIT ROOM TURNOVER OR (KITS) ×3 IMPLANT
MARKER SKIN DUAL TIP RULER LAB (MISCELLANEOUS) ×3 IMPLANT
NEEDLE EBUS SONO TIP PENTAX (NEEDLE) ×3 IMPLANT
NS IRRIG 1000ML POUR BTL (IV SOLUTION) ×3 IMPLANT
OIL SILICONE PENTAX (PARTS (SERVICE/REPAIRS)) ×3 IMPLANT
PAD ARMBOARD 7.5X6 YLW CONV (MISCELLANEOUS) ×6 IMPLANT
SYR 20CC LL (SYRINGE) ×6 IMPLANT
SYR 20ML ECCENTRIC (SYRINGE) ×6 IMPLANT
SYR 50ML SLIP (SYRINGE) ×3 IMPLANT
SYR 5ML LUER SLIP (SYRINGE) ×3 IMPLANT
TOWEL OR 17X24 6PK STRL BLUE (TOWEL DISPOSABLE) ×3 IMPLANT
TRAP SPECIMEN MUCOUS 40CC (MISCELLANEOUS) ×3 IMPLANT
TUBE CONNECTING 20'X1/4 (TUBING) ×2
TUBE CONNECTING 20X1/4 (TUBING) ×4 IMPLANT
UNDERPAD 30X30 (UNDERPADS AND DIAPERS) ×3 IMPLANT
WATER STERILE IRR 1000ML POUR (IV SOLUTION) ×3 IMPLANT

## 2017-06-19 NOTE — Anesthesia Procedure Notes (Signed)
Procedure Name: Intubation Date/Time: 06/19/2017 7:00 PM Performed by: Shirlyn Goltz Pre-anesthesia Checklist: Patient identified, Emergency Drugs available, Suction available, Patient being monitored and Timeout performed Patient Re-evaluated:Patient Re-evaluated prior to induction Oxygen Delivery Method: Circle system utilized Preoxygenation: Pre-oxygenation with 100% oxygen Induction Type: IV induction Ventilation: Mask ventilation without difficulty Laryngoscope Size: Mac and 4 Grade View: Grade I Tube type: Oral Tube size: 8.5 mm Number of attempts: 1 Airway Equipment and Method: Stylet Placement Confirmation: ETT inserted through vocal cords under direct vision,  positive ETCO2 and breath sounds checked- equal and bilateral Secured at: 22 cm Tube secured with: Tape Dental Injury: Teeth and Oropharynx as per pre-operative assessment

## 2017-06-19 NOTE — Anesthesia Postprocedure Evaluation (Signed)
Anesthesia Post Note  Patient: RAFE MACKOWSKI  Procedure(s) Performed: Procedure(s) (LRB): VIDEO BRONCHOSCOPY WITH ENDOBRONCHIAL ULTRASOUND (N/A)     Patient location during evaluation: PACU Anesthesia Type: General Level of consciousness: awake and alert, patient cooperative and oriented Pain management: pain level controlled Vital Signs Assessment: post-procedure vital signs reviewed and stable Respiratory status: spontaneous breathing, nonlabored ventilation, respiratory function stable and patient connected to nasal cannula oxygen Cardiovascular status: blood pressure returned to baseline and stable Postop Assessment: no signs of nausea or vomiting Anesthetic complications: no    Last Vitals:  Vitals:   06/19/17 1214 06/19/17 1218  BP:  140/73  Pulse: 69   Resp: 13   Temp:      Last Pain:  Vitals:   06/19/17 0805  TempSrc: Oral                 Isiac Breighner,E. Laronn Devonshire

## 2017-06-19 NOTE — Transfer of Care (Signed)
Immediate Anesthesia Transfer of Care Note  Patient: Gregory Scott  Procedure(s) Performed: Procedure(s): VIDEO BRONCHOSCOPY WITH ENDOBRONCHIAL ULTRASOUND (N/A)  Patient Location: PACU  Anesthesia Type:General  Level of Consciousness: awake, alert , oriented and patient cooperative  Airway & Oxygen Therapy: Patient Spontanous Breathing and Patient connected to face mask oxygen  Post-op Assessment: Report given to RN and Post -op Vital signs reviewed and stable  Post vital signs: Reviewed and stable  Last Vitals:  Vitals:   06/19/17 0805 06/19/17 1145  BP: (!) 151/88 (!) 146/85  Pulse: (!) 50 77  Resp: 20 15  Temp: 36.8 C 36.6 C    Last Pain:  Vitals:   06/19/17 0805  TempSrc: Oral      Patients Stated Pain Goal: 3 (91/02/89 0228)  Complications: No apparent anesthesia complications

## 2017-06-19 NOTE — Op Note (Signed)
Video Bronchoscopy with Endobronchial Ultrasound Procedure Note  Date of Operation: 06/19/2017  Pre-op Diagnosis: mediastinal lymphadenopathy and RLL nodule  Post-op Diagnosis: Same  Surgeon: Baltazar Apo  Assistants: None  Anesthesia: General endotracheal anesthesia  Operation: Flexible video fiberoptic bronchoscopy with endobronchial ultrasound and biopsies.  Estimated Blood Loss: Minimal  Complications: None apparent  Indications and History: Gregory Scott is a 76 y.o. male with hx prostate CA. We have been following a slowly growing RLL nodule by Ct and PET scan. Most recent PET revealed that the nodule was larger, that he has a hypermetabolic enlarged pre-carinal node. Recommendation made to pursue nodal biopsy via EBUS.  The risks, benefits, complications, treatment options and expected outcomes were discussed with the patient.  The possibilities of pneumothorax, pneumonia, reaction to medication, pulmonary aspiration, perforation of a viscus, bleeding, failure to diagnose a condition and creating a complication requiring transfusion or operation were discussed with the patient who freely signed the consent.    Description of Procedure: The patient was examined in the preoperative area and history and data from the preprocedure consultation were reviewed. It was deemed appropriate to proceed.  The patient was taken to OR10, identified as [name] and the procedure verified as Flexible Video Fiberoptic Bronchoscopy.  A Time Out was held and the above information confirmed. After being taken to the operating room general anesthesia was initiated and the patient  was orally intubated. The video fiberoptic bronchoscope was introduced via the endotracheal tube and a general inspection was performed which showed normal airways throughout. The standard scope was then withdrawn and the endobronchial ultrasound was used to identify and characterize the peritracheal, hilar and  bronchial lymph nodes. Inspection showed a cluster of enlarged 4R nodes in the precarinal region. Nodes were also identified at 11R and 11L. Using real-time ultrasound guidance Wang needle biopsies were take from Station 4R, 11R and 11L nodes and were sent for cytology. Finally a RUL BAL was performed to be sent for culture data. The patient tolerated the procedure well without apparent complications. There was no significant blood loss. The bronchoscope was withdrawn. Anesthesia was reversed and the patient was taken to the PACU for recovery.   Samples: 1. Wang needle biopsies from 4R node 2. Wang needle biopsies from 11R node 3. Wang needle biopsies from 11L node 4. BAL from the RUL  Plans:  The patient will be discharged from the PACU to home when recovered from anesthesia. We will review the cytology and microbiology results with the patient when they become available. Outpatient followup will be with Dr Lamonte Sakai 06/24/17 at 14:30.    Baltazar Apo, MD, PhD 06/19/2017, 12:17 PM [facility] [phone] or if no answer [phone]

## 2017-06-19 NOTE — Anesthesia Preprocedure Evaluation (Addendum)
Anesthesia Evaluation  Patient identified by MRN, date of birth, ID band Patient awake    Reviewed: Allergy & Precautions, NPO status , Patient's Chart, lab work & pertinent test results  History of Anesthesia Complications Negative for: history of anesthetic complications  Airway Mallampati: I  TM Distance: >3 FB Neck ROM: Full    Dental  (+) Edentulous Upper, Edentulous Lower   Pulmonary sleep apnea and Continuous Positive Airway Pressure Ventilation , COPD,  Lung cancer   breath sounds clear to auscultation       Cardiovascular hypertension, + CAD, + Past MI and + Cardiac Stents (LAD stent)   Rhythm:Regular Rate:Normal  '16 ECHO: EF 65-70%, valves ok   Neuro/Psych PSYCHIATRIC DISORDERS (PTSD) Anxiety negative neurological ROS     GI/Hepatic Neg liver ROS, GERD  Medicated and Controlled,  Endo/Other  Hypothyroidism   Renal/GU negative Renal ROS     Musculoskeletal  (+) Arthritis , Osteoarthritis,    Abdominal   Peds  Hematology negative hematology ROS (+)   Anesthesia Other Findings Prostate cancer  Reproductive/Obstetrics                            Anesthesia Physical Anesthesia Plan  ASA: III  Anesthesia Plan: General   Post-op Pain Management:    Induction: Intravenous  PONV Risk Score and Plan: 2 and Ondansetron and Dexamethasone  Airway Management Planned: Oral ETT  Additional Equipment:   Intra-op Plan:   Post-operative Plan: Extubation in OR  Informed Consent: I have reviewed the patients History and Physical, chart, labs and discussed the procedure including the risks, benefits and alternatives for the proposed anesthesia with the patient or authorized representative who has indicated his/her understanding and acceptance.     Plan Discussed with: CRNA and Surgeon  Anesthesia Plan Comments: (Plan routine monitors, GETA)        Anesthesia Quick  Evaluation

## 2017-06-19 NOTE — Discharge Instructions (Signed)
Flexible Bronchoscopy, Care After These instructions give you information on caring for yourself after your procedure. Your doctor may also give you more specific instructions. Call your doctor if you have any problems or questions after your procedure. Follow these instructions at home:  Do not eat or drink anything for 2 hours after your procedure. If you try to eat or drink before the medicine wears off, food or drink could go into your lungs. You could also burn yourself.  After 2 hours have passed and when you can cough and gag normally, you may eat soft food and drink liquids slowly.  The day after the test, you may eat your normal diet.  You may do your normal activities.  Keep all doctor visits. Get help right away if:  You get more and more short of breath.  You get light-headed.  You feel like you are going to pass out (faint).  You have chest pain.  You have new problems that worry you.  You cough up more than a little blood.  You cough up more blood than before. This information is not intended to replace advice given to you by your health care provider. Make sure you discuss any questions you have with your health care provider. Document Released: 09/02/2009 Document Revised: 04/12/2016 Document Reviewed: 07/10/2013 Elsevier Interactive Patient Education  2017 [place].   Please call our office for any questions or concerns. [phone].

## 2017-06-19 NOTE — H&P (View-Only) (Signed)
Subjective:    Patient ID: Gregory Scott, male    DOB: 04-Jun-1941, 76 y.o.   MRN: [id number]  HPI 76 year old never smoker with a history of prostate cancer, also with pulmonary nodules that are been followed with serial CT scans of the chest. There has been a lower lobe ill-defined nodule that is increased in size prompting a PET CT scan on 05/30/17 that I have reviewed. This shows an ill-defined nodular right lower lobe lobulated lesion measures 27 x 11 mm creased in size compared with 2016 CT scan. There is very mild metabolic activity (SUV 1.8). Also noted is a 1 cm precarinal lymph node with mild hypermetabolic activity (SUV 3.4). He has never had prostatic spread, PSA has been normal.   Past medical history also significant for coronary disease, MI and stenting, arthritis, hypothyroidism, sleep apnea (good compliance CPAP). Recent back surgery that required general anesthesia.   No significant coughing. No fever, no night sweats. No known TB exposure. He has some exertional dyspnea. Still able to do his chores.    Review of Systems  Constitutional: Positive for unexpected weight change. Negative for fever.  HENT: Positive for congestion. Negative for dental problem, ear pain, nosebleeds, postnasal drip, rhinorrhea, sinus pressure, sneezing, sore throat and trouble swallowing.   Eyes: Negative for redness and itching.  Respiratory: Positive for cough and shortness of breath. Negative for chest tightness and wheezing.   Cardiovascular: Positive for palpitations. Negative for leg swelling.  Gastrointestinal: Negative for nausea and vomiting.  Genitourinary: Negative for dysuria.  Musculoskeletal: Negative for joint swelling.  Skin: Negative for rash.  Neurological: Negative for headaches.  Hematological: Does not bruise/bleed easily.  Psychiatric/Behavioral: Positive for dysphoric mood. The patient is not nervous/anxious.    Past Medical History:  Diagnosis Date  . Acute MI  anterior wall first episode care ([place]) 09/21/2015  . Anemia   . Arthritis    back  . Coronary artery disease   . Emphysema lung ([place])    [facility]  . GERD (gastroesophageal reflux disease)   . History of hiatal hernia   . History of kidney stones    2 lt kidney now  01/22/17  . Hypothyroidism   . Lung cancer ([place])    small part in the right lower   . Prostate cancer ([place])   . PTSD (post-traumatic stress disorder)   . Sleep apnea    sleep [facility]-   [facility]    [place]  [facility]     Family History  Problem Relation Age of Onset  . Coronary artery disease Mother 82     Social History   Social History  . Marital status: Married    Spouse name: N/A  . Number of children: N/A  . Years of education: N/A   Occupational History  . Not on file.   Social History Main Topics  . Smoking status: Never Smoker  . Smokeless tobacco: Never Used  . Alcohol use No  . Drug use: No  . Sexual activity: Not on file   Other Topics Concern  . 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  Worked in a foundry, exposed to [place], then for the [employer].   Allergies  Allergen Reactions  . Statins Other (See Comments)    MYALGIAS Legs hurt  in high doses     Outpatient Medications Prior to Visit  Medication Sig Dispense Refill  . Carboxymethylcellulose Sodium 1 % GEL Place 1 drop into both eyes at bedtime. Refresh Gel Drop    . ferrous sulfate 325 (65 FE) MG tablet Take 325 mg by mouth every Monday, Wednesday, and Friday. After lunch    . gabapentin (NEURONTIN) 300 MG capsule Take 1,200 mg by mouth at bedtime.     Marland Kitchen levothyroxine (SYNTHROID, LEVOTHROID) 50 MCG tablet Take 50 mcg by mouth daily before breakfast.    . nitroGLYCERIN (NITROSTAT) 0.4 MG SL tablet Place 1 tablet (0.4 mg total) under the tongue every 5 (five) minutes x 3 doses as needed for chest  pain. 25 tablet 0  . omeprazole (PRILOSEC) 20 MG capsule Take 20 mg by mouth See admin instructions. Take 1 capsule (20 MG) twice daily on Sunday, Tuesday, Thursday, & Saturday (with lunch & at bedtime)    . Polyvinyl Alcohol-Povidone (REFRESH OP) Place 1-2 drops into both eyes 4 (four) times daily.    Marland Kitchen PROVENTIL HFA 108 (90 BASE) MCG/ACT inhaler Take 1-2 puffs by mouth every 6 (six) hours as needed for wheezing or shortness of breath. Reported on 02/01/2016    . rosuvastatin (CRESTOR) 20 MG tablet Take 10 mg by mouth at bedtime.     No facility-administered medications prior to visit.         Objective:   Physical Exam Vitals:   06/14/17 0935 06/14/17 0936  BP:  (!) 146/82  Pulse:  60  SpO2:  99%  Weight: 156 lb (70.8 kg)   Height: 5\' 9"  (1.753 m)    Gen: Pleasant, thin man,  in no distress,  normal affect  ENT: No lesions,  mouth clear,  oropharynx clear, no postnasal drip  Neck: No JVD, no stridor  Lungs: No use of accessory muscles, distant but clear  Cardiovascular: RRR, heart sounds normal, no murmur or gallops, no peripheral edema  Musculoskeletal: No deformities, no cyanosis or clubbing  Neuro: alert, mild resting tremor, nonfocal, no deficits noted  Skin: Warm, no lesions or rash      Assessment & Plan:  Abnormal CT scan, chest Slowly enlarging irregularly shaped right lower lobe nodule, very mildly hypermetabolic on PET scan. 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 Nothing in the history to suggest tuberculosis  Also with a 1 cm precarinal node with some hypermetabolic activity. Again unclear whether this is reactive or clinically significant. I discussed with him the potential options for biopsy. In particular I recommended that he might be a candidate for resection by thoracic surgery, especially if his precarinal node is negative. He would like to be more conservative, avoid thoracic surgery at this time if possible.  I have recommended that he have bronchoscopy with EBUS to sample of note, if negative then he would need a transthoracic needle biopsy of the right lower lobe nodule. He understands the options. He wants to go home and discuss these with his wife and then let us know if and when he would like to proceed.  Dyspnea Some exertional shortness of breath. Changes on CT scan that are suggestive of possible emphysema. We will perform spirometry today to better assess, given an indication of his functional capacity prior to a possible procedure, general anesthesia.  Baltazar Apo, MD, PhD 06/14/2017, 10:12 AM [facility] [phone] or if no answer [phone]

## 2017-06-19 NOTE — Interval H&P Note (Signed)
PCCM Interval Note  Gregory Scott presents today for further eval of his abnormal CT chest, PET scan. He has a RLL poorly formed nodule that is increasing in size, also a precarinal node 1cm that is hypermetabolic. Never smoker. Does have a family hx of lung cancer. We have planned to assess the node w EBUS, then determine whether he needs a needle bx of RLL lesion depending on results. If the node gives a dx then the needle bx should be able to be deferred.   Pt has been clinically stable since our OV. No new issues. He has stable daily cough. Stable SOB. He understands the procedure, risks and benefits. He agree to proceed. I do not see any contraindications to proceeding.   Vitals:   06/19/17 0805  BP: (!) 151/88  Pulse: (!) 50  Resp: 20  Temp: 98.3 F (36.8 C)  TempSrc: Oral  SpO2: 98%   Gen: Pleasant, well-nourished, in no distress,  normal affect  ENT: No lesions,  mouth clear,  oropharynx clear, no postnasal drip  Neck: No JVD, no stridor  Lungs: No use of accessory muscles, distant, no wheeze  Cardiovascular: RRR, heart sounds normal, no murmur or gallops, no peripheral edema  Musculoskeletal: No deformities, no cyanosis or clubbing  Neuro: alert, non focal, mild resting tremor  Skin: Warm, no lesions or rashes   Recent Labs Lab 06/19/17 0750  HGB 14.0  HCT 40.7  WBC 5.0  PLT 195    Recent Labs Lab 06/19/17 0750  INR 1.05    Recent Labs Lab 06/19/17 0750  NA 136  K 3.8  CL 105  CO2 23  GLUCOSE 89  BUN 8  CREATININE 0.98  CALCIUM 9.2    Impression: Hypermetabolic pre-carinal node, unclear significance but with a coexisting RLL Nodule that raises concern for primary lung malignancy.   Plan:  EBUS and sampling pre-carinal node if reachable.   Baltazar Apo, MD, PhD 06/19/2017, 10:03 AM [facility] [phone] or if no answer [phone]

## 2017-06-20 ENCOUNTER — Encounter (HOSPITAL_COMMUNITY): Payer: Self-pay | Admitting: Emergency Medicine

## 2017-06-20 LAB — ACID FAST SMEAR (AFB): ACID FAST SMEAR - AFSCU2: NEGATIVE

## 2017-06-20 LAB — ACID FAST SMEAR (AFB, MYCOBACTERIA)

## 2017-06-21 ENCOUNTER — Telehealth: Payer: Self-pay | Admitting: Emergency Medicine

## 2017-06-21 LAB — CULTURE, RESPIRATORY: CULTURE: NO GROWTH

## 2017-06-21 LAB — CULTURE, RESPIRATORY W GRAM STAIN

## 2017-06-21 NOTE — Telephone Encounter (Signed)
I reviewed cytology with the patient by phone. Atypical cells from the pre-carinal node but no certain malignancy. Based on this I believe he will need a TTNA of the peripheral nodule vs possible referral for primary resection. Given the atypical cells I suspect TTNA would be the best option. Will discuss with him at [facility] on Monday

## 2017-06-24 ENCOUNTER — Ambulatory Visit (INDEPENDENT_AMBULATORY_CARE_PROVIDER_SITE_OTHER): Payer: Medicare Other | Admitting: Emergency Medicine

## 2017-06-24 ENCOUNTER — Encounter: Payer: Self-pay | Admitting: Emergency Medicine

## 2017-06-24 VITALS — BP 116/72 | HR 67 | Ht 69.0 in | Wt 153.0 lb

## 2017-06-24 DIAGNOSIS — R911 Solitary pulmonary nodule: Secondary | ICD-10-CM

## 2017-06-24 NOTE — Patient Instructions (Addendum)
I have recommended that you would benefit from a needle biopsy of your right lower lobe nodule to look for lung cancer.  We will plan to repeat your CT scan of the chest with IV contrast in 3 months to compare with priors. Depending on the results we will revisit the options for possible nodule biopsy.  Please call our office if you develop any new cough, chest pain or changes in your breathing.  Follow with Dr Lamonte Sakai in 3 months after the CT scan is done to review the results together.

## 2017-06-24 NOTE — Progress Notes (Signed)
Subjective:    Patient ID: Gregory Scott, male    DOB: 04/15/1941, 76 y.o.   MRN: [id number]  HPI 76 year old never smoker with a history of prostate cancer, also with pulmonary nodules that are been followed with serial CT scans of the chest. There has been a lower lobe ill-defined nodule that is increased in size prompting a PET CT scan on 05/30/17 that I have reviewed. This shows an ill-defined nodular right lower lobe lobulated lesion measures 27 x 11 mm creased in size compared with 2016 CT scan. There is very mild metabolic activity (SUV 1.8). Also noted is a 1 cm precarinal lymph node with mild hypermetabolic activity (SUV 3.4). He has never had prostatic spread, PSA has been normal.   Past medical history also significant for coronary disease, MI and stenting, arthritis, hypothyroidism, sleep apnea (good compliance CPAP). Recent back surgery that required general anesthesia.   No significant coughing. No fever, no night sweats. No known TB exposure. He has some exertional dyspnea. Still able to do his chores.   ROV 06/24/17 -- Patient returns for further evaluation of his abnormal CT scan of the chest, PET scan. He underwent endobronchial ultrasound on 06/19/17 and now is able to sample nodes at 4R, 11R, 11L. The 4R node showed atypical cells. He returns today to discuss results and plan our next steps. If his 4R node was positive and the right lower lobe nodule is primary lung cancer, then this would reflect IIIa disease. He could potentially be resectable. I believe we need to prove whether the peripheral nodule is malignant before committing him to procedure. He has done well since FOB. No more blood.    Review of Systems  Constitutional: Positive for unexpected weight change. Negative for fever.  HENT: Positive for congestion. Negative for dental problem, ear pain, nosebleeds, postnasal drip, rhinorrhea, sinus pressure, sneezing, sore throat and trouble swallowing.   Eyes: Negative  for redness and itching.  Respiratory: Positive for cough and shortness of breath. Negative for chest tightness and wheezing.   Cardiovascular: Positive for palpitations. Negative for leg swelling.  Gastrointestinal: Negative for nausea and vomiting.  Genitourinary: Negative for dysuria.  Musculoskeletal: Negative for joint swelling.  Skin: Negative for rash.  Neurological: Negative for headaches.  Hematological: Does not bruise/bleed easily.  Psychiatric/Behavioral: Positive for dysphoric mood. The patient is not nervous/anxious.        Objective:   Physical Exam Vitals:   06/24/17 1431  BP: 116/72  Pulse: 67  SpO2: 97%  Weight: 153 lb (69.4 kg)  Height: 5\' 9"  (1.753 m)   Gen: Pleasant, thin man,  in no distress,  normal affect  ENT: No lesions,  mouth clear,  oropharynx clear, no postnasal drip  Neck: No JVD, no stridor  Lungs: No use of accessory muscles, distant but clear  Cardiovascular: RRR, heart sounds normal, no murmur or gallops, no peripheral edema  Musculoskeletal: No deformities, no cyanosis or clubbing  Neuro: alert, mild resting tremor, nonfocal, no deficits noted  Skin: Warm, no lesions or rash      Assessment & Plan:  Pulmonary nodule, right Endobronchial ultrasound showed that the 4R node had atypical cells not definitively malignancy.   If his 4R node is actually positive, and the right lower lobe nodule is primary lung cancer, then this would reflect IIIa disease. He could potentially be resectable with adjuvant treatment. I believe we need to prove whether the peripheral nodule is malignant before committing him to procedure. I discussed  this with him in detail today. I have recommended that he go forward with a transthoracic needle biopsy of the right lower lobe nodule. He has thought about this and would like to defer, have a repeat CT scan in 3 months and then decide at that time. I explained to him that this was a reasonable strategy but that  there was some risk of metastasis during the time interval particularly since we have not fully ruled out involvement of his precarinal node. He understands. Will call me if he changes his mind about getting the bx now.   Baltazar Apo, MD, PhD 06/24/2017, 3:04 PM [facility] [phone] or if no answer [phone]

## 2017-06-24 NOTE — Assessment & Plan Note (Signed)
Endobronchial ultrasound showed that the 4R node had atypical cells not definitively malignancy.   If his 4R node is actually positive, and the right lower lobe nodule is primary lung cancer, then this would reflect IIIa disease. He could potentially be resectable with adjuvant treatment. I believe we need to prove whether the peripheral nodule is malignant before committing him to procedure. I discussed this with him in detail today. I have recommended that he go forward with a transthoracic needle biopsy of the right lower lobe nodule. He has thought about this and would like to defer, have a repeat CT scan in 3 months and then decide at that time. I explained to him that this was a reasonable strategy but that there was some risk of metastasis during the time interval particularly since we have not fully ruled out involvement of his precarinal node. He understands. Will call me if he changes his mind about getting the bx now.

## 2017-07-08 DIAGNOSIS — J449 Chronic obstructive pulmonary disease, unspecified: Secondary | ICD-10-CM | POA: Diagnosis not present

## 2017-07-08 DIAGNOSIS — I2510 Atherosclerotic heart disease of native coronary artery without angina pectoris: Secondary | ICD-10-CM | POA: Diagnosis not present

## 2017-07-08 DIAGNOSIS — I10 Essential (primary) hypertension: Secondary | ICD-10-CM | POA: Diagnosis not present

## 2017-07-08 DIAGNOSIS — M1990 Unspecified osteoarthritis, unspecified site: Secondary | ICD-10-CM | POA: Diagnosis not present

## 2017-07-08 DIAGNOSIS — F4310 Post-traumatic stress disorder, unspecified: Secondary | ICD-10-CM | POA: Diagnosis not present

## 2017-07-08 DIAGNOSIS — E039 Hypothyroidism, unspecified: Secondary | ICD-10-CM | POA: Diagnosis not present

## 2017-07-08 DIAGNOSIS — M5416 Radiculopathy, lumbar region: Secondary | ICD-10-CM | POA: Diagnosis not present

## 2017-07-08 DIAGNOSIS — I252 Old myocardial infarction: Secondary | ICD-10-CM | POA: Diagnosis not present

## 2017-07-08 DIAGNOSIS — E785 Hyperlipidemia, unspecified: Secondary | ICD-10-CM | POA: Diagnosis not present

## 2017-07-18 DIAGNOSIS — I10 Essential (primary) hypertension: Secondary | ICD-10-CM | POA: Diagnosis not present

## 2017-07-18 DIAGNOSIS — I252 Old myocardial infarction: Secondary | ICD-10-CM | POA: Diagnosis not present

## 2017-07-18 DIAGNOSIS — E039 Hypothyroidism, unspecified: Secondary | ICD-10-CM | POA: Diagnosis not present

## 2017-07-18 DIAGNOSIS — E785 Hyperlipidemia, unspecified: Secondary | ICD-10-CM | POA: Diagnosis not present

## 2017-07-18 DIAGNOSIS — M1990 Unspecified osteoarthritis, unspecified site: Secondary | ICD-10-CM | POA: Diagnosis not present

## 2017-07-18 DIAGNOSIS — R0789 Other chest pain: Secondary | ICD-10-CM | POA: Diagnosis not present

## 2017-07-18 DIAGNOSIS — I2510 Atherosclerotic heart disease of native coronary artery without angina pectoris: Secondary | ICD-10-CM | POA: Diagnosis not present

## 2017-07-19 LAB — FUNGUS CULTURE WITH STAIN

## 2017-07-19 LAB — FUNGUS CULTURE RESULT

## 2017-07-19 LAB — FUNGAL ORGANISM REFLEX

## 2017-08-02 LAB — ACID FAST CULTURE WITH REFLEXED SENSITIVITIES (MYCOBACTERIA)

## 2017-08-02 LAB — ACID FAST CULTURE WITH REFLEXED SENSITIVITIES: ACID FAST CULTURE - AFSCU3: NEGATIVE

## 2017-08-09 IMAGING — RF DG LUMBAR SPINE 2-3V
1 series · 2 of 2 positions shown · non-contrast
Comparison: Intraoperative radiographs from the same day.

CLINICAL DATA: PLIF and ALIF of L5-S1.

EXAM:
LUMBAR SPINE - 2-3 VIEW

[Series 1: run · 2 of 2 slices shown]
[im 1/2]
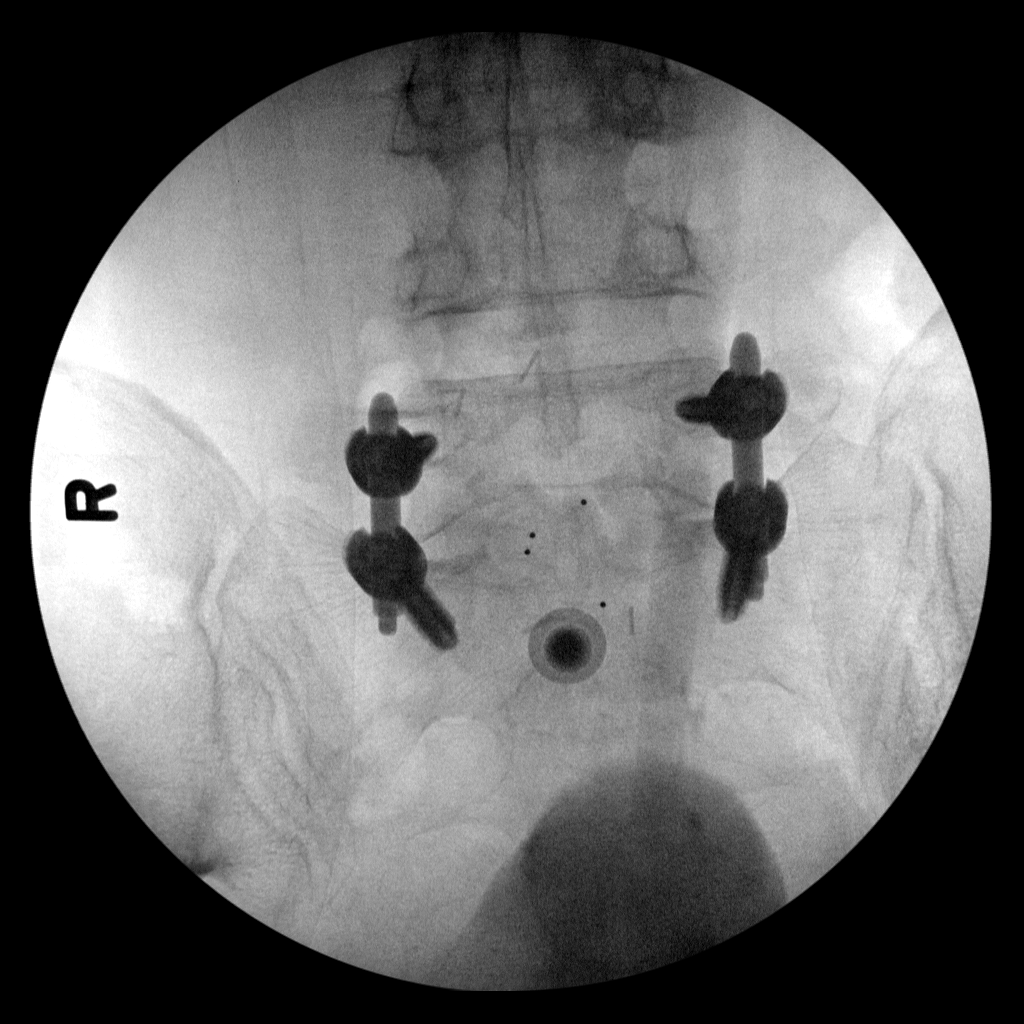
[im 2/2]
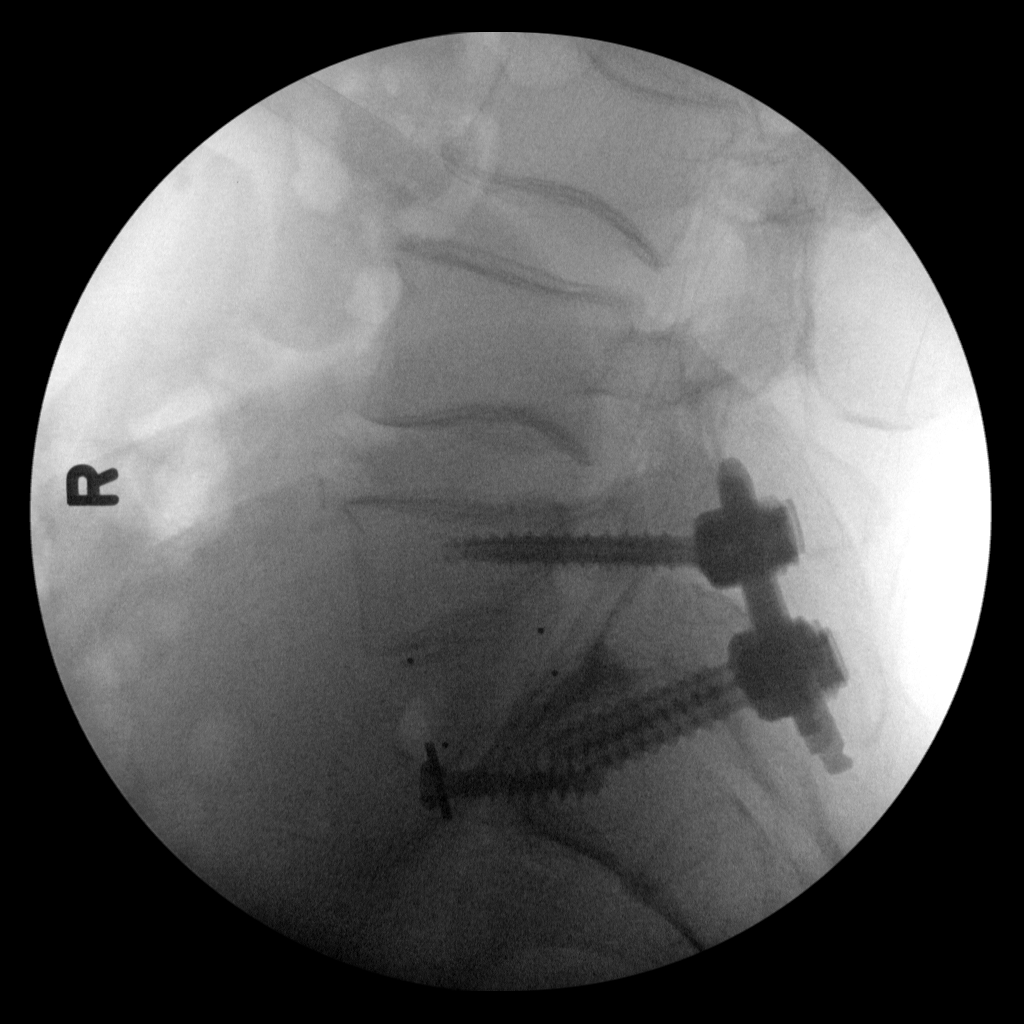

[2 of 2 positions shown; findings below may reference images not displayed]

FINDINGS: Following anterior interbody fusion, posterior pedicle screw and rod
fixation is now present at L5-S1. The hardware is intact.
IMPRESSION: Status post anterior and posterior fusion at L5-S1 without
radiographic evidence for complication.

## 2017-08-16 DIAGNOSIS — C61 Malignant neoplasm of prostate: Secondary | ICD-10-CM | POA: Diagnosis not present

## 2017-09-06 IMAGING — CR DG ORBITS FOR FOREIGN BODY
2 series · 2 of 2 positions shown · non-contrast
Comparison: None.

CLINICAL DATA: Metal working/exposure; clearance prior to MRI

EXAM:
ORBITS FOR FOREIGN BODY - 2 VIEW

[w orbit pa (1 of 2)]
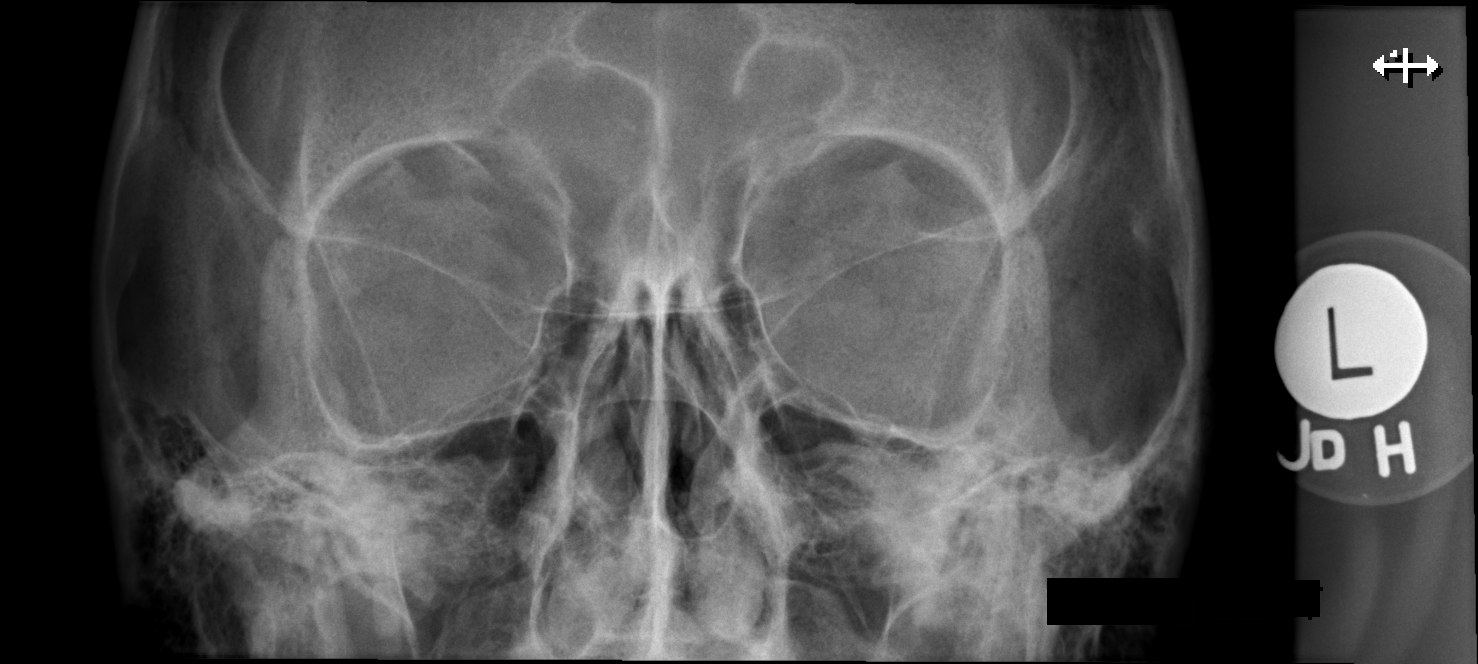

[w orbit pa (2 of 2)]
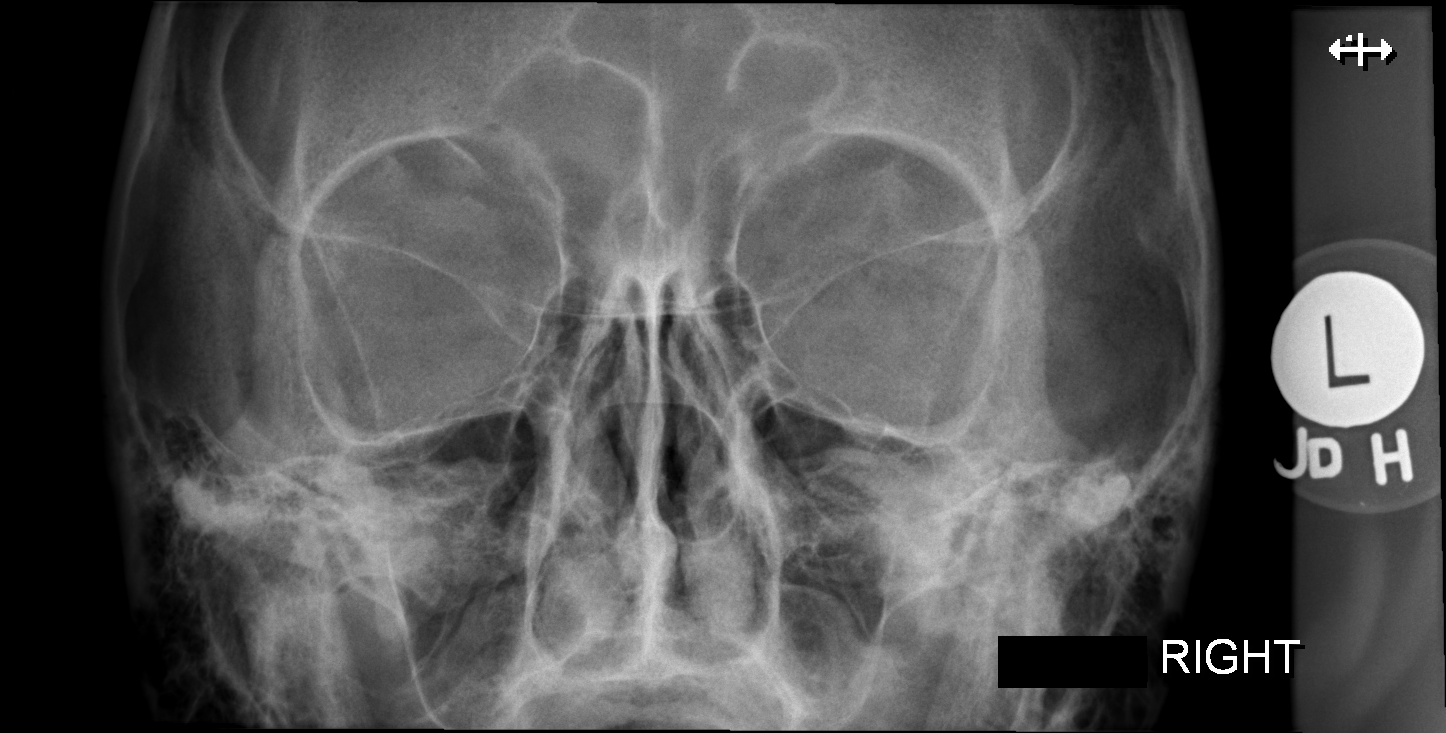

[2 of 2 positions shown; findings below may reference images not displayed]

FINDINGS: Water's views with eyes deviated toward the left and toward the
right were obtained. There is no intraorbital radiopaque foreign
body. No fracture or dislocation. The visualized paranasal sinuses
are clear.
IMPRESSION: No evidence of metallic foreign body within the orbits.

## 2017-09-17 ENCOUNTER — Other Ambulatory Visit (INDEPENDENT_AMBULATORY_CARE_PROVIDER_SITE_OTHER): Payer: Medicare Other

## 2017-09-17 DIAGNOSIS — R911 Solitary pulmonary nodule: Secondary | ICD-10-CM | POA: Diagnosis not present

## 2017-09-17 LAB — BASIC METABOLIC PANEL
BUN: 11 mg/dL (ref 6–23)
CHLORIDE: 103 meq/L (ref 96–112)
CO2: 26 mEq/L (ref 19–32)
CREATININE: 1.06 mg/dL (ref 0.40–1.50)
Calcium: 9.6 mg/dL (ref 8.4–10.5)
GFR: 71.93 mL/min (ref >60.00)
Glucose, Bld: 98 mg/dL (ref 70–99)
Potassium: 4 mEq/L (ref 3.5–5.1)
Sodium: 138 mEq/L (ref 135–145)

## 2017-09-24 ENCOUNTER — Other Ambulatory Visit: Payer: Self-pay | Admitting: Emergency Medicine

## 2017-09-24 ENCOUNTER — Ambulatory Visit
Admission: RE | Admit: 2017-09-24 | Discharge: 2017-09-24 | Disposition: A | Discharge status: Home / Self Care | Payer: Medicare Other | Source: Ambulatory Visit | Attending: Emergency Medicine | Admitting: Emergency Medicine

## 2017-09-24 ENCOUNTER — Ambulatory Visit (INDEPENDENT_AMBULATORY_CARE_PROVIDER_SITE_OTHER)
Admission: RE | Admit: 2017-09-24 | Discharge: 2017-09-24 | Disposition: A | Discharge status: Home / Self Care | Payer: Medicare Other | Source: Ambulatory Visit | Attending: Emergency Medicine | Admitting: Emergency Medicine

## 2017-09-24 DIAGNOSIS — R918 Other nonspecific abnormal finding of lung field: Secondary | ICD-10-CM | POA: Diagnosis not present

## 2017-09-24 DIAGNOSIS — R911 Solitary pulmonary nodule: Secondary | ICD-10-CM

## 2017-09-24 MED ORDER — IOPAMIDOL (ISOVUE-300) INJECTION 61%
80.0000 mL | Freq: Once | INTRAVENOUS | Status: AC | PRN
  Administered 2017-09-24: 80 mL via INTRAVENOUS

## 2017-10-01 ENCOUNTER — Encounter: Payer: Self-pay | Admitting: Emergency Medicine

## 2017-10-01 ENCOUNTER — Ambulatory Visit (INDEPENDENT_AMBULATORY_CARE_PROVIDER_SITE_OTHER): Payer: Medicare Other | Admitting: Emergency Medicine

## 2017-10-01 DIAGNOSIS — R911 Solitary pulmonary nodule: Secondary | ICD-10-CM | POA: Diagnosis not present

## 2017-10-01 NOTE — Assessment & Plan Note (Signed)
Irregularly-shaped right lower lobe opacity with some mediastinal lymphadenopathy.  EBUS was reassuring.  The opacity is unchanged on CT scan from 09/24/17.  At this point after discussing the options we have decided that we will follow with serial films.  Next CT scan May 2019.  He will get the flu shot at the [place] this year

## 2017-10-01 NOTE — Patient Instructions (Signed)
We will repeat your CT scan in May 2019 to follow your pulmonary nodule.  Get the flu shot at the [place] as planned.  Follow with Dr Lamonte Sakai in 6 months to review the scan results.

## 2017-10-01 NOTE — Progress Notes (Signed)
Subjective:    Patient ID: Gregory Scott, male    DOB: [birth year], 76 y.o.   MRN: [id number]  HPI 76 year old never smoker with a history of prostate cancer, also with pulmonary nodules that are been followed with serial CT scans of the chest. There has been a lower lobe ill-defined nodule that is increased in size prompting a PET CT scan on 05/30/17 that I have reviewed. This shows an ill-defined nodular right lower lobe lobulated lesion measures 27 x 11 mm creased in size compared with 2016 CT scan. There is very mild metabolic activity (SUV 1.8). Also noted is a 1 cm precarinal lymph node with mild hypermetabolic activity (SUV 3.4). He has never had prostatic spread, PSA has been normal.   Past medical history also significant for coronary disease, MI and stenting, arthritis, hypothyroidism, sleep apnea (good compliance CPAP). Recent back surgery that required general anesthesia.   No significant coughing. No fever, no night sweats. No known TB exposure. He has some exertional dyspnea. Still able to do his chores.   ROV 06/24/17 -- Patient returns for further evaluation of his abnormal CT scan of the chest, PET scan. He underwent endobronchial ultrasound on 06/19/17 and now is able to sample nodes at 4R, 11R, 11L. The 4R node showed atypical cells. He returns today to discuss results and plan our next steps. If his 4R node was positive and the right lower lobe nodule is primary lung cancer, then this would reflect IIIa disease. He could potentially be resectable. I believe we need to prove whether the peripheral nodule is malignant before committing him to procedure. He has done well since FOB. No more blood.   ROV 10/01/17 --this is a follow-up visit for abnormal CT scan of the chest and PET scan.  Interbronchial ultrasound 8/1 allow sampling of nodal disease, showed atypical cells at 4R.  Based on that result we decided to repeat a CT scan of the chest this was done on 09/24/17 and showed no  interval change in his right lower lobe groundglass opacity. We discussed results in detail today. No new sx. He feels well, has a good functional capacity. He is planning to get flu shot at the [place].    Review of Systems  Constitutional: Positive for unexpected weight change. Negative for fever.  HENT: Positive for congestion. Negative for dental problem, ear pain, nosebleeds, postnasal drip, rhinorrhea, sinus pressure, sneezing, sore throat and trouble swallowing.   Eyes: Negative for redness and itching.  Respiratory: Positive for cough and shortness of breath. Negative for chest tightness and wheezing.   Cardiovascular: Positive for palpitations. Negative for leg swelling.  Gastrointestinal: Negative for nausea and vomiting.  Genitourinary: Negative for dysuria.  Musculoskeletal: Negative for joint swelling.  Skin: Negative for rash.  Neurological: Negative for headaches.  Hematological: Does not bruise/bleed easily.  Psychiatric/Behavioral: Positive for dysphoric mood. The patient is not nervous/anxious.        Objective:   Physical Exam Vitals:   10/01/17 0925  BP: (!) 134/58  Pulse: 66  SpO2: 97%  Weight: 156 lb (70.8 kg)  Height: 5\' 9"  (1.753 m)   Gen: Pleasant, thin man,  in no distress,  normal affect  ENT: No lesions,  mouth clear,  oropharynx clear, no postnasal drip  Neck: No JVD, no stridor  Lungs: No use of accessory muscles, distant but clear  Cardiovascular: RRR, heart sounds normal, no murmur or gallops, no peripheral edema  Musculoskeletal: No deformities, no cyanosis or clubbing  Neuro: alert, mild resting tremor, nonfocal, no deficits noted  Skin: Warm, no lesions or rash      Assessment & Plan:  Pulmonary nodule, right Irregularly-shaped right lower lobe opacity with some mediastinal lymphadenopathy.  EBUS was reassuring.  The opacity is unchanged on CT scan from 09/24/17.  At this point after discussing the options we have decided that we will  follow with serial films.  Next CT scan May 2019.  He will get the flu shot at the [place] this year  Baltazar Apo, MD, PhD 10/01/2017, 9:40 AM [facility] [phone] or if no answer [phone]

## 2017-10-21 DIAGNOSIS — E039 Hypothyroidism, unspecified: Secondary | ICD-10-CM | POA: Diagnosis not present

## 2017-10-21 DIAGNOSIS — E785 Hyperlipidemia, unspecified: Secondary | ICD-10-CM | POA: Diagnosis not present

## 2017-10-21 DIAGNOSIS — I2510 Atherosclerotic heart disease of native coronary artery without angina pectoris: Secondary | ICD-10-CM | POA: Diagnosis not present

## 2017-10-21 DIAGNOSIS — I252 Old myocardial infarction: Secondary | ICD-10-CM | POA: Diagnosis not present

## 2017-10-21 DIAGNOSIS — I10 Essential (primary) hypertension: Secondary | ICD-10-CM | POA: Diagnosis not present

## 2017-10-21 DIAGNOSIS — J449 Chronic obstructive pulmonary disease, unspecified: Secondary | ICD-10-CM | POA: Diagnosis not present

## 2017-10-21 DIAGNOSIS — M1990 Unspecified osteoarthritis, unspecified site: Secondary | ICD-10-CM | POA: Diagnosis not present

## 2017-10-31 DIAGNOSIS — M545 Low back pain: Secondary | ICD-10-CM | POA: Diagnosis not present

## 2017-11-15 DIAGNOSIS — C61 Malignant neoplasm of prostate: Secondary | ICD-10-CM | POA: Diagnosis not present

## 2017-11-28 DIAGNOSIS — J0100 Acute maxillary sinusitis, unspecified: Secondary | ICD-10-CM | POA: Diagnosis not present

## 2017-12-10 DIAGNOSIS — J00 Acute nasopharyngitis [common cold]: Secondary | ICD-10-CM | POA: Diagnosis not present

## 2018-01-07 DIAGNOSIS — J069 Acute upper respiratory infection, unspecified: Secondary | ICD-10-CM | POA: Diagnosis not present

## 2018-01-07 DIAGNOSIS — R05 Cough: Secondary | ICD-10-CM | POA: Diagnosis not present

## 2018-01-20 DIAGNOSIS — E785 Hyperlipidemia, unspecified: Secondary | ICD-10-CM | POA: Diagnosis not present

## 2018-01-20 DIAGNOSIS — I10 Essential (primary) hypertension: Secondary | ICD-10-CM | POA: Diagnosis not present

## 2018-01-20 DIAGNOSIS — I252 Old myocardial infarction: Secondary | ICD-10-CM | POA: Diagnosis not present

## 2018-01-20 DIAGNOSIS — E039 Hypothyroidism, unspecified: Secondary | ICD-10-CM | POA: Diagnosis not present

## 2018-01-20 DIAGNOSIS — I2510 Atherosclerotic heart disease of native coronary artery without angina pectoris: Secondary | ICD-10-CM | POA: Diagnosis not present

## 2018-01-20 DIAGNOSIS — J449 Chronic obstructive pulmonary disease, unspecified: Secondary | ICD-10-CM | POA: Diagnosis not present

## 2018-02-14 DIAGNOSIS — C61 Malignant neoplasm of prostate: Secondary | ICD-10-CM | POA: Diagnosis not present

## 2018-02-14 DIAGNOSIS — N200 Calculus of kidney: Secondary | ICD-10-CM | POA: Diagnosis not present

## 2018-03-20 ENCOUNTER — Ambulatory Visit (INDEPENDENT_AMBULATORY_CARE_PROVIDER_SITE_OTHER)
Admission: RE | Admit: 2018-03-20 | Discharge: 2018-03-20 | Disposition: A | Discharge status: Home / Self Care | Payer: Medicare Other | Source: Ambulatory Visit | Attending: Emergency Medicine | Admitting: Emergency Medicine

## 2018-03-20 DIAGNOSIS — R911 Solitary pulmonary nodule: Secondary | ICD-10-CM | POA: Diagnosis not present

## 2018-04-18 ENCOUNTER — Ambulatory Visit (INDEPENDENT_AMBULATORY_CARE_PROVIDER_SITE_OTHER): Payer: Medicare Other | Admitting: Emergency Medicine

## 2018-04-18 ENCOUNTER — Encounter: Payer: Self-pay | Admitting: Emergency Medicine

## 2018-04-18 VITALS — BP 132/86 | HR 57 | Ht 69.0 in | Wt 155.0 lb

## 2018-04-18 DIAGNOSIS — R0600 Dyspnea, unspecified: Secondary | ICD-10-CM

## 2018-04-18 DIAGNOSIS — R9389 Abnormal findings on diagnostic imaging of other specified body structures: Secondary | ICD-10-CM

## 2018-04-18 DIAGNOSIS — R918 Other nonspecific abnormal finding of lung field: Secondary | ICD-10-CM | POA: Diagnosis not present

## 2018-04-18 NOTE — Patient Instructions (Addendum)
We will perform a PET scan  We will plan to perform a repeat bronchoscopy with lymph node and right lower lobe sampling.  We will repeat your CT chest prior to your procedure. We will perform full pulmonary function testing Follow with Dr Lamonte Sakai in 1 month or next available.

## 2018-04-18 NOTE — Progress Notes (Signed)
Subjective:    Patient ID: Gregory Scott, male    DOB: [birth year], 77 y.o.   MRN: [id number]  HPI 77 year old never smoker with a history of prostate cancer, also with pulmonary nodules that are been followed with serial CT scans of the chest. There has been a lower lobe ill-defined nodule that is increased in size prompting a PET CT scan on 05/30/17 that I have reviewed. This shows an ill-defined nodular right lower lobe lobulated lesion measures 27 x 11 mm creased in size compared with 2016 CT scan. There is very mild metabolic activity (SUV 1.8). Also noted is a 1 cm precarinal lymph node with mild hypermetabolic activity (SUV 3.4). He has never had prostatic spread, PSA has been normal.   Past medical history also significant for coronary disease, MI and stenting, arthritis, hypothyroidism, sleep apnea (good compliance CPAP). Recent back surgery that required general anesthesia.   No significant coughing. No fever, no night sweats. No known TB exposure. He has some exertional dyspnea. Still able to do his chores.   ROV 06/24/17 -- Patient returns for further evaluation of his abnormal CT scan of the chest, PET scan. He underwent endobronchial ultrasound on 06/19/17, able to sample nodes at 4R, 11R, 11L. The 4R node showed atypical cells. He returns today to discuss results and plan our next steps. If his 4R node was positive and the right lower lobe nodule is primary lung cancer, then this would reflect IIIa disease. He could potentially be resectable. I believe we need to prove whether the peripheral nodule is malignant before committing him to procedure. He has done well since FOB. No more blood.   ROV 10/01/17 --this is a follow-up visit for abnormal CT scan of the chest and PET scan.  Endobronchial ultrasound 8/1 allow sampling of nodal disease, showed atypical cells at 4R.  Based on that result we decided to repeat a CT scan of the chest this was done on 09/24/17 and showed no interval change  in his right lower lobe groundglass opacity. We discussed results in detail today. No new sx. He feels well, has a good functional capacity. He is planning to get flu shot at the [facility] 04/18/18 --77 year old never smoker follows up today to discuss his CT scan of the chest.  We have been following mediastinal lymphadenopathy and a right lower lobe groundglass opacity.  He underwent endobronchial ultrasound in August 2018 that showed atypical cells at 4R but otherwise was negative.  His repeat CT scan was done on 03/20/2018 and I have reviewed.  This shows an increase in the size of his right lower lobe opacity compared with 09/24/2017. His R pretracheal node is also probably slightly larger.  He complains of frequent cough.  His functional capacity remains good and he denies any significant dyspnea.  He has a silica exposure, no smoking history.   Review of Systems  Constitutional: Positive for unexpected weight change. Negative for fever.  HENT: Positive for congestion. Negative for dental problem, ear pain, nosebleeds, postnasal drip, rhinorrhea, sinus pressure, sneezing, sore throat and trouble swallowing.   Eyes: Negative for redness and itching.  Respiratory: Positive for cough and shortness of breath. Negative for chest tightness and wheezing.   Cardiovascular: Positive for palpitations. Negative for leg swelling.  Gastrointestinal: Negative for nausea and vomiting.  Genitourinary: Negative for dysuria.  Musculoskeletal: Negative for joint swelling.  Skin: Negative for rash.  Neurological: Negative for headaches.  Hematological: Does not bruise/bleed easily.  Psychiatric/Behavioral:  Positive for dysphoric mood. The patient is not nervous/anxious.        Objective:   Physical Exam Vitals:   04/18/18 0938  BP: 132/86  Pulse: (!) 57  SpO2: 100%  Weight: 155 lb (70.3 kg)  Height: 5\' 9"  (1.753 m)   Gen: Pleasant, thin man,  in no distress,  normal affect  ENT: No lesions,  mouth  clear,  oropharynx clear, no postnasal drip  Neck: No JVD, no stridor  Lungs: No use of accessory muscles, distant but clear  Cardiovascular: RRR, heart sounds normal, no murmur or gallops, no peripheral edema  Musculoskeletal: No deformities, no cyanosis or clubbing  Neuro: alert, mild resting tremor, nonfocal, no deficits noted  Skin: Warm, no lesions or rash      Assessment & Plan:  Abnormal CT scan, chest Interval increase in size in his right lower lobe opacity, also probably an increase in size in his right pretracheal node, the other nodes are still present, stable.  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  I considered a possible surgical referral but given the mediastinal lymphadenopathy still not sure  off his stage.  After discussion we decided to try to obtain tissue diagnosis, staging his mediastinum.  I will order PET scan, plan for an endobronchial ultrasound and navigational bronchoscopy after.  I will perform pulmonary function testing to assess his degree of obstruction and procedural risk.  Baltazar Apo, MD, PhD 04/18/2018, 10:12 AM  Pulmonary and Critical Care [phone] or if no answer [phone]

## 2018-04-18 NOTE — H&P (View-Only) (Signed)
Subjective:    Patient ID: Gregory Scott, male    DOB: 1941/05/01, 77 y.o.   MRN: [id number]  HPI 77 year old never smoker with a history of prostate cancer, also with pulmonary nodules that are been followed with serial CT scans of the chest. There has been a lower lobe ill-defined nodule that is increased in size prompting a PET CT scan on 05/30/17 that I have reviewed. This shows an ill-defined nodular right lower lobe lobulated lesion measures 27 x 11 mm creased in size compared with 2016 CT scan. There is very mild metabolic activity (SUV 1.8). Also noted is a 1 cm precarinal lymph node with mild hypermetabolic activity (SUV 3.4). He has never had prostatic spread, PSA has been normal.   Past medical history also significant for coronary disease, MI and stenting, arthritis, hypothyroidism, sleep apnea (good compliance CPAP). Recent back surgery that required general anesthesia.   No significant coughing. No fever, no night sweats. No known TB exposure. He has some exertional dyspnea. Still able to do his chores.   ROV 06/24/17 -- Patient returns for further evaluation of his abnormal CT scan of the chest, PET scan. He underwent endobronchial ultrasound on 06/19/17, able to sample nodes at 4R, 11R, 11L. The 4R node showed atypical cells. He returns today to discuss results and plan our next steps. If his 4R node was positive and the right lower lobe nodule is primary lung cancer, then this would reflect IIIa disease. He could potentially be resectable. I believe we need to prove whether the peripheral nodule is malignant before committing him to procedure. He has done well since FOB. No more blood.   ROV 10/01/17 --this is a follow-up visit for abnormal CT scan of the chest and PET scan.  Endobronchial ultrasound 8/1 allow sampling of nodal disease, showed atypical cells at 4R.  Based on that result we decided to repeat a CT scan of the chest this was done on 09/24/17 and showed no interval change  in his right lower lobe groundglass opacity. We discussed results in detail today. No new sx. He feels well, has a good functional capacity. He is planning to get flu shot at the [facility] 04/18/18 --77 year old never smoker follows up today to discuss his CT scan of the chest.  We have been following mediastinal lymphadenopathy and a right lower lobe groundglass opacity.  He underwent endobronchial ultrasound in August 2018 that showed atypical cells at 4R but otherwise was negative.  His repeat CT scan was done on 03/20/2018 and I have reviewed.  This shows an increase in the size of his right lower lobe opacity compared with 09/24/2017. His R pretracheal node is also probably slightly larger.  He complains of frequent cough.  His functional capacity remains good and he denies any significant dyspnea.  He has a silica exposure, no smoking history.   Review of Systems  Constitutional: Positive for unexpected weight change. Negative for fever.  HENT: Positive for congestion. Negative for dental problem, ear pain, nosebleeds, postnasal drip, rhinorrhea, sinus pressure, sneezing, sore throat and trouble swallowing.   Eyes: Negative for redness and itching.  Respiratory: Positive for cough and shortness of breath. Negative for chest tightness and wheezing.   Cardiovascular: Positive for palpitations. Negative for leg swelling.  Gastrointestinal: Negative for nausea and vomiting.  Genitourinary: Negative for dysuria.  Musculoskeletal: Negative for joint swelling.  Skin: Negative for rash.  Neurological: Negative for headaches.  Hematological: Does not bruise/bleed easily.  Psychiatric/Behavioral:  Positive for dysphoric mood. The patient is not nervous/anxious.        Objective:   Physical Exam Vitals:   04/18/18 0938  BP: 132/86  Pulse: (!) 57  SpO2: 100%  Weight: 155 lb (70.3 kg)  Height: 5\' 9"  (1.753 m)   Gen: Pleasant, thin man,  in no distress,  normal affect  ENT: No lesions,  mouth  clear,  oropharynx clear, no postnasal drip  Neck: No JVD, no stridor  Lungs: No use of accessory muscles, distant but clear  Cardiovascular: RRR, heart sounds normal, no murmur or gallops, no peripheral edema  Musculoskeletal: No deformities, no cyanosis or clubbing  Neuro: alert, mild resting tremor, nonfocal, no deficits noted  Skin: Warm, no lesions or rash      Assessment & Plan:  Abnormal CT scan, chest Interval increase in size in his right lower lobe opacity, also probably an increase in size in his right pretracheal node, the other nodes are still present, stable.  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  I considered a possible surgical referral but given the mediastinal lymphadenopathy still not sure  off his stage.  After discussion we decided to try to obtain tissue diagnosis, staging his mediastinum.  I will order PET scan, plan for an endobronchial ultrasound and navigational bronchoscopy after.  I will perform pulmonary function testing to assess his degree of obstruction and procedural risk.  Baltazar Apo, MD, PhD 04/18/2018, 10:12 AM [place] [facility] [phone] or if no answer [phone]

## 2018-04-18 NOTE — Assessment & Plan Note (Signed)
Interval increase in size in his right lower lobe opacity, also probably an increase in size in his right pretracheal node, the other nodes are still present, stable.  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  I considered a possible surgical referral but given the mediastinal lymphadenopathy still not sure  off his stage.  After discussion we decided to try to obtain tissue diagnosis, staging his mediastinum.  I will order PET scan, plan for an endobronchial ultrasound and navigational bronchoscopy after.  I will perform pulmonary function testing to assess his degree of obstruction and procedural risk.

## 2018-04-21 ENCOUNTER — Ambulatory Visit (INDEPENDENT_AMBULATORY_CARE_PROVIDER_SITE_OTHER)
Admission: RE | Admit: 2018-04-21 | Discharge: 2018-04-21 | Disposition: A | Discharge status: Home / Self Care | Payer: Medicare Other | Source: Ambulatory Visit | Attending: Emergency Medicine | Admitting: Emergency Medicine

## 2018-04-21 DIAGNOSIS — R918 Other nonspecific abnormal finding of lung field: Secondary | ICD-10-CM | POA: Diagnosis not present

## 2018-04-21 DIAGNOSIS — E039 Hypothyroidism, unspecified: Secondary | ICD-10-CM | POA: Diagnosis not present

## 2018-04-21 DIAGNOSIS — I10 Essential (primary) hypertension: Secondary | ICD-10-CM | POA: Diagnosis not present

## 2018-04-21 DIAGNOSIS — E785 Hyperlipidemia, unspecified: Secondary | ICD-10-CM | POA: Diagnosis not present

## 2018-04-21 DIAGNOSIS — I252 Old myocardial infarction: Secondary | ICD-10-CM | POA: Diagnosis not present

## 2018-04-21 DIAGNOSIS — J449 Chronic obstructive pulmonary disease, unspecified: Secondary | ICD-10-CM | POA: Diagnosis not present

## 2018-04-21 DIAGNOSIS — J8410 Pulmonary fibrosis, unspecified: Secondary | ICD-10-CM | POA: Diagnosis not present

## 2018-04-21 DIAGNOSIS — J984 Other disorders of lung: Secondary | ICD-10-CM | POA: Diagnosis not present

## 2018-04-21 DIAGNOSIS — I2510 Atherosclerotic heart disease of native coronary artery without angina pectoris: Secondary | ICD-10-CM | POA: Diagnosis not present

## 2018-04-23 ENCOUNTER — Encounter (HOSPITAL_COMMUNITY)
Admission: RE | Admit: 2018-04-23 | Discharge: 2018-04-23 | Disposition: A | Discharge status: Home / Self Care | Payer: Medicare Other | Source: Ambulatory Visit | Attending: Emergency Medicine | Admitting: Emergency Medicine

## 2018-04-23 DIAGNOSIS — R918 Other nonspecific abnormal finding of lung field: Secondary | ICD-10-CM | POA: Insufficient documentation

## 2018-04-23 DIAGNOSIS — R911 Solitary pulmonary nodule: Secondary | ICD-10-CM | POA: Diagnosis not present

## 2018-04-23 LAB — GLUCOSE, CAPILLARY: Glucose-Capillary: 90 mg/dL (ref 65–99)

## 2018-04-23 MED ORDER — FLUDEOXYGLUCOSE F - 18 (FDG) INJECTION
7.8400 | Freq: Once | INTRAVENOUS | Status: AC | PRN
  Administered 2018-04-23: 7.84 via INTRAVENOUS

## 2018-04-24 ENCOUNTER — Telehealth: Payer: Self-pay | Admitting: Emergency Medicine

## 2018-04-24 NOTE — Telephone Encounter (Signed)
Spoke with patient. Advised him that I did not see where anyone from our office had called him. He verbalized understanding. Nothing else needed at time of call.

## 2018-04-25 NOTE — Pre-Procedure Instructions (Signed)
Gregory Scott  04/25/2018      [place] ([place]), [place] [address] [place] ([place]) [place] [address] Phone: [phone] Fax: [phone]    Your procedure is scheduled on June 12.  Report to [facility] Admitting at 6:30 A.M.  Call this number if you have problems the morning of surgery:  [phone]   Remember:  No food or liquids after midnight.                       Take these medicines the morning of surgery with A SIP OF WATER : Tylenol #3 if needed, levothyroxine (synthroid),omeprazole (prilosec), eye drops, proventil inhaler if needed-bring to hospital               7 days prior to surgery STOP taking any Aspirin(unless otherwise instructed by your surgeon), Aleve, Naproxen, Ibuprofen, Motrin, Advil, Goody's, BC's, all herbal medications, fish oil, and all vitamins    Do not wear jewelry.  Do not wear lotions, powders, or perfumes, or deodorant.  Do not shave 48 hours prior to surgery.  Men may shave face and neck.  Do not bring valuables to the hospital.  [facility] is not responsible for any belongings or valuables.  Contacts, dentures or bridgework may not be worn into surgery.  Leave your suitcase in the car.  After surgery it may be brought to your room.  For patients admitted to the hospital, discharge time will be determined by your treatment team.  Patients discharged the day of surgery will not be allowed to drive home.    Special instructions:   [place]- Preparing For Surgery  Before surgery, you can play an important role. Because skin is not sterile, your skin needs to be as free of germs as possible. You can reduce the number of germs on your skin by washing with CHG (chlorahexidine gluconate) Soap before surgery.  CHG is an antiseptic cleaner which kills germs and bonds with the skin to continue killing germs even after washing.    Oral Hygiene is also important to reduce your risk of  infection.  Remember - BRUSH YOUR TEETH THE MORNING OF SURGERY WITH YOUR REGULAR TOOTHPASTE  Please do not use if you have an allergy to CHG or antibacterial soaps. If your skin becomes reddened/irritated stop using the CHG.  Do not shave (including legs and underarms) for at least 48 hours prior to first CHG shower. It is OK to shave your face.  Please follow these instructions carefully.   1. Shower the NIGHT BEFORE SURGERY and the MORNING OF SURGERY with CHG.   2. If you chose to wash your hair, wash your hair first as usual with your normal shampoo.  3. After you shampoo, rinse your hair and body thoroughly to remove the shampoo.  4. Use CHG as you would any other liquid soap. You can apply CHG directly to the skin and wash gently with a scrungie or a clean washcloth.   5. Apply the CHG Soap to your body ONLY FROM THE NECK DOWN.  Do not use on open wounds or open sores. Avoid contact with your eyes, ears, mouth and genitals (private parts). Wash Face and genitals (private parts)  with your normal soap.  6. Wash thoroughly, paying special attention to the area where your surgery will be performed.  7. Thoroughly rinse your body with warm water from the neck down.  8. DO NOT shower/wash with your normal soap after using and rinsing off the CHG Soap.  9. Pat yourself dry with a CLEAN TOWEL.  10. Wear CLEAN PAJAMAS to bed the night before surgery, wear comfortable clothes the morning of surgery  11. Place CLEAN SHEETS on your bed the night of your first shower and DO NOT SLEEP WITH PETS.    Day of Surgery:  Do not apply any deodorants/lotions.  Please wear clean clothes to the hospital/surgery center.   Remember to brush your teeth WITH YOUR REGULAR TOOTHPASTE.    Please read over the following fact sheets that you were given. Coughing and Deep Breathing and Surgical Site Infection Prevention

## 2018-04-28 ENCOUNTER — Encounter (HOSPITAL_COMMUNITY)
Admission: RE | Admit: 2018-04-28 | Discharge: 2018-04-28 | Disposition: A | Discharge status: Home / Self Care | Payer: Medicare Other | Source: Ambulatory Visit | Attending: Emergency Medicine | Admitting: Emergency Medicine

## 2018-04-28 ENCOUNTER — Telehealth: Payer: Self-pay | Admitting: Emergency Medicine

## 2018-04-28 ENCOUNTER — Other Ambulatory Visit: Payer: Self-pay

## 2018-04-28 ENCOUNTER — Encounter (HOSPITAL_COMMUNITY): Payer: Self-pay

## 2018-04-28 DIAGNOSIS — F419 Anxiety disorder, unspecified: Secondary | ICD-10-CM | POA: Diagnosis not present

## 2018-04-28 DIAGNOSIS — J449 Chronic obstructive pulmonary disease, unspecified: Secondary | ICD-10-CM | POA: Diagnosis not present

## 2018-04-28 DIAGNOSIS — Z7982 Long term (current) use of aspirin: Secondary | ICD-10-CM | POA: Diagnosis not present

## 2018-04-28 DIAGNOSIS — Z01812 Encounter for preprocedural laboratory examination: Secondary | ICD-10-CM | POA: Diagnosis not present

## 2018-04-28 DIAGNOSIS — Z79890 Hormone replacement therapy (postmenopausal): Secondary | ICD-10-CM | POA: Diagnosis not present

## 2018-04-28 DIAGNOSIS — R911 Solitary pulmonary nodule: Secondary | ICD-10-CM | POA: Diagnosis not present

## 2018-04-28 DIAGNOSIS — R918 Other nonspecific abnormal finding of lung field: Secondary | ICD-10-CM | POA: Diagnosis not present

## 2018-04-28 DIAGNOSIS — Z01810 Encounter for preprocedural cardiovascular examination: Secondary | ICD-10-CM | POA: Diagnosis not present

## 2018-04-28 DIAGNOSIS — Z79899 Other long term (current) drug therapy: Secondary | ICD-10-CM | POA: Diagnosis not present

## 2018-04-28 DIAGNOSIS — G4730 Sleep apnea, unspecified: Secondary | ICD-10-CM | POA: Diagnosis not present

## 2018-04-28 DIAGNOSIS — Z955 Presence of coronary angioplasty implant and graft: Secondary | ICD-10-CM | POA: Diagnosis not present

## 2018-04-28 DIAGNOSIS — I2510 Atherosclerotic heart disease of native coronary artery without angina pectoris: Secondary | ICD-10-CM | POA: Diagnosis not present

## 2018-04-28 DIAGNOSIS — I252 Old myocardial infarction: Secondary | ICD-10-CM | POA: Diagnosis not present

## 2018-04-28 DIAGNOSIS — R590 Localized enlarged lymph nodes: Secondary | ICD-10-CM | POA: Diagnosis not present

## 2018-04-28 DIAGNOSIS — M1990 Unspecified osteoarthritis, unspecified site: Secondary | ICD-10-CM | POA: Diagnosis not present

## 2018-04-28 HISTORY — DX: Pneumonia, unspecified organism: J18.9

## 2018-04-28 HISTORY — DX: Essential (primary) hypertension: I10

## 2018-04-28 LAB — CBC
HCT: 43.3 % (ref 39.0–52.0)
HEMOGLOBIN: 15 g/dL (ref 13.0–17.0)
MCH: 33 pg (ref 26.0–34.0)
MCHC: 34.6 g/dL (ref 30.0–36.0)
MCV: 95.2 fL (ref 78.0–100.0)
Platelets: 204 10*3/uL (ref 150–400)
RBC: 4.55 MIL/uL (ref 4.22–5.81)
RDW: 11.3 % — ABNORMAL LOW (ref 11.5–15.5)
WBC: 4.9 10*3/uL (ref 4.0–10.5)

## 2018-04-28 LAB — BASIC METABOLIC PANEL
ANION GAP: 7 (ref 5–15)
BUN: 7 mg/dL (ref 6–20)
CALCIUM: 9.6 mg/dL (ref 8.9–10.3)
CO2: 26 mmol/L (ref 22–32)
Chloride: 107 mmol/L (ref 101–111)
Creatinine, Ser: 1.05 mg/dL (ref 0.61–1.24)
Glucose, Bld: 99 mg/dL (ref 65–99)
Potassium: 4.1 mmol/L (ref 3.5–5.1)
Sodium: 140 mmol/L (ref 135–145)

## 2018-04-28 NOTE — Telephone Encounter (Signed)
Spoke with Lisabeth Pick at [facility]  She states that they are needing RB to put in the orders for his procedure  Please advise

## 2018-04-28 NOTE — Telephone Encounter (Signed)
Done

## 2018-04-28 NOTE — Progress Notes (Addendum)
Anesthesia Chart Review:   Case:  [id number] Date/Time:  04/30/18 0815   Procedures:      VIDEO BRONCHOSCOPY WITH ENDOBRONCHIAL ULTRASOUND (Right )     VIDEO BRONCHOSCOPY WITH ENDOBRONCHIAL NAVIGATION (Right )   Anesthesia type:  General   Pre-op diagnosis:  LUNG MASS   Location:  [facility] ROOM 10 / [facility] OR   Surgeon:  Collene Gobble, MD      DISCUSSION: - Pt is a 77 year old male with hx CAD (DES to LAD 2016), lung cancer, prostate cancer, OSA, HTN  - Last dose ASA 04/28/18   VS: BP (!) 152/81   Pulse (!) 53   Temp 36.5 C   Resp 20   Ht 5\' 9"  (1.753 m)   Wt 154 lb 8 oz (70.1 kg)   SpO2 99%   BMI 22.82 kg/m    PROVIDERS: - PCP is Bernell List, MD  - Cardiologist is Charolette Forward, MD who is aware of upcoming procedure.  Last office visit 04/21/18   LABS: Labs reviewed: Acceptable for surgery. (all labs ordered are listed, but only abnormal results are displayed)  Labs Reviewed  CBC - Abnormal; Notable for the following components:      Result Value   RDW 11.3 (*)    All other components within normal limits  BASIC METABOLIC PANEL     IMAGES:  CT super D chest 04/21/18:  1. Slowly enlarging spiculated right lower lobe mass, highly worrisome for adenocarcinoma. 2. Mild basilar predominant subpleural fibrosis. Nonspecific interstitial pneumonitis or usual interstitial pneumonitis could have this appearance. 3. Mildly enlarged AP window lymph node, similar to recent prior exams but new from 02/23/2015. 4. Aortic atherosclerosis (ICD10-170.0). Coronary artery calcification. 5. Moderate hiatal hernia.   EKG 04/28/18: Sinus bradycardia (54 bpm)   CV:  Nuclear stress test 10/15/16:  1. No reversible ischemia or infarction. 2. Normal left ventricular wall motion. 3. Left ventricular ejection fraction 64% 4. Non invasive risk stratification: low  Echo 09/23/15:  - Left ventricle: The cavity size was normal. Wall thickness was normal. Systolic function was vigorous. The  estimated ejection fraction was in the range of 65% to 70%. Wall motion was normal; there were no regional wall motion abnormalities. Doppler parameters are consistent with abnormal left ventricular relaxation (grade 1 diastolic dysfunction).  Cardiac cath 09/21/15:   Mid RCA lesion, 25% stenosed.  Prox LAD lesion, 99% stenosed. Post intervention, there is a 0% residual stenosis.  There is mild left ventricular systolic dysfunction. 1. Critical stenosis of the proximal LAD, treated successfully with a 3.5 mm drug-eluting stent 2. Minor nonobstructive stenosis of the RCA 3. Widely patent left circumflex with no significant stenosis 4. Mild segmental LV systolic dysfunction consistent with the patient's acute anterolateral MI, estimated LVEF 45-50%   Past Medical History:  Diagnosis Date  . Acute MI anterior wall first episode care ([place]) 09/21/2015  . Anemia   . Arthritis    back  . Coronary artery disease   . Dyspnea    "some"  . Emphysema lung ([place])    [facility]  . Enlarged lymph node   . GERD (gastroesophageal reflux disease)   . History of hiatal hernia   . History of kidney stones    2 lt kidney now  01/22/17  . Hypertension   . Hypothyroidism   . Lung cancer ([place])    small part in the right lower   . Pneumonia  as a child   . Prostate cancer ([place])   . PTSD (post-traumatic stress disorder)   . Sleep apnea    sleep study-   [facility]    [place]  [facility]    Past Surgical History:  Procedure Laterality Date  . ABDOMINAL EXPOSURE N/A 01/30/2017   Procedure: ABDOMINAL EXPOSURE;  Surgeon: Rosetta Posner, MD;  Location: [place];  Service: Vascular;  Laterality: N/A;  . ANTERIOR LUMBAR FUSION Bilateral 01/30/2017   Procedure: LUMBAR 5-SACRAL 1 ANTERIOR INTERBODY FUSION LUMBAR;  Surgeon: Phylliss Bob, MD;  Location: [place];  Service: Orthopedics;  Laterality: Bilateral;  LUMBAR 5-SACRUM 1 ANTERIOR INTERBODY FUSION LUMBAR   . CARDIAC CATHETERIZATION N/A 09/21/2015    Procedure: Left Heart Cath and Coronary Angiography;  Surgeon: Sherren Mocha, MD;  Location: [place] CV LAB;  Service: Cardiovascular;  Laterality: N/A;  . CARDIAC CATHETERIZATION Right 09/21/2015   Procedure: Coronary Stent Intervention;  Surgeon: Sherren Mocha, MD;  Location: [place] CV LAB;  Service: Cardiovascular;  Laterality: Right;  . COLONOSCOPY    . knee surgery    . SHOULDER SURGERY    . TONSILLECTOMY    . VIDEO BRONCHOSCOPY WITH ENDOBRONCHIAL ULTRASOUND N/A 06/19/2017   Procedure: VIDEO BRONCHOSCOPY WITH ENDOBRONCHIAL ULTRASOUND;  Surgeon: Collene Gobble, MD;  Location: [facility];  Service: Thoracic;  Laterality: N/A;    MEDICATIONS: . acetaminophen-codeine (TYLENOL #3) 300-30 MG tablet  . aspirin EC 81 MG tablet  . Carboxymethylcellul-Glycerin (REFRESH OPTIVE) 1-0.9 % GEL  . docusate sodium (COLACE) 100 MG capsule  . levothyroxine (SYNTHROID, LEVOTHROID) 50 MCG tablet  . lisinopril (PRINIVIL,ZESTRIL) 2.5 MG tablet  . nitroGLYCERIN (NITROSTAT) 0.4 MG SL tablet  . omeprazole (PRILOSEC) 20 MG capsule  . Polyethyl Glycol-Propyl Glycol (SYSTANE OP)  . PROVENTIL HFA 108 (90 BASE) MCG/ACT inhaler  . rOPINIRole (REQUIP) 0.25 MG tablet  . rosuvastatin (CRESTOR) 20 MG tablet   No current facility-administered medications for this encounter.     If no changes, I anticipate pt can proceed with surgery as scheduled.    Willeen Cass, FNP-BC [facility]/Anesthesiology Phone: [phone] 04/28/2018 4:08 PM

## 2018-04-28 NOTE — Progress Notes (Addendum)
PCP is Dr. Bernell List ([facility] in [place]) Cardiologist is Dr Terrence Dupont Denies chest pain, or fever, but does report a productive cough at times with cream colored sputum. Reports last dose of aspirin will be today 04-28-18 States that he no longer wears his CPAP Stress test noted 11-13-16- see Allyn Kenner note from 10-15-16 for report. Echo and cath noted 09-2015

## 2018-04-29 NOTE — Anesthesia Preprocedure Evaluation (Addendum)
Anesthesia Evaluation  Patient identified by MRN, date of birth, ID band Patient awake    Reviewed: Allergy & Precautions, H&P , NPO status , Patient's Chart, lab work & pertinent test results  Airway Mallampati: II  TM Distance: >3 FB Neck ROM: Full    Dental no notable dental hx. (+) Edentulous Upper, Edentulous Lower, Dental Advisory Given   Pulmonary sleep apnea , COPD,  COPD inhaler,    Pulmonary exam normal breath sounds clear to auscultation       Cardiovascular Exercise Tolerance: Good hypertension, Pt. on medications + CAD, + Past MI and + Cardiac Stents   Rhythm:Regular Rate:Normal     Neuro/Psych Anxiety negative neurological ROS     GI/Hepatic Neg liver ROS, hiatal hernia, GERD  Medicated and Controlled,  Endo/Other  negative endocrine ROSHypothyroidism   Renal/GU negative Renal ROS  negative genitourinary   Musculoskeletal  (+) Arthritis , Osteoarthritis,    Abdominal   Peds  Hematology negative hematology ROS (+) anemia ,   Anesthesia Other Findings   Reproductive/Obstetrics negative OB ROS                            Anesthesia Physical Anesthesia Plan  ASA: III  Anesthesia Plan: General   Post-op Pain Management:    Induction: Intravenous  PONV Risk Score and Plan: 3 and Ondansetron, Dexamethasone and Treatment may vary due to age or medical condition  Airway Management Planned: Oral ETT  Additional Equipment:   Intra-op Plan:   Post-operative Plan: Extubation in OR  Informed Consent: I have reviewed the patients History and Physical, chart, labs and discussed the procedure including the risks, benefits and alternatives for the proposed anesthesia with the patient or authorized representative who has indicated his/her understanding and acceptance.   Dental advisory given  Plan Discussed with: CRNA  Anesthesia Plan Comments:         Anesthesia Quick  Evaluation

## 2018-04-30 ENCOUNTER — Ambulatory Visit (HOSPITAL_COMMUNITY): Payer: Medicare Other

## 2018-04-30 ENCOUNTER — Ambulatory Visit (HOSPITAL_COMMUNITY)
Admission: RE | Admit: 2018-04-30 | Discharge: 2018-04-30 | Disposition: A | Discharge status: Home / Self Care | Payer: Medicare Other | Source: Ambulatory Visit | Attending: Emergency Medicine | Admitting: Emergency Medicine

## 2018-04-30 ENCOUNTER — Ambulatory Visit (HOSPITAL_COMMUNITY): Payer: Medicare Other | Admitting: Anesthesiology

## 2018-04-30 ENCOUNTER — Encounter (HOSPITAL_COMMUNITY)
Admission: RE | Disposition: A | Discharge status: Home / Self Care | Payer: Self-pay | Source: Ambulatory Visit | Attending: Emergency Medicine

## 2018-04-30 ENCOUNTER — Ambulatory Visit (HOSPITAL_COMMUNITY): Payer: Medicare Other | Admitting: Emergency Medicine

## 2018-04-30 ENCOUNTER — Encounter (HOSPITAL_COMMUNITY): Payer: Self-pay | Admitting: Emergency Medicine

## 2018-04-30 DIAGNOSIS — F419 Anxiety disorder, unspecified: Secondary | ICD-10-CM | POA: Diagnosis not present

## 2018-04-30 DIAGNOSIS — Z01812 Encounter for preprocedural laboratory examination: Secondary | ICD-10-CM | POA: Insufficient documentation

## 2018-04-30 DIAGNOSIS — Z9889 Other specified postprocedural states: Secondary | ICD-10-CM

## 2018-04-30 DIAGNOSIS — M1990 Unspecified osteoarthritis, unspecified site: Secondary | ICD-10-CM | POA: Diagnosis not present

## 2018-04-30 DIAGNOSIS — J449 Chronic obstructive pulmonary disease, unspecified: Secondary | ICD-10-CM | POA: Diagnosis not present

## 2018-04-30 DIAGNOSIS — I2510 Atherosclerotic heart disease of native coronary artery without angina pectoris: Secondary | ICD-10-CM | POA: Diagnosis not present

## 2018-04-30 DIAGNOSIS — Z79890 Hormone replacement therapy (postmenopausal): Secondary | ICD-10-CM | POA: Insufficient documentation

## 2018-04-30 DIAGNOSIS — G4730 Sleep apnea, unspecified: Secondary | ICD-10-CM | POA: Diagnosis not present

## 2018-04-30 DIAGNOSIS — I252 Old myocardial infarction: Secondary | ICD-10-CM | POA: Insufficient documentation

## 2018-04-30 DIAGNOSIS — Z955 Presence of coronary angioplasty implant and graft: Secondary | ICD-10-CM | POA: Diagnosis not present

## 2018-04-30 DIAGNOSIS — R9389 Abnormal findings on diagnostic imaging of other specified body structures: Secondary | ICD-10-CM | POA: Diagnosis present

## 2018-04-30 DIAGNOSIS — J984 Other disorders of lung: Secondary | ICD-10-CM | POA: Diagnosis not present

## 2018-04-30 DIAGNOSIS — Z419 Encounter for procedure for purposes other than remedying health state, unspecified: Secondary | ICD-10-CM

## 2018-04-30 DIAGNOSIS — R590 Localized enlarged lymph nodes: Secondary | ICD-10-CM | POA: Diagnosis not present

## 2018-04-30 DIAGNOSIS — Z7982 Long term (current) use of aspirin: Secondary | ICD-10-CM | POA: Insufficient documentation

## 2018-04-30 DIAGNOSIS — R918 Other nonspecific abnormal finding of lung field: Secondary | ICD-10-CM | POA: Diagnosis not present

## 2018-04-30 DIAGNOSIS — I10 Essential (primary) hypertension: Secondary | ICD-10-CM | POA: Diagnosis not present

## 2018-04-30 DIAGNOSIS — R911 Solitary pulmonary nodule: Secondary | ICD-10-CM

## 2018-04-30 DIAGNOSIS — Z01810 Encounter for preprocedural cardiovascular examination: Secondary | ICD-10-CM | POA: Insufficient documentation

## 2018-04-30 DIAGNOSIS — Z79899 Other long term (current) drug therapy: Secondary | ICD-10-CM | POA: Insufficient documentation

## 2018-04-30 HISTORY — PX: VIDEO BRONCHOSCOPY WITH ENDOBRONCHIAL NAVIGATION: SHX6175

## 2018-04-30 HISTORY — PX: VIDEO BRONCHOSCOPY WITH ENDOBRONCHIAL ULTRASOUND: SHX6177

## 2018-04-30 SURGERY — BRONCHOSCOPY, WITH EBUS
Anesthesia: General | Laterality: Right

## 2018-04-30 MED ORDER — MIDAZOLAM HCL 2 MG/2ML IJ SOLN
INTRAMUSCULAR | Status: AC
  Filled 2018-04-30: qty 2

## 2018-04-30 MED ORDER — FENTANYL CITRATE (PF) 100 MCG/2ML IJ SOLN: 25.0000 ug | INTRAMUSCULAR | Status: DC | PRN

## 2018-04-30 MED ORDER — SUGAMMADEX SODIUM 200 MG/2ML IV SOLN
INTRAVENOUS | Status: AC
  Filled 2018-04-30: qty 2

## 2018-04-30 MED ORDER — PROPOFOL 10 MG/ML IV BOLUS
INTRAVENOUS | Status: DC | PRN
  Administered 2018-04-30: 120 mg via INTRAVENOUS

## 2018-04-30 MED ORDER — LIDOCAINE 2% (20 MG/ML) 5 ML SYRINGE
INTRAMUSCULAR | Status: DC | PRN
  Administered 2018-04-30: 70 mg via INTRAVENOUS

## 2018-04-30 MED ORDER — SUGAMMADEX SODIUM 200 MG/2ML IV SOLN
INTRAVENOUS | Status: DC | PRN
  Administered 2018-04-30: 140 mg via INTRAVENOUS

## 2018-04-30 MED ORDER — SODIUM CHLORIDE 0.9 % IJ SOLN
INTRAMUSCULAR | Status: AC
  Filled 2018-04-30: qty 10

## 2018-04-30 MED ORDER — 0.9 % SODIUM CHLORIDE (POUR BTL) OPTIME
TOPICAL | Status: DC | PRN
  Administered 2018-04-30: 1000 mL

## 2018-04-30 MED ORDER — LACTATED RINGERS IV SOLN
INTRAVENOUS | Status: DC | PRN
  Administered 2018-04-30: 08:00:00 via INTRAVENOUS

## 2018-04-30 MED ORDER — ONDANSETRON HCL 4 MG/2ML IJ SOLN
INTRAMUSCULAR | Status: DC | PRN
  Administered 2018-04-30: 4 mg via INTRAVENOUS

## 2018-04-30 MED ORDER — MIDAZOLAM HCL 2 MG/2ML IJ SOLN
INTRAMUSCULAR | Status: DC | PRN
  Administered 2018-04-30: 1 mg via INTRAVENOUS

## 2018-04-30 MED ORDER — ROCURONIUM BROMIDE 10 MG/ML (PF) SYRINGE
PREFILLED_SYRINGE | INTRAVENOUS | Status: AC
  Filled 2018-04-30: qty 5

## 2018-04-30 MED ORDER — FENTANYL CITRATE (PF) 250 MCG/5ML IJ SOLN
INTRAMUSCULAR | Status: DC | PRN
  Administered 2018-04-30 (×2): 50 ug via INTRAVENOUS

## 2018-04-30 MED ORDER — LIDOCAINE 2% (20 MG/ML) 5 ML SYRINGE
INTRAMUSCULAR | Status: AC
  Filled 2018-04-30: qty 5

## 2018-04-30 MED ORDER — ROCURONIUM BROMIDE 10 MG/ML (PF) SYRINGE
PREFILLED_SYRINGE | INTRAVENOUS | Status: DC | PRN
  Administered 2018-04-30 (×2): 10 mg via INTRAVENOUS
  Administered 2018-04-30: 40 mg via INTRAVENOUS

## 2018-04-30 MED ORDER — EPHEDRINE SULFATE 50 MG/ML IJ SOLN
INTRAMUSCULAR | Status: AC
  Filled 2018-04-30: qty 1

## 2018-04-30 MED ORDER — PROPOFOL 10 MG/ML IV BOLUS
INTRAVENOUS | Status: AC
  Filled 2018-04-30: qty 20

## 2018-04-30 MED ORDER — FENTANYL CITRATE (PF) 250 MCG/5ML IJ SOLN
INTRAMUSCULAR | Status: AC
  Filled 2018-04-30: qty 5

## 2018-04-30 MED ORDER — DEXAMETHASONE SODIUM PHOSPHATE 10 MG/ML IJ SOLN
INTRAMUSCULAR | Status: DC | PRN
  Administered 2018-04-30: 10 mg via INTRAVENOUS

## 2018-04-30 MED ORDER — DEXAMETHASONE SODIUM PHOSPHATE 10 MG/ML IJ SOLN
INTRAMUSCULAR | Status: AC
  Filled 2018-04-30: qty 1

## 2018-04-30 MED ORDER — ONDANSETRON HCL 4 MG/2ML IJ SOLN
INTRAMUSCULAR | Status: AC
  Filled 2018-04-30: qty 2

## 2018-04-30 SURGICAL SUPPLY — 43 items
ADAPTER BRONCH F/PENTAX (ADAPTER) ×3 IMPLANT
BRUSH CYTOL CELLEBRITY 1.5X140 (MISCELLANEOUS) ×3 IMPLANT
BRUSH SUPERTRAX BIOPSY (INSTRUMENTS) IMPLANT
BRUSH SUPERTRAX NDL-TIP CYTO (INSTRUMENTS) ×3 IMPLANT
CANISTER SUCT 3000ML PPV (MISCELLANEOUS) ×3 IMPLANT
CHANNEL WORK EXTEND EDGE 180 (KITS) IMPLANT
CHANNEL WORK EXTEND EDGE 45 (KITS) IMPLANT
CHANNEL WORK EXTEND EDGE 90 (KITS) IMPLANT
CONT SPEC 4OZ CLIKSEAL STRL BL (MISCELLANEOUS) ×3 IMPLANT
COVER BACK TABLE 60X90IN (DRAPES) ×3 IMPLANT
COVER DOME SNAP 22 D (MISCELLANEOUS) ×3 IMPLANT
FILTER STRAW FLUID ASPIR (MISCELLANEOUS) IMPLANT
FORCEPS BIOP RJ4 1.8 (CUTTING FORCEPS) IMPLANT
FORCEPS BIOP SUPERTRX PREMAR (INSTRUMENTS) ×3 IMPLANT
GAUZE SPONGE 4X4 12PLY STRL (GAUZE/BANDAGES/DRESSINGS) ×3 IMPLANT
GLOVE BIO SURGEON STRL SZ7.5 (GLOVE) ×6 IMPLANT
GOWN STRL REUS W/ TWL LRG LVL3 (GOWN DISPOSABLE) ×2 IMPLANT
GOWN STRL REUS W/TWL LRG LVL3 (GOWN DISPOSABLE) ×4
KIT CLEAN ENDO COMPLIANCE (KITS) ×6 IMPLANT
KIT LOCATABLE GUIDE (CANNULA) IMPLANT
KIT MARKER FIDUCIAL DELIVERY (KITS) IMPLANT
KIT PROCEDURE EDGE 180 (KITS) ×3 IMPLANT
KIT PROCEDURE EDGE 45 (KITS) IMPLANT
KIT PROCEDURE EDGE 90 (KITS) IMPLANT
KIT TURNOVER KIT B (KITS) ×3 IMPLANT
MARKER SKIN DUAL TIP RULER LAB (MISCELLANEOUS) ×3 IMPLANT
NEEDLE EBUS SONO TIP PENTAX (NEEDLE) ×3 IMPLANT
NEEDLE SUPERTRX PREMARK BIOPSY (NEEDLE) ×3 IMPLANT
NS IRRIG 1000ML POUR BTL (IV SOLUTION) ×3 IMPLANT
OIL SILICONE PENTAX (PARTS (SERVICE/REPAIRS)) ×3 IMPLANT
PAD ARMBOARD 7.5X6 YLW CONV (MISCELLANEOUS) ×6 IMPLANT
PATCHES PATIENT (LABEL) ×9 IMPLANT
SYR 20CC LL (SYRINGE) ×6 IMPLANT
SYR 20ML ECCENTRIC (SYRINGE) ×6 IMPLANT
SYR 50ML SLIP (SYRINGE) ×3 IMPLANT
SYR 5ML LUER SLIP (SYRINGE) ×3 IMPLANT
TOWEL OR 17X24 6PK STRL BLUE (TOWEL DISPOSABLE) ×3 IMPLANT
TRAP SPECIMEN MUCOUS 40CC (MISCELLANEOUS) IMPLANT
TUBE CONNECTING 20'X1/4 (TUBING) ×1
TUBE CONNECTING 20X1/4 (TUBING) ×2 IMPLANT
UNDERPAD 30X30 (UNDERPADS AND DIAPERS) ×3 IMPLANT
VALVE DISPOSABLE (MISCELLANEOUS) ×3 IMPLANT
WATER STERILE IRR 1000ML POUR (IV SOLUTION) ×3 IMPLANT

## 2018-04-30 NOTE — Discharge Instructions (Signed)
Flexible Bronchoscopy, Care After These instructions give you information on caring for yourself after your procedure. Your doctor may also give you more specific instructions. Call your doctor if you have any problems or questions after your procedure. Follow these instructions at home:  Do not eat or drink anything for 2 hours after your procedure. If you try to eat or drink before the medicine wears off, food or drink could go into your lungs. You could also burn yourself.  After 2 hours have passed and when you can cough and gag normally, you may eat soft food and drink liquids slowly.  The day after the test, you may eat your normal diet.  You may do your normal activities.  Keep all doctor visits. Get help right away if:  You get more and more short of breath.  You get light-headed.  You feel like you are going to pass out (faint).  You have chest pain.  You have new problems that worry you.  You cough up more than a little blood.  You cough up more blood than before.  Please call our office for any questions or concerns.  [phone].   This information is not intended to replace advice given to you by your health care provider. Make sure you discuss any questions you have with your health care provider. Document Released: 09/02/2009 Document Revised: 04/12/2016 Document Reviewed: 07/10/2013 Elsevier Interactive Patient Education  2017 [employer].

## 2018-04-30 NOTE — Interval H&P Note (Signed)
PCCM Interval Note  Gregory Scott is 38, never smoker with a history of prostate cancer.  We have been following pulmonary nodular disease and mediastinal lymphadenopathy by serial CT scans of the chest.  He underwent an endobronchial ultrasound that was nondiagnostic in August 2018.  On serial imaging his right pretracheal node has increased in size, his right lower lobe groundglass opacity is also increased.  The nodes are PET positive and the right lower lobe lesion is very mildly hypermetabolic based on PET scan from 04/23/2018.  I recommended that we repeat his bronchoscopy with interbronchial ultrasound and navigation to reattempt tissue diagnosis.  He reports that he is been doing well, feeling well no change in his breathing or coughing.  He understands the procedure, the risks, the benefits.  We reviewed these again today.  He agrees to proceed.  Vitals:   04/30/18 0641  BP: (!) 161/86  Pulse: (!) 53  Resp: 20  Temp: 97.7 F (36.5 C)  TempSrc: Oral  SpO2: 99%   Gen: Pleasant, well-nourished, in no distress,  normal affect  ENT: No lesions,  mouth clear, M1-2 airway,  oropharynx clear, no postnasal drip  Neck: No JVD, no stridor  Lungs: No use of accessory muscles, distant but clear  Cardiovascular: RRR, heart sounds normal, no murmur or gallops, no peripheral edema  Abdomen: soft and NT, no HSM,  BS normal  Musculoskeletal: No deformities, no cyanosis or clubbing  Neuro: alert, non focal  Skin: Warm, no lesions or rashes   Impression/plan: Mediastinal lymphadenopathy at 4R, 7, 4L.  Also with an enlarging poorly formed right lower lobe opacity suspicious for slow-growing adenocarcinoma.  Plan for bronchoscopy, endobronchial ultrasound, navigation today.  No barriers identified and he agrees to proceed.  Baltazar Apo, MD, PhD 04/30/2018, 8:30 AM [facility] [phone] or if no answer [phone]

## 2018-04-30 NOTE — Transfer of Care (Signed)
Immediate Anesthesia Transfer of Care Note  Patient: Gregory Scott  Procedure(s) Performed: VIDEO BRONCHOSCOPY WITH ENDOBRONCHIAL ULTRASOUND (Right ) VIDEO BRONCHOSCOPY WITH ENDOBRONCHIAL NAVIGATION (Right )  Patient Location: PACU  Anesthesia Type:General  Level of Consciousness: awake, alert , oriented and patient cooperative  Airway & Oxygen Therapy: Patient Spontanous Breathing and Patient connected to face mask oxygen  Post-op Assessment: Report given to RN and Post -op Vital signs reviewed and stable  Post vital signs: Reviewed and stable  Last Vitals:  Vitals Value Taken Time  BP 141/94 04/30/2018 11:01 AM  Temp    Pulse 73 04/30/2018 11:01 AM  Resp 14 04/30/2018 11:01 AM  SpO2 100 % 04/30/2018 11:01 AM  Vitals shown include unvalidated device data.  Last Pain:  Vitals:   04/30/18 0702  TempSrc:   PainSc: 0-No pain      Patients Stated Pain Goal: 2 (11/73/56 7014)  Complications: No apparent anesthesia complications

## 2018-04-30 NOTE — Anesthesia Postprocedure Evaluation (Signed)
Anesthesia Post Note  Patient: Gregory Scott  Procedure(s) Performed: VIDEO BRONCHOSCOPY WITH ENDOBRONCHIAL ULTRASOUND (Right ) VIDEO BRONCHOSCOPY WITH ENDOBRONCHIAL NAVIGATION (Right )     Patient location during evaluation: PACU Anesthesia Type: General Level of consciousness: awake and alert Pain management: pain level controlled Vital Signs Assessment: post-procedure vital signs reviewed and stable Respiratory status: spontaneous breathing, nonlabored ventilation and respiratory function stable Cardiovascular status: blood pressure returned to baseline and stable Postop Assessment: no apparent nausea or vomiting Anesthetic complications: no    Last Vitals:  Vitals:   04/30/18 1125 04/30/18 1130  BP: 130/73 133/79  Pulse: 64 64  Resp: 12 12  Temp: (!) 36.4 C   SpO2: 95% 95%    Last Pain:  Vitals:   04/30/18 1130  TempSrc:   PainSc: 0-No pain                 Jalen Daluz,W. EDMOND

## 2018-04-30 NOTE — Anesthesia Procedure Notes (Signed)
Procedure Name: Intubation Date/Time: 04/30/2018 8:44 AM Performed by: Raenette Rover, CRNA Pre-anesthesia Checklist: Patient identified, Emergency Drugs available, Suction available and Patient being monitored Patient Re-evaluated:Patient Re-evaluated prior to induction Oxygen Delivery Method: Circle system utilized Preoxygenation: Pre-oxygenation with 100% oxygen Induction Type: IV induction Ventilation: Mask ventilation without difficulty Laryngoscope Size: Mac and 3 Grade View: Grade I Tube type: Oral Tube size: 8.5 mm Number of attempts: 1 Airway Equipment and Method: Stylet Placement Confirmation: ETT inserted through vocal cords under direct vision,  positive ETCO2,  CO2 detector and breath sounds checked- equal and bilateral Secured at: 22 cm Tube secured with: Tape Dental Injury: Teeth and Oropharynx as per pre-operative assessment

## 2018-04-30 NOTE — Op Note (Signed)
Video Bronchoscopy with Endobronchial Ultrasound and Electromagnetic Navigation Procedure Note  Date of Operation: 04/30/2018  Pre-op Diagnosis: Right lower lobe nodule, mediastinal lymphadenopathy  Post-op Diagnosis: Same  Surgeon: Baltazar Apo  Assistants: None  Anesthesia: General endotracheal anesthesia  Operation: Flexible video fiberoptic bronchoscopy with electromagnetic navigation and biopsies.  Estimated Blood Loss: Minimal  Complications: None apparent  Indications and History: Gregory Scott is a 77 y.o. male never smoker with a slowly enlarging right lower lobe pulmonary nodule and some mediastinal lymphadenopathy that has been hypermetabolic on PET scan.  Recommendation was made to achieve a tissue diagnosis via navigational bronchoscopy and endobronchial ultrasound with biopsies.  The risks, benefits, complications, treatment options and expected outcomes were discussed with the patient.  The possibilities of pneumothorax, pneumonia, reaction to medication, pulmonary aspiration, perforation of a viscus, bleeding, failure to diagnose a condition and creating a complication requiring transfusion or operation were discussed with the patient who freely signed the consent.    Description of Procedure: The patient was seen in the Preoperative Area, was examined and was deemed appropriate to proceed.  The patient was taken to OR 10, identified as Gregory Scott and the procedure verified as Flexible Video Fiberoptic Bronchoscopy.  A Time Out was held and the above information confirmed.   After being taken to the operating room general anesthesia was initiated and the patient  was orally intubated. The video fiberoptic bronchoscope was introduced via the endotracheal tube and a general inspection was performed which showed normal airways throughout. The standard scope was then withdrawn and the endobronchial ultrasound was used to identify and characterize the peritracheal,  hilar and bronchial lymph nodes. Inspection showed some poorly formed areas of lymphatic tissue at 4L, 7, 4R. Using real-time ultrasound guidance Wang needle biopsies were take from Station 4L, 4R, 7 nodes and were sent for cytology.   Prior to the date of the procedure a high-resolution CT scan of the chest was performed. Utilizing [place] a virtual tracheobronchial tree was generated to allow the creation of distinct navigation pathways to the patient's parenchymal abnormality.  Standard scope was reintroduced.  The extendable working channel and locator guide were introduced into the bronchoscope. The distinct navigation pathways prepared prior to this procedure were then utilized to navigate to within 1.0 to 2.0 cm of patient's lesion identified on CT scan. The extendable working channel was secured into place and the locator guide was withdrawn. Under fluoroscopic guidance transbronchial needle brushings were performed and sent for cytology. A bronchioalveolar lavage was performed in the right lower lobe adjacent to the nodule and and sent for cytology and microbiology (bacterial, fungal, AFB smears and cultures). At the end of the procedure a general airway inspection was performed and there was no evidence of active bleeding. The bronchoscope was removed.  The patient tolerated the procedure well. There was no significant blood loss and there were no obvious complications. A post-procedural chest x-ray is pending.  Samples: 1. Wang needle biopsies from 4L node 2. Wang needle biopsies from 4R node 3. Wang needle biopsies from 7 node 4 Transbronchial needle brushings from RLL nodule 5. Bronchoalveolar lavage from RLL  Plans:  The patient will be discharged from the PACU to home when recovered from anesthesia. We will review the cytology, pathology and microbiology results with the patient when they become available. Outpatient followup will be with Dr Lamonte Sakai.   Baltazar Apo, MD,  PhD 04/30/2018, 10:57 AM [facility] [phone] or if no answer  [phone]     

## 2018-05-01 ENCOUNTER — Encounter (HOSPITAL_COMMUNITY): Payer: Self-pay | Admitting: Emergency Medicine

## 2018-05-01 LAB — ACID FAST SMEAR (AFB, MYCOBACTERIA): Acid Fast Smear: NEGATIVE

## 2018-05-01 LAB — ACID FAST SMEAR (AFB)

## 2018-05-02 LAB — CULTURE, RESPIRATORY W GRAM STAIN: Culture: NO GROWTH

## 2018-05-02 LAB — CULTURE, RESPIRATORY

## 2018-05-05 ENCOUNTER — Telehealth: Payer: Self-pay | Admitting: Emergency Medicine

## 2018-05-05 NOTE — Telephone Encounter (Signed)
Spoke with patient's wife to review FOB results.  Wang needle biopsies from 4L, 4R, 7 are all negative.  Transbronchial needle brushings from the right lower lobe nodule as well as a right lower lobe BAL are also negative.  I explained her that this is good news although the sensitivity of our test is not 100% sensitive.  We will need to continue surveillance imaging. We have a f/u visit planned for July, will discuss timing of repeat imaging at that visit.

## 2018-05-19 DIAGNOSIS — C61 Malignant neoplasm of prostate: Secondary | ICD-10-CM | POA: Diagnosis not present

## 2018-06-04 LAB — FUNGUS CULTURE RESULT

## 2018-06-04 LAB — FUNGUS CULTURE WITH STAIN

## 2018-06-04 LAB — FUNGAL ORGANISM REFLEX

## 2018-06-10 ENCOUNTER — Encounter: Payer: Self-pay | Admitting: Emergency Medicine

## 2018-06-10 ENCOUNTER — Ambulatory Visit (INDEPENDENT_AMBULATORY_CARE_PROVIDER_SITE_OTHER): Payer: Medicare Other | Admitting: Emergency Medicine

## 2018-06-10 VITALS — BP 134/78 | HR 58 | Ht 68.4 in | Wt 158.4 lb

## 2018-06-10 DIAGNOSIS — R911 Solitary pulmonary nodule: Secondary | ICD-10-CM

## 2018-06-10 DIAGNOSIS — R0600 Dyspnea, unspecified: Secondary | ICD-10-CM | POA: Diagnosis not present

## 2018-06-10 LAB — PULMONARY FUNCTION TEST
DL/VA % PRED: 80 %
DL/VA: 3.65 ml/min/mmHg/L
DLCO COR % PRED: 74 %
DLCO UNC % PRED: 75 %
DLCO cor: 22.87 ml/min/mmHg
DLCO unc: 23.13 ml/min/mmHg
FEF 25-75 POST: 3.42 L/s
FEF 25-75 Pre: 2.14 L/sec
FEF2575-%CHANGE-POST: 59 %
FEF2575-%PRED-POST: 175 %
FEF2575-%Pred-Pre: 109 %
FEV1-%CHANGE-POST: 11 %
FEV1-%PRED-POST: 120 %
FEV1-%Pred-Pre: 107 %
FEV1-Post: 3.36 L
FEV1-Pre: 3 L
FEV1FVC-%CHANGE-POST: 1 %
FEV1FVC-%PRED-PRE: 101 %
FEV6-%Change-Post: 11 %
FEV6-%Pred-Post: 123 %
FEV6-%Pred-Pre: 110 %
FEV6-PRE: 4.03 L
FEV6-Post: 4.49 L
FEV6FVC-%Change-Post: 0 %
FEV6FVC-%PRED-PRE: 105 %
FEV6FVC-%Pred-Post: 106 %
FVC-%CHANGE-POST: 10 %
FVC-%Pred-Post: 116 %
FVC-%Pred-Pre: 105 %
FVC-PRE: 4.12 L
FVC-Post: 4.54 L
PRE FEV6/FVC RATIO: 98 %
Post FEV1/FVC ratio: 74 %
Post FEV6/FVC ratio: 99 %
Pre FEV1/FVC ratio: 73 %
RV % PRED: 101 %
RV: 2.58 L
TLC % pred: 99 %
TLC: 6.76 L

## 2018-06-10 NOTE — Progress Notes (Signed)
PFT done today. 

## 2018-06-10 NOTE — Progress Notes (Signed)
Subjective:    Patient ID: Gregory Scott, male    DOB: 1941-04-09, 77 y.o.   MRN: [id number]  HPI 77 year old never smoker with a history of prostate cancer, also with pulmonary nodules that are been followed with serial CT scans of the chest. There has been a lower lobe ill-defined nodule that is increased in size prompting a PET CT scan on 05/30/17 that I have reviewed. This shows an ill-defined nodular right lower lobe lobulated lesion measures 27 x 11 mm creased in size compared with 2016 CT scan. There is very mild metabolic activity (SUV 1.8). Also noted is a 1 cm precarinal lymph node with mild hypermetabolic activity (SUV 3.4). He has never had prostatic spread, PSA has been normal.   Past medical history also significant for coronary disease, MI and stenting, arthritis, hypothyroidism, sleep apnea (good compliance CPAP). Recent back surgery that required general anesthesia.   No significant coughing. No fever, no night sweats. No known TB exposure. He has some exertional dyspnea. Still able to do his chores.   Rov 06/10/18 --77 year old gentleman, never smoker who I have followed for abnormal CT scan of the chest that was characterized by mediastinal lymphadenopathy and a right lower lobe groundglass opacity.  He had an unrevealing endobronchial ultrasound in August 2018.  Based on interval change in his CT chest 03/20/2018 I took him back to the operating room and we performed repeat endobronchial ultrasound, navigational bronchoscopy.  Again everything was negative.  He returns today for further evaluation.  He underwent pulmonary function testing  today that I have reviewed.  Spirometry shows grossly normal airflows with some possible mild obstruction as evidenced by significant improvement in his FEF 25 to 75%.  His lung volumes are normal and his diffusion capacity is slightly decreased, corrects to the normal range when adjusted for alveolar volume. He has albuterol but uses it rarely  in the winter.    Review of Systems  Constitutional: Positive for unexpected weight change. Negative for fever.  HENT: Positive for congestion. Negative for dental problem, ear pain, nosebleeds, postnasal drip, rhinorrhea, sinus pressure, sneezing, sore throat and trouble swallowing.   Eyes: Negative for redness and itching.  Respiratory: Positive for cough and shortness of breath. Negative for chest tightness and wheezing.   Cardiovascular: Positive for palpitations. Negative for leg swelling.  Gastrointestinal: Negative for nausea and vomiting.  Genitourinary: Negative for dysuria.  Musculoskeletal: Negative for joint swelling.  Skin: Negative for rash.  Neurological: Negative for headaches.  Hematological: Does not bruise/bleed easily.  Psychiatric/Behavioral: Positive for dysphoric mood. The patient is not nervous/anxious.        Objective:   Physical Exam Vitals:   06/10/18 1458  BP: 134/78  Pulse: (!) 58  SpO2: 98%  Weight: 158 lb 6.4 oz (71.8 kg)  Height: 5' 8.4" (1.737 m)   Gen: Pleasant, thin man,  in no distress,  normal affect  ENT: No lesions,  mouth clear,  oropharynx clear, no postnasal drip  Neck: No JVD, no stridor  Lungs: No use of accessory muscles, distant but clear  Cardiovascular: RRR, heart sounds normal, no murmur or gallops, no peripheral edema  Musculoskeletal: No deformities, no cyanosis or clubbing  Neuro: alert, mild resting tremor, nonfocal, no deficits noted  Skin: Warm, no lesions or rash      Assessment & Plan:  Pulmonary nodule, right His Ebus and navigational bronchoscopy are again negative for malignancy.  I am still suspicious that the right lower lobe nodule is  a slow-growing adenocarcinoma.  We will follow in 6 months.  If it is enlarging, changing in other characteristics then I will discuss an alternative biopsy with him since bronchoscopy is been unrevealing.  Dyspnea Pulmonary function testing shows some mild evidence for  obstruction although his numbers are grossly normal.  I think is reasonable for him to continue use albuterol on an as-needed basis.  No indication to start a schedule bronchodilator at this time.  Baltazar Apo, MD, PhD 06/10/2018, 3:25 PM [facility] [phone] or if no answer [phone]

## 2018-06-10 NOTE — Assessment & Plan Note (Signed)
Pulmonary function testing shows some mild evidence for obstruction although his numbers are grossly normal.  I think is reasonable for him to continue use albuterol on an as-needed basis.  No indication to start a schedule bronchodilator at this time.

## 2018-06-10 NOTE — Patient Instructions (Addendum)
Repeat CT chest 10/2018 to follow enlarged lymph nodes and a right lower lobe pulmonary nodule. Your pulmonary function testing from 06/10/2018 is overall mostly normal.  There may be some evidence for mild asthma.  Please keep your albuterol available to use 2 puffs if needed for shortness of breath, wheezing, chest tightness. Follow with Dr Lamonte Sakai in December after your CT scan to review the results together.

## 2018-06-10 NOTE — Assessment & Plan Note (Signed)
His Ebus and navigational bronchoscopy are again negative for malignancy.  I am still suspicious that the right lower lobe nodule is a slow-growing adenocarcinoma.  We will follow in 6 months.  If it is enlarging, changing in other characteristics then I will discuss an alternative biopsy with him since bronchoscopy is been unrevealing.

## 2018-06-11 LAB — ACID FAST CULTURE WITH REFLEXED SENSITIVITIES (MYCOBACTERIA): Acid Fast Culture: NEGATIVE

## 2018-07-28 DIAGNOSIS — E039 Hypothyroidism, unspecified: Secondary | ICD-10-CM | POA: Diagnosis not present

## 2018-07-28 DIAGNOSIS — J449 Chronic obstructive pulmonary disease, unspecified: Secondary | ICD-10-CM | POA: Diagnosis not present

## 2018-07-28 DIAGNOSIS — I252 Old myocardial infarction: Secondary | ICD-10-CM | POA: Diagnosis not present

## 2018-07-28 DIAGNOSIS — I2510 Atherosclerotic heart disease of native coronary artery without angina pectoris: Secondary | ICD-10-CM | POA: Diagnosis not present

## 2018-07-28 DIAGNOSIS — E785 Hyperlipidemia, unspecified: Secondary | ICD-10-CM | POA: Diagnosis not present

## 2018-07-28 DIAGNOSIS — I10 Essential (primary) hypertension: Secondary | ICD-10-CM | POA: Diagnosis not present

## 2018-08-28 DIAGNOSIS — N200 Calculus of kidney: Secondary | ICD-10-CM | POA: Diagnosis not present

## 2018-08-28 DIAGNOSIS — C61 Malignant neoplasm of prostate: Secondary | ICD-10-CM | POA: Diagnosis not present

## 2018-10-23 ENCOUNTER — Ambulatory Visit (INDEPENDENT_AMBULATORY_CARE_PROVIDER_SITE_OTHER)
Admission: RE | Admit: 2018-10-23 | Discharge: 2018-10-23 | Disposition: A | Discharge status: Home / Self Care | Payer: Medicare Other | Source: Ambulatory Visit | Attending: Emergency Medicine | Admitting: Emergency Medicine

## 2018-10-23 DIAGNOSIS — K449 Diaphragmatic hernia without obstruction or gangrene: Secondary | ICD-10-CM | POA: Diagnosis not present

## 2018-10-23 DIAGNOSIS — R911 Solitary pulmonary nodule: Secondary | ICD-10-CM | POA: Diagnosis not present

## 2018-11-07 IMAGING — DX DG CHEST 1V PORT
1 series · 1 of 1 positions shown · non-contrast
Comparison: Chest CT 04/21/2018

CLINICAL DATA: Postop chest

EXAM:
PORTABLE CHEST 1 VIEW

[chest ap]
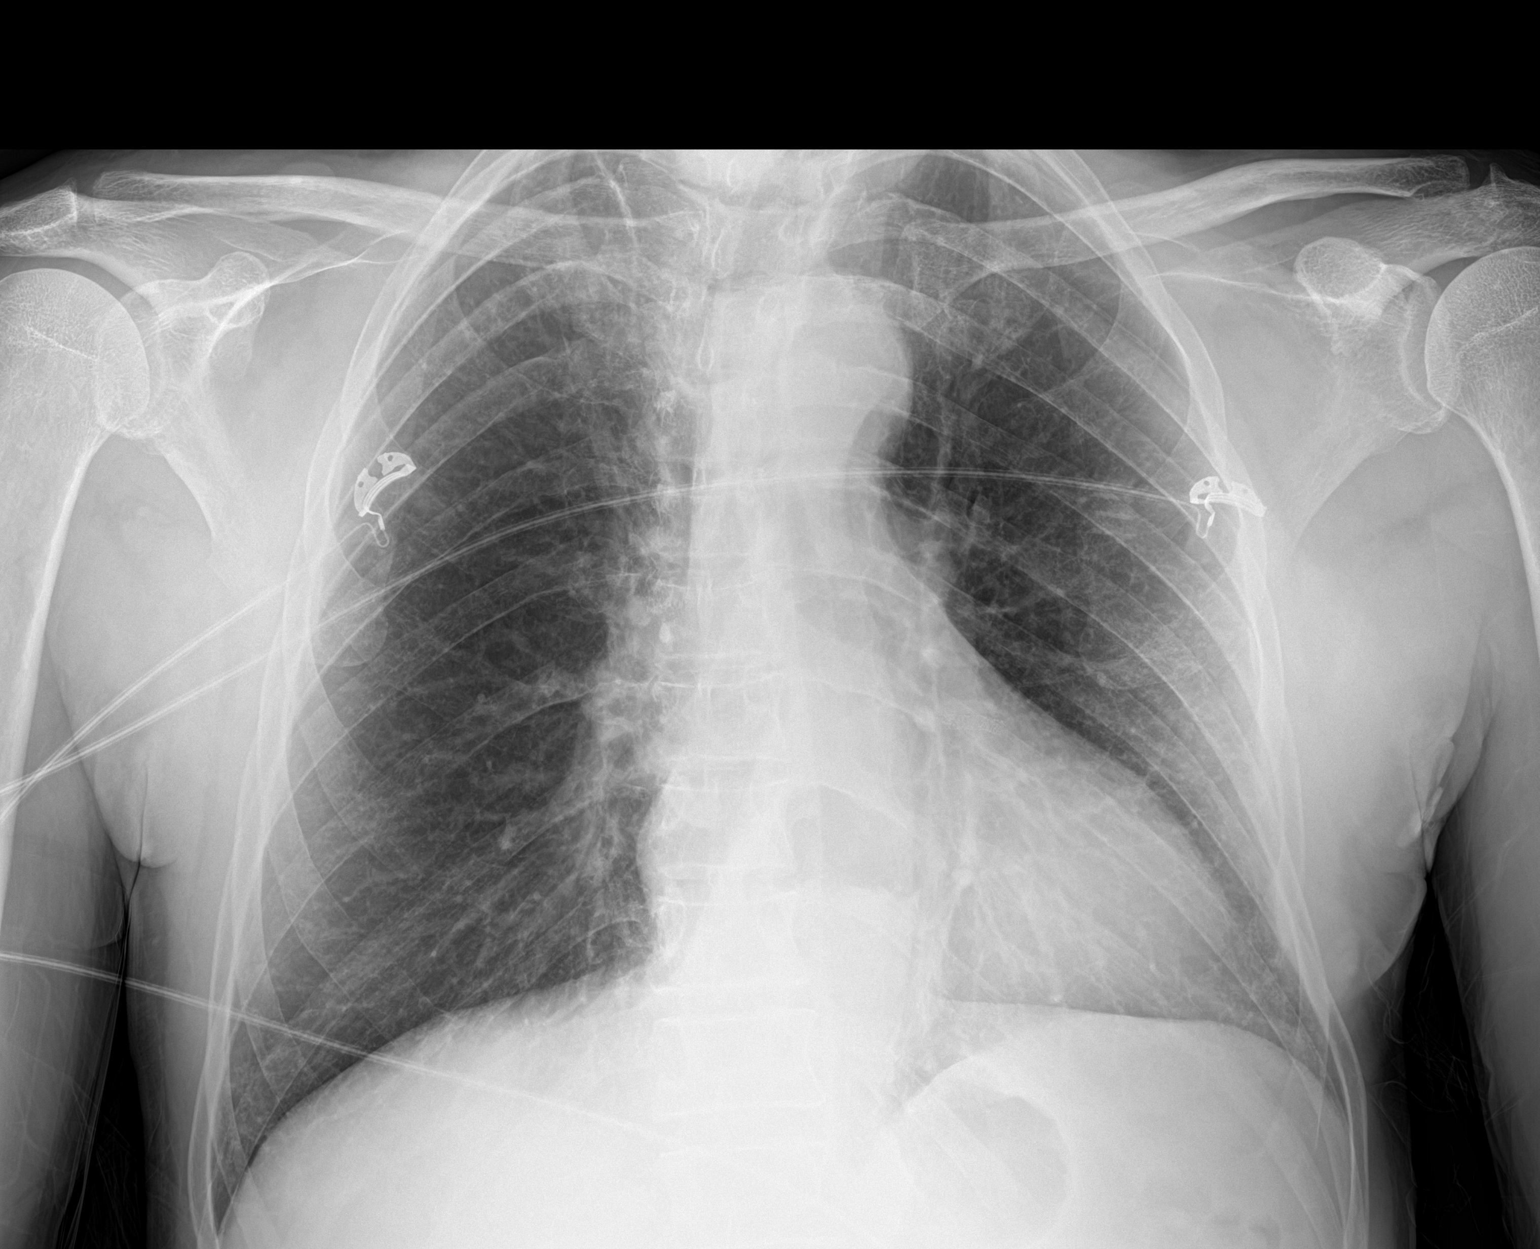

[1 of 1 positions shown; findings below may reference images not displayed]

FINDINGS: Right apical scarring. Heart is borderline in size. Moderate-sized
hiatal hernia. No confluent airspace opacities or effusions. No
pneumothorax.
IMPRESSION: No active disease.

## 2018-11-29 IMAGING — US US EXTREM LOW VENOUS BILAT
1 series · 13 of 24 positions shown · non-contrast
Comparison: New

CLINICAL DATA: BILATERAL leg pain, question postoperative DVT,
underwent back surgery on 01/30/2017, history lung cancer, prostate
cancer



[Series 1: us extrem low venous bilat · 0.06mm/px · 13 of 62 slices shown]
[im 1/62]
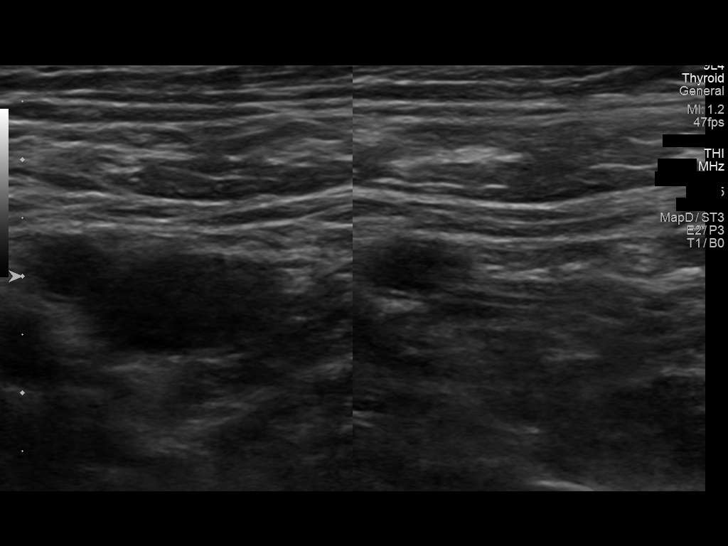
[im 6/62]
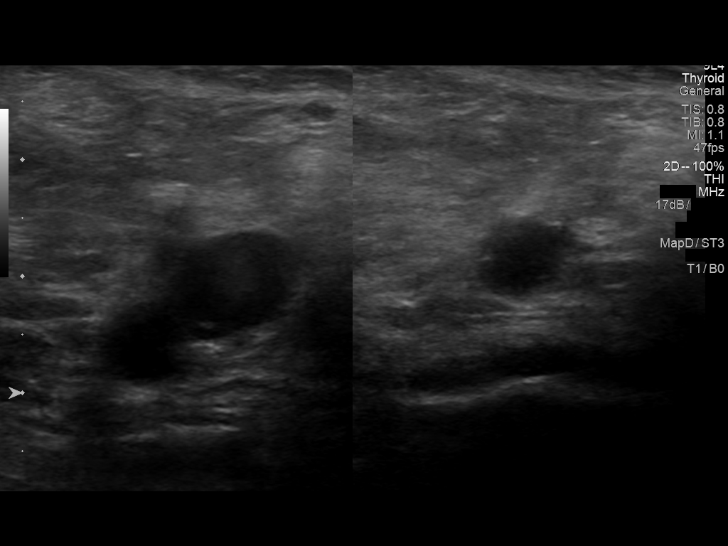
[im 11/62]
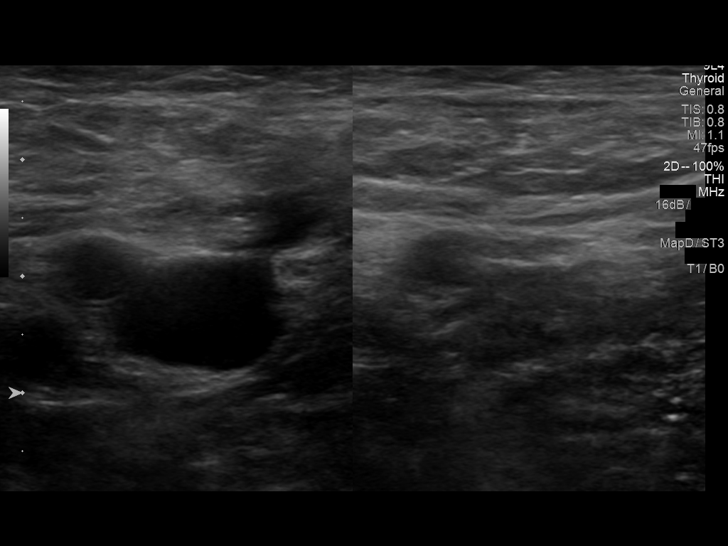
[im 16/62]
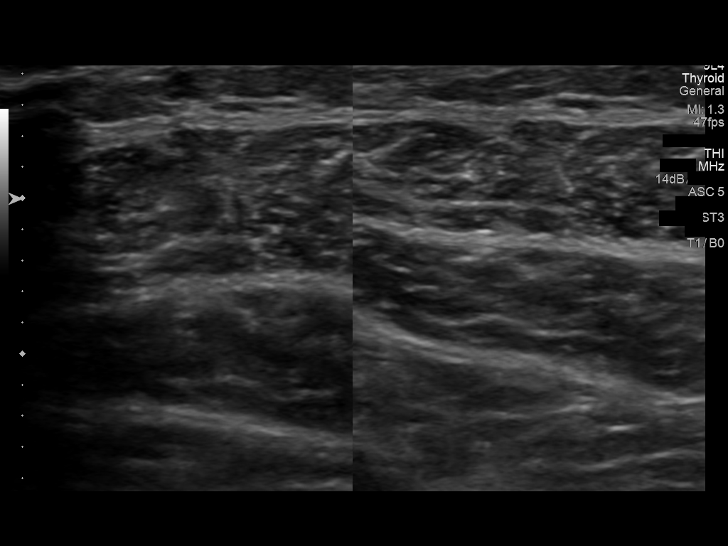
[im 22/62]
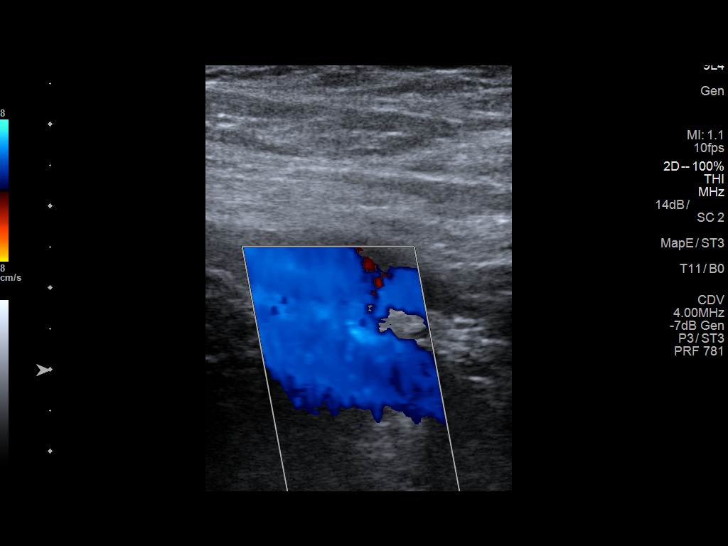
[im 27/62]
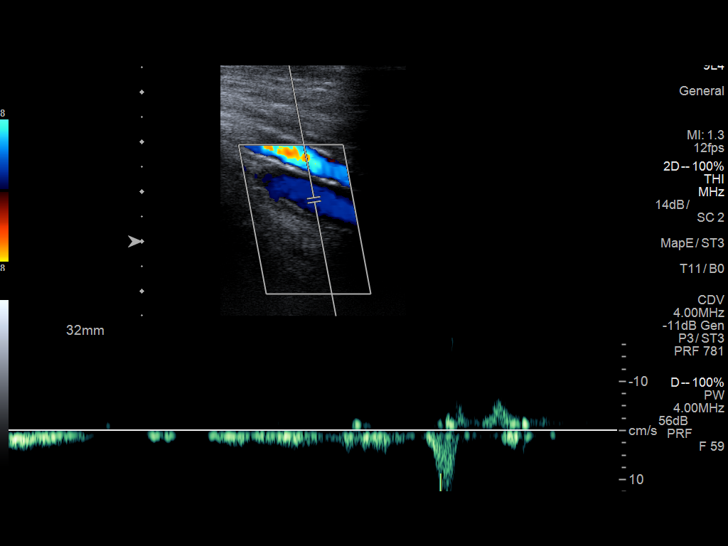
[im 32/62]
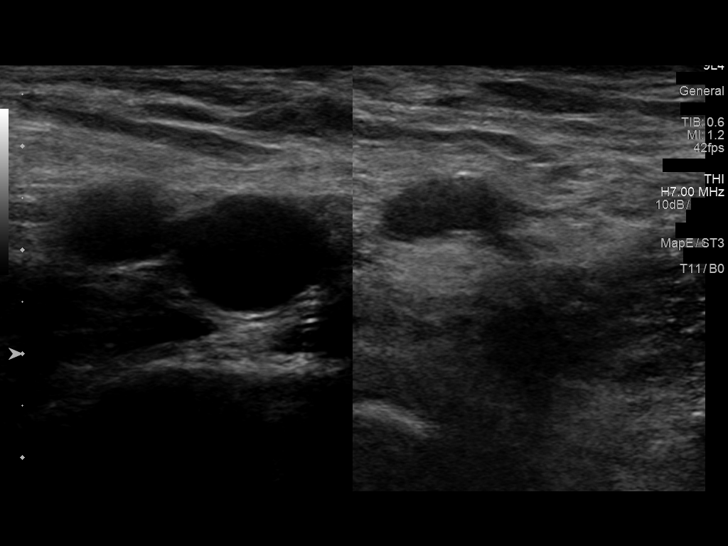
[im 35/62]
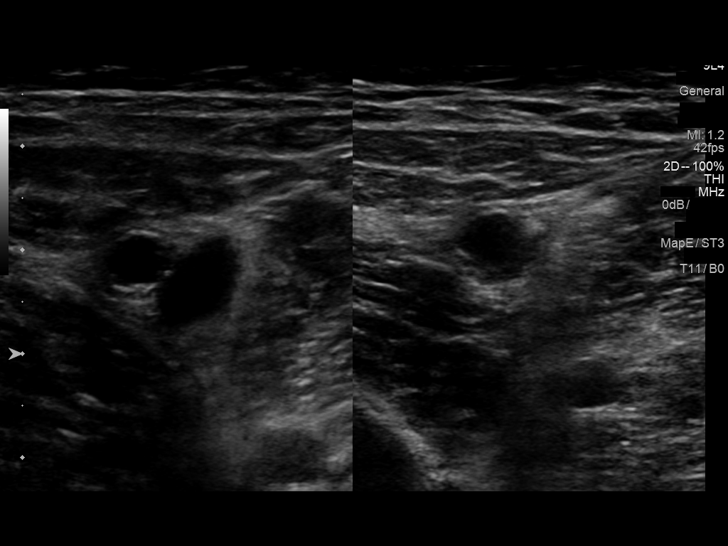
[im 40/62]
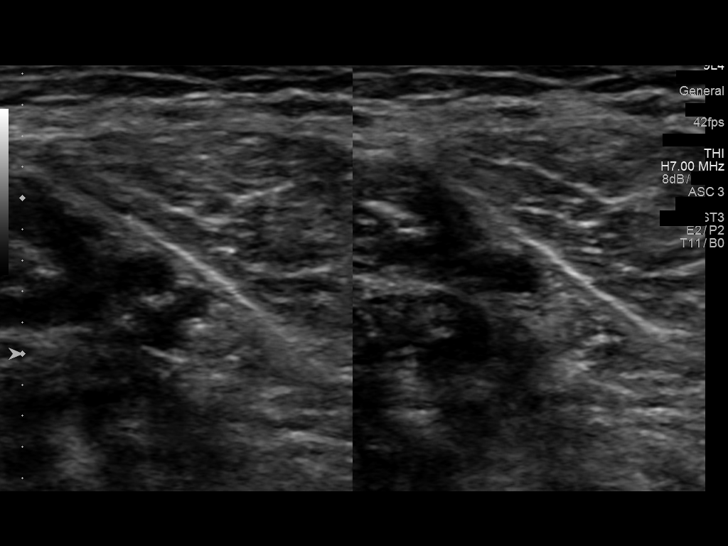
[im 46/62]
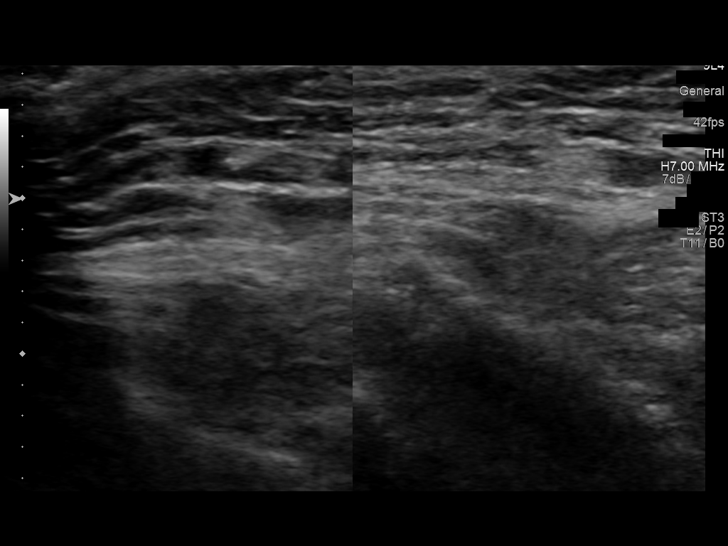
[im 51/62]
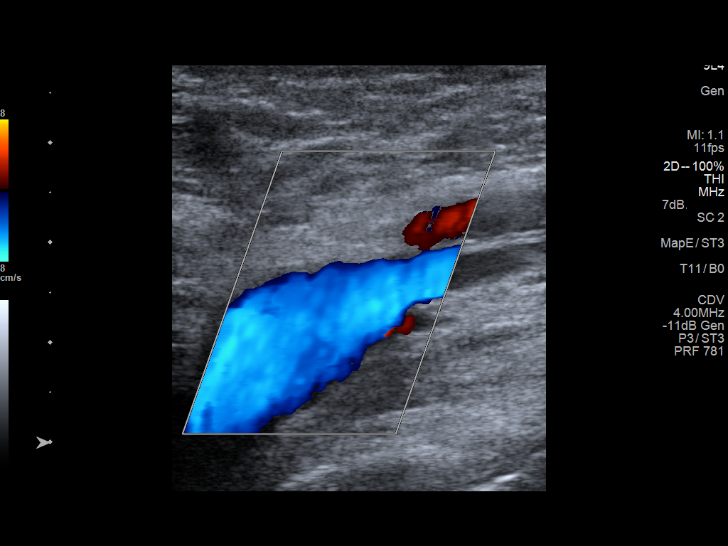
[im 56/62]
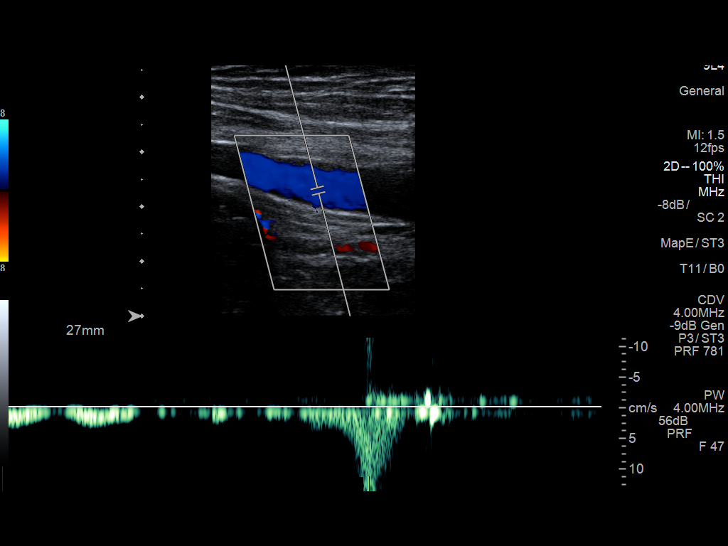
[im 62/62]
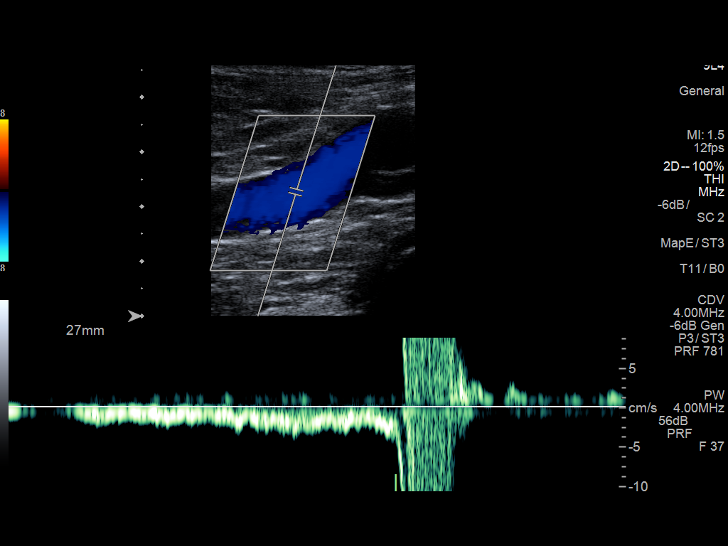

[13 of 24 positions shown; findings below may reference images not displayed]

FINDINGS: RIGHT LOWER EXTREMITY

Common Femoral Vein: No evidence of thrombus. Normal
compressibility, respiratory phasicity and response to augmentation.

Saphenofemoral Junction: No evidence of thrombus. Normal
compressibility and flow on color Doppler imaging.

Profunda Femoral Vein: No evidence of thrombus. Normal
compressibility and flow on color Doppler imaging.

Femoral Vein: No evidence of thrombus. Normal compressibility,
respiratory phasicity and response to augmentation.

Popliteal Vein: No evidence of thrombus. Normal compressibility,
respiratory phasicity and response to augmentation.

Calf Veins: No evidence of thrombus. Normal compressibility and flow
on color Doppler imaging.

Superficial Great Saphenous Vein: No evidence of thrombus. Normal
compressibility and flow on color Doppler imaging.

Venous Reflux:  None.

Other Findings: Atherosclerotic calcifications at the popliteal
artery

LEFT LOWER EXTREMITY

Common Femoral Vein: No evidence of thrombus. Normal
compressibility, respiratory phasicity and response to augmentation.

Saphenofemoral Junction: No evidence of thrombus. Normal
compressibility and flow on color Doppler imaging.

Profunda Femoral Vein: No evidence of thrombus. Normal
compressibility and flow on color Doppler imaging.

Femoral Vein: No evidence of thrombus. Normal compressibility,
respiratory phasicity and response to augmentation.

Popliteal Vein: No evidence of thrombus. Normal compressibility,
respiratory phasicity and response to augmentation.

Calf Veins: No evidence of thrombus. Normal compressibility and flow
on color Doppler imaging.

Superficial Great Saphenous Vein: No evidence of thrombus. Normal
compressibility and flow on color Doppler imaging.

Venous Reflux:  None.

Other Findings: Atherosclerotic calcifications at the popliteal
artery
IMPRESSION: No evidence of deep venous thrombosis in either lower extremity.

Atherosclerotic calcifications at the popliteal arteries
bilaterally.

## 2018-12-01 ENCOUNTER — Encounter: Payer: Self-pay | Admitting: Emergency Medicine

## 2018-12-01 ENCOUNTER — Ambulatory Visit (INDEPENDENT_AMBULATORY_CARE_PROVIDER_SITE_OTHER): Payer: Medicare Other | Admitting: Emergency Medicine

## 2018-12-01 VITALS — BP 114/72 | HR 60 | Ht 68.4 in | Wt 161.0 lb

## 2018-12-01 DIAGNOSIS — R05 Cough: Secondary | ICD-10-CM

## 2018-12-01 DIAGNOSIS — R911 Solitary pulmonary nodule: Secondary | ICD-10-CM | POA: Diagnosis not present

## 2018-12-01 DIAGNOSIS — J4520 Mild intermittent asthma, uncomplicated: Secondary | ICD-10-CM

## 2018-12-01 DIAGNOSIS — R059 Cough, unspecified: Secondary | ICD-10-CM

## 2018-12-01 NOTE — Assessment & Plan Note (Signed)
Based on clinical symptoms and very slight curve to his flow volume loop.  He has albuterol that he can use as needed

## 2018-12-01 NOTE — Assessment & Plan Note (Signed)
With associated mediastinal lymphadenopathy, mild hypermetabolism on PET scan.  He has gone to nondiagnostic bronchoscopies.  His most recent CT scan done on 10/23/2018 shows that the nodes and right lower lobe nodule are stable.  I think we can continue to follow him with serial scans, next in 6 months.

## 2018-12-01 NOTE — Progress Notes (Signed)
   Subjective:    Patient ID: TELESFORO BROSNAHAN, male    DOB: Dec 21, 1940, 78 y.o.   MRN: [id number]  HPI  ROV 12/01/18 --this is a follow-up visit for 78 year old man for an abnormal CT scan of the chest.  He is a never smoker.  He has had mediastinal lymphadenopathy and a right lower lobe groundglass opacity, negative endobronchial ultrasound August 2018 and then negative EBUS, ENB 03/20/2018.  Based on this I have been following him with serial imaging.  He underwent CT chest 10/23/2018 that I reviewed.  This shows stable biapical scarring, stable right lower lobe irregular opacity 1.3 x 3.1 cm, stable nodules along the major fissure and stable mediastinal adenopathy. He has some mild obstruction on spirometry, has albuterol available, rarely uses.    Review of Systems  Constitutional: Positive for unexpected weight change. Negative for fever.  HENT: Positive for congestion. Negative for dental problem, ear pain, nosebleeds, postnasal drip, rhinorrhea, sinus pressure, sneezing, sore throat and trouble swallowing.   Eyes: Negative for redness and itching.  Respiratory: Positive for cough and shortness of breath. Negative for chest tightness and wheezing.   Cardiovascular: Positive for palpitations. Negative for leg swelling.  Gastrointestinal: Negative for nausea and vomiting.  Genitourinary: Negative for dysuria.  Musculoskeletal: Negative for joint swelling.  Skin: Negative for rash.  Neurological: Negative for headaches.  Hematological: Does not bruise/bleed easily.  Psychiatric/Behavioral: Positive for dysphoric mood. The patient is not nervous/anxious.        Objective:   Physical Exam Vitals:   12/01/18 1631  BP: 114/72  Pulse: 60  SpO2: 99%  Weight: 161 lb (73 kg)  Height: 5' 8.4" (1.737 m)   Gen: Pleasant, thin man,  in no distress,  normal affect  ENT: No lesions,  mouth clear,  oropharynx clear, no postnasal drip  Neck: No JVD, no stridor  Lungs: No use of accessory  muscles, distant but clear  Cardiovascular: RRR, heart sounds normal, no murmur or gallops, no peripheral edema  Musculoskeletal: No deformities, no cyanosis or clubbing  Neuro: alert, mild resting tremor, nonfocal, no deficits noted  Skin: Warm, no lesions or rash      Assessment & Plan:  Pulmonary nodule, right With associated mediastinal lymphadenopathy, mild hypermetabolism on PET scan.  He has gone to nondiagnostic bronchoscopies.  His most recent CT scan done on 10/23/2018 shows that the nodes and right lower lobe nodule are stable.  I think we can continue to follow him with serial scans, next in 6 months.  Cough He is having some mucus production in the morning when he gets up.  No difficulty clearing it.  Could consider ramping up therapy for allergic rhinitis.  He is on lisinopril - although cough has never been a big problem for him in the past we may need to consider a change.   Mild intermittent asthma Based on clinical symptoms and very slight curve to his flow volume loop.  He has albuterol that he can use as needed  Baltazar Apo, MD, PhD 12/01/2018, 5:02 PM [facility] [phone] or if no answer [phone]

## 2018-12-01 NOTE — Assessment & Plan Note (Signed)
He is having some mucus production in the morning when he gets up.  No difficulty clearing it.  Could consider ramping up therapy for allergic rhinitis.  He is on lisinopril - although cough has never been a big problem for him in the past we may need to consider a change.

## 2018-12-01 NOTE — Patient Instructions (Signed)
Your CT scan of the chest shows that your lymph nodes and pulmonary nodules are stable in size and appearance.  This is good news. We will repeat your CT scan of the chest in June 2020 Please keep your albuterol available to use 2 puffs if needed for shortness of breath, chest tightness, wheezing, cough Follow with Dr Lamonte Sakai in 6 months or sooner if you have any problems

## 2018-12-23 DIAGNOSIS — C61 Malignant neoplasm of prostate: Secondary | ICD-10-CM | POA: Diagnosis not present

## 2018-12-26 DIAGNOSIS — I10 Essential (primary) hypertension: Secondary | ICD-10-CM | POA: Diagnosis not present

## 2018-12-26 DIAGNOSIS — J449 Chronic obstructive pulmonary disease, unspecified: Secondary | ICD-10-CM | POA: Diagnosis not present

## 2018-12-26 DIAGNOSIS — E039 Hypothyroidism, unspecified: Secondary | ICD-10-CM | POA: Diagnosis not present

## 2018-12-26 DIAGNOSIS — I2510 Atherosclerotic heart disease of native coronary artery without angina pectoris: Secondary | ICD-10-CM | POA: Diagnosis not present

## 2018-12-26 DIAGNOSIS — I252 Old myocardial infarction: Secondary | ICD-10-CM | POA: Diagnosis not present

## 2018-12-26 DIAGNOSIS — E785 Hyperlipidemia, unspecified: Secondary | ICD-10-CM | POA: Diagnosis not present

## 2019-03-03 DIAGNOSIS — M545 Low back pain: Secondary | ICD-10-CM | POA: Diagnosis not present

## 2019-03-03 DIAGNOSIS — M47818 Spondylosis without myelopathy or radiculopathy, sacral and sacrococcygeal region: Secondary | ICD-10-CM | POA: Diagnosis not present

## 2019-03-27 DIAGNOSIS — I252 Old myocardial infarction: Secondary | ICD-10-CM | POA: Diagnosis not present

## 2019-03-27 DIAGNOSIS — E039 Hypothyroidism, unspecified: Secondary | ICD-10-CM | POA: Diagnosis not present

## 2019-03-27 DIAGNOSIS — E785 Hyperlipidemia, unspecified: Secondary | ICD-10-CM | POA: Diagnosis not present

## 2019-03-27 DIAGNOSIS — M5416 Radiculopathy, lumbar region: Secondary | ICD-10-CM | POA: Diagnosis not present

## 2019-03-27 DIAGNOSIS — M1990 Unspecified osteoarthritis, unspecified site: Secondary | ICD-10-CM | POA: Diagnosis not present

## 2019-03-27 DIAGNOSIS — J449 Chronic obstructive pulmonary disease, unspecified: Secondary | ICD-10-CM | POA: Diagnosis not present

## 2019-03-27 DIAGNOSIS — I10 Essential (primary) hypertension: Secondary | ICD-10-CM | POA: Diagnosis not present

## 2019-03-27 DIAGNOSIS — I2510 Atherosclerotic heart disease of native coronary artery without angina pectoris: Secondary | ICD-10-CM | POA: Diagnosis not present

## 2019-03-31 DIAGNOSIS — N200 Calculus of kidney: Secondary | ICD-10-CM | POA: Diagnosis not present

## 2019-03-31 DIAGNOSIS — C61 Malignant neoplasm of prostate: Secondary | ICD-10-CM | POA: Diagnosis not present

## 2019-04-30 ENCOUNTER — Telehealth: Payer: Self-pay | Admitting: Emergency Medicine

## 2019-04-30 NOTE — Telephone Encounter (Signed)
Ct at [address] is closed today will have to call tomorrow

## 2019-05-01 NOTE — Telephone Encounter (Signed)
I have got this resc I will contact the patient

## 2019-05-21 ENCOUNTER — Other Ambulatory Visit: Payer: Medicare Other

## 2019-05-25 ENCOUNTER — Telehealth: Payer: Self-pay | Admitting: *Deleted

## 2019-05-25 NOTE — Telephone Encounter (Signed)

## 2019-05-26 ENCOUNTER — Ambulatory Visit (INDEPENDENT_AMBULATORY_CARE_PROVIDER_SITE_OTHER)
Admission: RE | Admit: 2019-05-26 | Discharge: 2019-05-26 | Disposition: A | Discharge status: Home / Self Care | Payer: Medicare Other | Source: Ambulatory Visit | Attending: Emergency Medicine | Admitting: Emergency Medicine

## 2019-05-26 ENCOUNTER — Other Ambulatory Visit: Payer: Self-pay

## 2019-05-26 DIAGNOSIS — R911 Solitary pulmonary nodule: Secondary | ICD-10-CM | POA: Diagnosis not present

## 2019-06-26 DIAGNOSIS — M5416 Radiculopathy, lumbar region: Secondary | ICD-10-CM | POA: Diagnosis not present

## 2019-06-26 DIAGNOSIS — M1990 Unspecified osteoarthritis, unspecified site: Secondary | ICD-10-CM | POA: Diagnosis not present

## 2019-06-26 DIAGNOSIS — E785 Hyperlipidemia, unspecified: Secondary | ICD-10-CM | POA: Diagnosis not present

## 2019-06-26 DIAGNOSIS — I10 Essential (primary) hypertension: Secondary | ICD-10-CM | POA: Diagnosis not present

## 2019-06-26 DIAGNOSIS — I252 Old myocardial infarction: Secondary | ICD-10-CM | POA: Diagnosis not present

## 2019-06-26 DIAGNOSIS — J449 Chronic obstructive pulmonary disease, unspecified: Secondary | ICD-10-CM | POA: Diagnosis not present

## 2019-06-26 DIAGNOSIS — E039 Hypothyroidism, unspecified: Secondary | ICD-10-CM | POA: Diagnosis not present

## 2019-06-26 DIAGNOSIS — I2510 Atherosclerotic heart disease of native coronary artery without angina pectoris: Secondary | ICD-10-CM | POA: Diagnosis not present

## 2019-06-26 DIAGNOSIS — R918 Other nonspecific abnormal finding of lung field: Secondary | ICD-10-CM | POA: Diagnosis not present

## 2019-06-30 DIAGNOSIS — C61 Malignant neoplasm of prostate: Secondary | ICD-10-CM | POA: Diagnosis not present

## 2019-09-25 DIAGNOSIS — I2510 Atherosclerotic heart disease of native coronary artery without angina pectoris: Secondary | ICD-10-CM | POA: Diagnosis not present

## 2019-09-25 DIAGNOSIS — M1990 Unspecified osteoarthritis, unspecified site: Secondary | ICD-10-CM | POA: Diagnosis not present

## 2019-09-25 DIAGNOSIS — M5416 Radiculopathy, lumbar region: Secondary | ICD-10-CM | POA: Diagnosis not present

## 2019-09-25 DIAGNOSIS — I10 Essential (primary) hypertension: Secondary | ICD-10-CM | POA: Diagnosis not present

## 2019-09-25 DIAGNOSIS — J449 Chronic obstructive pulmonary disease, unspecified: Secondary | ICD-10-CM | POA: Diagnosis not present

## 2019-09-25 DIAGNOSIS — E039 Hypothyroidism, unspecified: Secondary | ICD-10-CM | POA: Diagnosis not present

## 2019-09-25 DIAGNOSIS — E785 Hyperlipidemia, unspecified: Secondary | ICD-10-CM | POA: Diagnosis not present

## 2019-10-27 DIAGNOSIS — C61 Malignant neoplasm of prostate: Secondary | ICD-10-CM | POA: Diagnosis not present

## 2020-01-18 ENCOUNTER — Ambulatory Visit (INDEPENDENT_AMBULATORY_CARE_PROVIDER_SITE_OTHER): Payer: Medicare Other | Admitting: Emergency Medicine

## 2020-01-18 ENCOUNTER — Other Ambulatory Visit: Payer: Self-pay

## 2020-01-18 ENCOUNTER — Encounter: Payer: Self-pay | Admitting: Emergency Medicine

## 2020-01-18 VITALS — BP 148/82 | HR 55 | Temp 97.0°F | Ht 69.0 in | Wt 164.4 lb

## 2020-01-18 DIAGNOSIS — R911 Solitary pulmonary nodule: Secondary | ICD-10-CM

## 2020-01-18 DIAGNOSIS — R9389 Abnormal findings on diagnostic imaging of other specified body structures: Secondary | ICD-10-CM

## 2020-01-18 DIAGNOSIS — J4520 Mild intermittent asthma, uncomplicated: Secondary | ICD-10-CM | POA: Diagnosis not present

## 2020-01-18 NOTE — Assessment & Plan Note (Signed)
We have been following mediastinal adenopathy (negative EBUS twice) and a 3 cm right upper lobe irregular opacity that has been stable on his most recent serial CTs.  It is time for repeat scan now, last done was July 2020.  Follow-up with him to assess for interval change after it is done.  Based on stability we will decide whether any other attempts at biopsy are indicated.

## 2020-01-18 NOTE — Patient Instructions (Signed)
Keep albuterol available to use 2 puffs up to every 4 hours if needed for shortness of breath, chest tightness, wheezing.  We will plan to repeat your your CT scan of your chest now Follow with Dr Lamonte Sakai next available after your scan to review together.

## 2020-01-18 NOTE — Progress Notes (Signed)
   Subjective:    Patient ID: Gregory Scott, male    DOB: 09-27-1941, 79 y.o.   MRN: [id number]  HPI  ROV 12/01/18 --this is a follow-up visit for 79 year old man for an abnormal CT scan of the chest.  He is a never smoker.  He has had mediastinal lymphadenopathy and a right lower lobe groundglass opacity, negative endobronchial ultrasound August 2018 and then negative EBUS, ENB 03/20/2018.  Based on this I have been following him with serial imaging.  He underwent CT chest 10/23/2018 that I reviewed.  This shows stable biapical scarring, stable right lower lobe irregular opacity 1.3 x 3.1 cm, stable nodules along the major fissure and stable mediastinal adenopathy. He has some mild obstruction on spirometry, has albuterol available, rarely uses.   ROV 01/18/20 --Gregory Scott is 79, never smoker, with history of mediastinal lymphadenopathy and right lower lobe groundglass opacity.  He is undergone EBUS 07/08/2017 and again in 04/07/2018 which were both negative.  We have been following with serial imaging.  His most recent scan was 05/26/2019 which showed stability of a 3 cm irregularly shaped right lower lobe opacity, stable (to smaller) mediastinal lymphadenopathy. Denies any breathing changes, cough   Review of Systems  Constitutional: Negative for fever and unexpected weight change.  HENT: Positive for congestion. Negative for dental problem, ear pain, nosebleeds, postnasal drip, rhinorrhea, sinus pressure, sneezing, sore throat and trouble swallowing.   Eyes: Negative for redness and itching.  Respiratory: Negative for cough, chest tightness, shortness of breath and wheezing.   Cardiovascular: Negative for palpitations and leg swelling.  Gastrointestinal: Negative for nausea and vomiting.  Genitourinary: Negative for dysuria.  Musculoskeletal: Negative for joint swelling.  Skin: Negative for rash.  Neurological: Negative for headaches.  Hematological: Does not bruise/bleed easily.   Psychiatric/Behavioral: Negative for dysphoric mood. The patient is not nervous/anxious.        Objective:   Physical Exam Vitals:   01/18/20 1124  BP: (!) 148/82  Pulse: (!) 55  Temp: (!) 97 F (36.1 C)  TempSrc: Temporal  SpO2: 98%  Weight: 164 lb 6.4 oz (74.6 kg)  Height: 5\' 9"  (1.753 m)   Gen: Pleasant, thin man,  in no distress,  normal affect  ENT: No lesions,  mouth clear,  oropharynx clear, no postnasal drip  Neck: No JVD, no stridor  Lungs: No use of accessory muscles, distant but clear  Cardiovascular: RRR, heart sounds normal, no murmur or gallops, no peripheral edema  Musculoskeletal: No deformities, no cyanosis or clubbing  Neuro: alert, mild resting tremor, nonfocal, no deficits noted  Skin: Warm, no lesions or rash      Assessment & Plan:  Abnormal CT scan, chest We have been following mediastinal adenopathy (negative EBUS twice) and a 3 cm right upper lobe irregular opacity that has been stable on his most recent serial CTs.  It is time for repeat scan now, last done was July 2020.  Follow-up with him to assess for interval change after it is done.  Based on stability we will decide whether any other attempts at biopsy are indicated.  Mild intermittent asthma Continue his albuterol as needed.  He uses infrequently.  No flares.  Baltazar Apo, MD, PhD 01/18/2020, 11:41 AM [facility] [phone] or if no answer [phone]

## 2020-01-18 NOTE — Assessment & Plan Note (Signed)
Continue his albuterol as needed.  He uses infrequently.  No flares.

## 2020-01-21 ENCOUNTER — Other Ambulatory Visit: Payer: Self-pay

## 2020-01-21 ENCOUNTER — Ambulatory Visit
Admission: RE | Admit: 2020-01-21 | Discharge: 2020-01-21 | Disposition: A | Discharge status: Home / Self Care | Payer: Medicare Other | Source: Ambulatory Visit | Attending: Emergency Medicine | Admitting: Emergency Medicine

## 2020-01-21 DIAGNOSIS — R911 Solitary pulmonary nodule: Secondary | ICD-10-CM

## 2020-02-23 ENCOUNTER — Ambulatory Visit (INDEPENDENT_AMBULATORY_CARE_PROVIDER_SITE_OTHER): Payer: Medicare Other | Admitting: Emergency Medicine

## 2020-02-23 ENCOUNTER — Other Ambulatory Visit: Payer: Self-pay

## 2020-02-23 ENCOUNTER — Encounter: Payer: Self-pay | Admitting: Emergency Medicine

## 2020-02-23 VITALS — BP 132/66 | HR 60 | Temp 97.3°F | Ht 69.0 in | Wt 159.8 lb

## 2020-02-23 DIAGNOSIS — R590 Localized enlarged lymph nodes: Secondary | ICD-10-CM

## 2020-02-23 DIAGNOSIS — R911 Solitary pulmonary nodule: Secondary | ICD-10-CM

## 2020-02-23 NOTE — Assessment & Plan Note (Signed)
Right lower lobe pulmonary nodule is irregularly shaped, semisolid with some air bronchograms.  Overall stable back to November 2018.  His EBUS evaluations have been negative.  No enlarging lymphadenopathy on this scan.  Plan to follow for 5 years total given its semisolid properties.  Neck scan March 2022.

## 2020-02-23 NOTE — Patient Instructions (Signed)
We will plan to follow your CT scan of the chest and your small pulmonary nodule for 5 years total.  Next scan will be in March 2022. Follow with Dr. Lamonte Sakai in 1 year to review your CT results or sooner if you have any problems.

## 2020-02-23 NOTE — Progress Notes (Signed)
Subjective:    Patient ID: Gregory Scott, male    DOB: [birth year], 79 y.o.   MRN: [id number]  HPI  ROV 12/01/18 --this is a follow-up visit for 79 year old man for an abnormal CT scan of the chest.  He is a never smoker.  He has had mediastinal lymphadenopathy and a right lower lobe groundglass opacity, negative endobronchial ultrasound August 2018 and then negative EBUS, ENB 03/20/2018.  Based on this I have been following him with serial imaging.  He underwent CT chest 10/23/2018 that I reviewed.  This shows stable biapical scarring, stable right lower lobe irregular opacity 1.3 x 3.1 cm, stable nodules along the major fissure and stable mediastinal adenopathy. He has some mild obstruction on spirometry, has albuterol available, rarely uses.   ROV 01/18/20 --Gregory Scott is 79, never smoker, with history of mediastinal lymphadenopathy and right lower lobe groundglass opacity.  He is undergone EBUS 07/08/2017 and again in 04/07/2018 which were both negative.  We have been following with serial imaging.  His most recent scan was 05/26/2019 which showed stability of a 3 cm irregularly shaped right lower lobe opacity, stable (to smaller) mediastinal lymphadenopathy. Denies any breathing changes, cough  ROV 02/23/20 --follow-up visit for 79 year old never smoker with history of mediastinal lymphadenopathy, right lower lobe groundglass opacity.  This part of his evaluation he has undergone EBUS twice, most recently 04/07/2018.  All cytology is negative.  Following with serial imaging.  We repeated his CT scan of the chest on 01/21/2020 which I have reviewed.  This shows no significant change in 1.9 x 1.6 cm right lower lobe nodular opacity with air bronchograms, small scattered mediastinal lymph nodes that are stable.  CT scan is now been stable back to November 2018, over 3 years.  He denies any other problems.  Been doing well   Review of Systems  Constitutional: Negative for fever and unexpected weight  change.  HENT: Positive for congestion. Negative for dental problem, ear pain, nosebleeds, postnasal drip, rhinorrhea, sinus pressure, sneezing, sore throat and trouble swallowing.   Eyes: Negative for redness and itching.  Respiratory: Negative for cough, chest tightness, shortness of breath and wheezing.   Cardiovascular: Negative for palpitations and leg swelling.  Gastrointestinal: Negative for nausea and vomiting.  Genitourinary: Negative for dysuria.  Musculoskeletal: Negative for joint swelling.  Skin: Negative for rash.  Neurological: Negative for headaches.  Hematological: Does not bruise/bleed easily.  Psychiatric/Behavioral: Negative for dysphoric mood. The patient is not nervous/anxious.        Objective:   Physical Exam Vitals:   02/23/20 0846  BP: 132/66  Pulse: 60  Temp: (!) 97.3 F (36.3 C)  TempSrc: Temporal  SpO2: 100%  Weight: 159 lb 12.8 oz (72.5 kg)  Height: 5\' 9"  (1.753 m)   Gen: Pleasant, thin man,  in no distress,  normal affect  ENT: No lesions,  mouth clear,  oropharynx clear, no postnasal drip  Neck: No JVD, no stridor  Lungs: No use of accessory muscles, distant but clear  Cardiovascular: RRR, heart sounds normal, no murmur or gallops, no peripheral edema  Musculoskeletal: No deformities, no cyanosis or clubbing  Neuro: alert, mild resting tremor, nonfocal, no deficits noted  Skin: Warm, no lesions or rash      Assessment & Plan:  Pulmonary nodule, right Right lower lobe pulmonary nodule is irregularly shaped, semisolid with some air bronchograms.  Overall stable back to November 2018.  His EBUS evaluations have been negative.  No enlarging lymphadenopathy on  this scan.  Plan to follow for 5 years total given its semisolid properties.  Neck scan March 2022.  Baltazar Apo, MD, PhD 02/23/2020, 9:14 AM [facility] [phone] or if no answer [phone]

## 2020-10-17 ENCOUNTER — Other Ambulatory Visit: Payer: Self-pay

## 2020-10-17 ENCOUNTER — Observation Stay (HOSPITAL_COMMUNITY)
Admission: EM | Admit: 2020-10-17 | Discharge: 2020-10-18 | Disposition: A | Discharge status: Home / Self Care | Payer: Medicare Other | Attending: Internal Medicine | Admitting: Internal Medicine

## 2020-10-17 ENCOUNTER — Emergency Department (HOSPITAL_COMMUNITY): Payer: Medicare Other

## 2020-10-17 DIAGNOSIS — Z955 Presence of coronary angioplasty implant and graft: Secondary | ICD-10-CM | POA: Insufficient documentation

## 2020-10-17 DIAGNOSIS — Z7982 Long term (current) use of aspirin: Secondary | ICD-10-CM | POA: Insufficient documentation

## 2020-10-17 DIAGNOSIS — E039 Hypothyroidism, unspecified: Secondary | ICD-10-CM | POA: Diagnosis not present

## 2020-10-17 DIAGNOSIS — Z8546 Personal history of malignant neoplasm of prostate: Secondary | ICD-10-CM | POA: Diagnosis not present

## 2020-10-17 DIAGNOSIS — R079 Chest pain, unspecified: Secondary | ICD-10-CM | POA: Diagnosis present

## 2020-10-17 DIAGNOSIS — Z20822 Contact with and (suspected) exposure to covid-19: Secondary | ICD-10-CM | POA: Diagnosis not present

## 2020-10-17 DIAGNOSIS — E785 Hyperlipidemia, unspecified: Secondary | ICD-10-CM | POA: Diagnosis present

## 2020-10-17 DIAGNOSIS — I10 Essential (primary) hypertension: Secondary | ICD-10-CM | POA: Diagnosis present

## 2020-10-17 DIAGNOSIS — J4520 Mild intermittent asthma, uncomplicated: Secondary | ICD-10-CM | POA: Insufficient documentation

## 2020-10-17 DIAGNOSIS — R0789 Other chest pain: Secondary | ICD-10-CM | POA: Diagnosis not present

## 2020-10-17 DIAGNOSIS — K219 Gastro-esophageal reflux disease without esophagitis: Secondary | ICD-10-CM | POA: Diagnosis present

## 2020-10-17 DIAGNOSIS — I2510 Atherosclerotic heart disease of native coronary artery without angina pectoris: Secondary | ICD-10-CM | POA: Diagnosis present

## 2020-10-17 DIAGNOSIS — Z79899 Other long term (current) drug therapy: Secondary | ICD-10-CM | POA: Diagnosis not present

## 2020-10-17 DIAGNOSIS — Z85118 Personal history of other malignant neoplasm of bronchus and lung: Secondary | ICD-10-CM | POA: Insufficient documentation

## 2020-10-17 DIAGNOSIS — I119 Hypertensive heart disease without heart failure: Secondary | ICD-10-CM | POA: Insufficient documentation

## 2020-10-17 LAB — LIPID PANEL
Cholesterol: 167 mg/dL (ref 0–200)
HDL: 50 mg/dL
LDL Cholesterol: 92 mg/dL (ref 0–99)
Total CHOL/HDL Ratio: 3.3 ratio
Triglycerides: 123 mg/dL
VLDL: 25 mg/dL (ref 0–40)

## 2020-10-17 LAB — BASIC METABOLIC PANEL
Anion gap: 11 (ref 5–15)
BUN: 11 mg/dL (ref 8–23)
CO2: 20 mmol/L — ABNORMAL LOW (ref 22–32)
Calcium: 9.4 mg/dL (ref 8.9–10.3)
Chloride: 105 mmol/L (ref 98–111)
Creatinine, Ser: 0.95 mg/dL (ref 0.61–1.24)
GFR, Estimated: >60 mL/min (ref >60)
Glucose, Bld: 98 mg/dL (ref 70–99)
Potassium: 3.9 mmol/L (ref 3.5–5.1)
Sodium: 136 mmol/L (ref 135–145)

## 2020-10-17 LAB — CBC
HCT: 41 % (ref 39.0–52.0)
Hemoglobin: 14 g/dL (ref 13.0–17.0)
MCH: 33.2 pg (ref 26.0–34.0)
MCHC: 34.1 g/dL (ref 30.0–36.0)
MCV: 97.2 fL (ref 80.0–100.0)
Platelets: 186 K/uL (ref 150–400)
RBC: 4.22 MIL/uL (ref 4.22–5.81)
RDW: 11.8 % (ref 11.5–15.5)
WBC: 4.9 K/uL (ref 4.0–10.5)
nRBC: 0 % (ref 0.0–0.2)

## 2020-10-17 LAB — TROPONIN I (HIGH SENSITIVITY)
Troponin I (High Sensitivity): 5 ng/L (ref <18)
Troponin I (High Sensitivity): 4 ng/L (ref <18)

## 2020-10-17 MED ORDER — ROPINIROLE HCL 0.5 MG PO TABS
0.5000 mg | ORAL_TABLET | Freq: Every day | ORAL | Status: DC
  Administered 2020-10-17: 0.5 mg via ORAL
  Filled 2020-10-17: qty 1

## 2020-10-17 MED ORDER — LEVOTHYROXINE SODIUM 50 MCG PO TABS
50.0000 ug | ORAL_TABLET | Freq: Every day | ORAL | Status: DC
  Administered 2020-10-18: 50 ug via ORAL
  Filled 2020-10-17: qty 1

## 2020-10-17 MED ORDER — ASPIRIN EC 81 MG PO TBEC
81.0000 mg | DELAYED_RELEASE_TABLET | Freq: Every day | ORAL | Status: DC
  Administered 2020-10-18: 81 mg via ORAL
  Filled 2020-10-17: qty 1

## 2020-10-17 MED ORDER — ROSUVASTATIN CALCIUM 20 MG PO TABS
20.0000 mg | ORAL_TABLET | ORAL | Status: DC
  Administered 2020-10-17: 20 mg via ORAL
  Filled 2020-10-17: qty 1

## 2020-10-17 MED ORDER — ENOXAPARIN SODIUM 40 MG/0.4ML ~~LOC~~ SOLN
40.0000 mg | SUBCUTANEOUS | Status: DC
  Administered 2020-10-17: 40 mg via SUBCUTANEOUS
  Filled 2020-10-17: qty 0.4

## 2020-10-17 MED ORDER — ASPIRIN EC 81 MG PO TBEC: 81.0000 mg | DELAYED_RELEASE_TABLET | Freq: Every day | ORAL | Status: DC

## 2020-10-17 MED ORDER — NITROGLYCERIN 2 % TD OINT
0.5000 [in_us] | TOPICAL_OINTMENT | Freq: Once | TRANSDERMAL | Status: AC
  Administered 2020-10-17: 0.5 [in_us] via TOPICAL
  Filled 2020-10-17: qty 1

## 2020-10-17 MED ORDER — PANTOPRAZOLE SODIUM 40 MG PO TBEC
40.0000 mg | DELAYED_RELEASE_TABLET | Freq: Every day | ORAL | Status: DC
Start: 2020-10-17 — End: 2020-10-17

## 2020-10-17 MED ORDER — ALBUTEROL SULFATE HFA 108 (90 BASE) MCG/ACT IN AERS
1.0000 | INHALATION_SPRAY | Freq: Four times a day (QID) | RESPIRATORY_TRACT | Status: DC | PRN
  Filled 2020-10-17: qty 6.7

## 2020-10-17 MED ORDER — ACETAMINOPHEN 325 MG PO TABS
650.0000 mg | ORAL_TABLET | ORAL | Status: DC | PRN
  Administered 2020-10-17 – 2020-10-18 (×2): 650 mg via ORAL
  Filled 2020-10-17 (×2): qty 2

## 2020-10-17 MED ORDER — ACETAMINOPHEN-CODEINE #3 300-30 MG PO TABS
1.0000 | ORAL_TABLET | Freq: Two times a day (BID) | ORAL | Status: DC | PRN
  Administered 2020-10-17: 1 via ORAL
  Filled 2020-10-17: qty 1

## 2020-10-17 MED ORDER — ROSUVASTATIN CALCIUM 20 MG PO TABS: 20.0000 mg | ORAL_TABLET | Freq: Every day | ORAL | Status: DC

## 2020-10-17 MED ORDER — ONDANSETRON HCL 4 MG/2ML IJ SOLN: 4.0000 mg | Freq: Four times a day (QID) | INTRAMUSCULAR | Status: DC | PRN

## 2020-10-17 MED ORDER — LISINOPRIL 5 MG PO TABS
2.5000 mg | ORAL_TABLET | Freq: Two times a day (BID) | ORAL | Status: DC
  Administered 2020-10-17 – 2020-10-18 (×2): 2.5 mg via ORAL
  Filled 2020-10-17 (×2): qty 1

## 2020-10-17 MED ORDER — ROSUVASTATIN CALCIUM 20 MG PO TABS: 20.0000 mg | ORAL_TABLET | ORAL | Status: DC

## 2020-10-17 MED ORDER — PANTOPRAZOLE SODIUM 40 MG PO TBEC
40.0000 mg | DELAYED_RELEASE_TABLET | Freq: Every day | ORAL | Status: DC
  Administered 2020-10-17: 40 mg via ORAL
  Filled 2020-10-17: qty 1

## 2020-10-17 NOTE — H&P (Signed)
Date: 10/17/2020               Patient Name:  Gregory Scott MRN: [id number]  DOB: 09/11/1941 Age / Sex: 79 y.o., male   PCP: Bernell List, MD         Medical Service: Internal Medicine Teaching Service         Attending Physician: Dr. Lenice Pressman, MD    First Contact: Dr. Cato Mulligan, MD Pager: [phone]  Second Contact: Dr. Mitzi Hansen, MD Pager: [phone]       After Hours (After 5p/  First Contact Pager: [phone]  weekends / holidays): Second Contact Pager: [phone]   Chief Complaint: Chest pain  History of Present Illness: [name] is an 80 year old man with past medical history significant for MI (09/2015, s/p stent proximal LAD), CAD, HTN, HLD, GERD, hiatal hernia, mild intermittent asthma, hypothyroidism, prostate cancer (s/p radioactive seed implant) and pulmonary nodule who presented to [facility] on 10/17/2020 for evaluation of chest pain.  Patient reports that yesterday before noon, while working on his car, he had the onset of left breast and left scapula pain that was "not too bad" in severity. He reports that he is unable to describe the original pain as burning, pressure-like, aching, sharp or dull and reports that the pain was "just there." Following the onset of the pain, he continued working on the car and went about his normal daily routine without associated symptoms of nausea, vomiting, shortness of breath, abdominal pain, fevers, chills, cough, lightheadedness, dizziness, headaches or new rashes. This morning, upon awakening, he took his regular morning medications (levothyroxine, lisinopril and aspirin) and noticed worsening of his left breast and left scapula pain to an 8/10 in severity. He states that throughout today, the pain "feels like gas" and most similar to when he was diagnosed with a hiatal hernia, however his GERD usually causes right-sided chest pain. Throughout today, the pain has waxed and waned, worsened by deep breathing  and not improved by anything in particular. He was concerned about his symptoms and presented to the ED for evaluation. Since arrival to the ED, he states that his chest pain is persistent, a 5/10 in severity, and now substernal. Of note, he also reports being under significant stress recently as his wife who has a history of colorectal cancer was just found to have a concerning pulmonary nodule.  ED Course: On arrival to the ED, patient was hypertensive (175/99) but otherwise hemodynamically stable, saturating at 99% on room air. EKG showed normal sinus rhythm with LVH and no ischemic changes. Initial labs revealed troponin of 5 then 4; BMP revealed bicarbonate of 20; CBC unremarkable. CXR showed no acute cardiopulmonary abnormality. He received 324mg  of aspirin with EMS followed by nitroglycerin in ED and was admitted to IMTS for further evaluation and management of his condition.  Meds:  No current facility-administered medications on file prior to encounter.   Current Outpatient Medications on File Prior to Encounter  Medication Sig Dispense Refill  . acetaminophen-codeine (TYLENOL #3) 300-30 MG tablet Take 1 tablet by mouth 2 (two) times daily as needed for moderate pain.    Marland Kitchen aspirin EC 81 MG tablet Take 81 mg by mouth daily.    . Carboxymethylcellul-Glycerin (REFRESH OPTIVE) 1-0.9 % GEL Place 1 drop into both eyes at bedtime.     . docusate sodium (COLACE) 100 MG capsule Take 200 mg by mouth daily as needed for mild constipation.     Marland Kitchen levothyroxine (  SYNTHROID, LEVOTHROID) 50 MCG tablet Take 50 mcg by mouth daily before breakfast.     . lisinopril (PRINIVIL,ZESTRIL) 2.5 MG tablet Take 2.5 mg by mouth 2 (two) times daily.    . nitroGLYCERIN (NITROSTAT) 0.4 MG SL tablet Place 1 tablet (0.4 mg total) under the tongue every 5 (five) minutes x 3 doses as needed for chest pain. 25 tablet 0  . omeprazole (PRILOSEC) 20 MG capsule Take 20 mg by mouth 2 (two) times daily as needed (indigestion).     Vladimir Faster Glycol-Propyl Glycol (SYSTANE OP) Place 1 drop into both eyes 4 (four) times daily.    Marland Kitchen PROVENTIL HFA 108 (90 BASE) MCG/ACT inhaler Take 1 puff by mouth every 6 (six) hours as needed for wheezing or shortness of breath.     Marland Kitchen rOPINIRole (REQUIP) 0.25 MG tablet Take 0.5 mg by mouth at bedtime.    . rosuvastatin (CRESTOR) 20 MG tablet Take 20 mg by mouth daily.     Marland Kitchen tiZANidine (ZANAFLEX) 4 MG capsule Take 4 mg by mouth daily as needed for muscle spasms. Take 1/2 a pill at bedtime as needed     Allergies: Allergies as of 10/17/2020 - Review Complete 10/17/2020  Allergen Reaction Noted  . Statins Other (See Comments) 09/21/2015   Past Medical History:  Diagnosis Date  . Acute MI anterior wall first episode care ([place]) 09/21/2015  . Anemia   . Arthritis    back  . Coronary artery disease   . Dyspnea    "some"  . Emphysema lung ([place])    [facility]  . Enlarged lymph node   . GERD (gastroesophageal reflux disease)   . History of hiatal hernia   . History of kidney stones    2 lt kidney now  01/22/17  . Hypertension   . Hypothyroidism   . Lung cancer ([place])    small part in the right lower   . Pneumonia    as a child   . Prostate cancer ([place])   . PTSD (post-traumatic stress disorder)   . Sleep apnea    [place]-   [facility]    [place]  [facility]   Family History:  Family History  Problem Relation Age of Onset  . Coronary artery disease Mother 14  . Lung cancer Father    Social History:  Patient reports that he lives with his wife who has a history of colorectal cancer and recently found to have a pulmonary nodule. 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  Review of Systems: A complete ROS was negative except as per HPI.  Physical Exam: Blood pressure (!) 151/86, pulse 61, temperature 97.7 F (36.5 C), temperature source Oral,  resp. rate 18, SpO2 99 %. Physical Exam Vitals and nursing note reviewed.  Constitutional:      General: He is not in acute distress.    Appearance: He is well-developed.  HENT:     Head: Normocephalic and atraumatic.  Eyes:     Extraocular Movements: Extraocular movements intact.     Pupils: Pupils are equal, round, and reactive to light.  Cardiovascular:     Rate and Rhythm: Normal rate and regular rhythm.     Heart sounds: Normal heart sounds.  Pulmonary:     Effort: Pulmonary effort is normal.     Breath sounds:  Normal breath sounds.  Chest:     Chest wall: No deformity or tenderness.  Abdominal:     General: Bowel sounds are normal.     Palpations: Abdomen is soft.     Tenderness: There is no abdominal tenderness.  Musculoskeletal:        General: Normal range of motion.     Cervical back: Normal range of motion and neck supple.     Right lower leg: No edema.     Left lower leg: No edema.  Skin:    General: Skin is warm and dry.     Capillary Refill: Capillary refill takes less than 2 seconds.     Findings: No rash.  Neurological:     General: No focal deficit present.     Mental Status: He is alert and oriented to person, place, and time.     Motor: No weakness.     Comments: Continuous head tremor  Psychiatric:        Behavior: Behavior normal.     Comments: Tearful during conversation regarding his wife's health    EKG: personally reviewed my interpretation is normal sinus rhythm, no ischemic changes  CXR: personally reviewed my interpretation is no acute cardiopulmonary disease  Assessment & Plan by Problem: Mr. Aysen Shieh is an 79 year old man with past medical history significant for MI (09/2015, s/p stent proximal LAD), CAD, HTN, HLD, GERD, hiatal hernia, hypothyroidism, prostate cancer and pulmonary nodule who presented to [facility] on 10/17/2020 for evaluation of chest pain.  #Atypical chest pain, active #CAD, chronic #Prior myocardial infarction  s/p stent placement in proximal LAD in November 2016 Heart score of 4. Patient's history of chest pain over the past 26 hours is atypical in nature. Troponins negative and EKG revealing no ischemic changes, thereby making ACS unlikely. Alternative etiologies for patient's chest pain include GERD in the setting of his known hiatal hernia, as patient does report his chest pain today to feel most similar to this pain although he reports his GERD typically causes right-sided symptoms, not left-sided. Additionally, musculoskeletal chest pain, such as a muscle strain, remains a consideration given the patient had onset of pain while working on his car. No fevers, leukocytosis, cough or radiologic findings to suggest infectious process. No shortness of breath or radiologic findings to suggest acute pulmonary insult. No cutaneous findings to suggest herpes zoster and patient has been vaccinated.  -Admit for observation -Telemetry -Continue home aspirin 81mg  daily every morning (start tomorrow morning) -Echocardiogram -CBC, BMP tomorrow morning -Lipid Panel  #Hypertension, chronic Patient's blood pressure moderately elevated following presentation in the setting of reported adherence to prescribed antihypertensive regimen. -Continue home lisinopril 2.5mg  twice daily (start this evening)  #Hypothyroidism, chronic No recent TSH on record, although patient gets majority of care from providers not within our system of Care Everywhere. -Continue home levothyroxine 89mcg every morning (start tomorrow morning)  #Hyperlipidemia, chronic Patient reports only taking a statin medication three times weekly due to intolerance to daily dosing. -Continue home rosuvastain 20mg  nightly (takes Monday, Wednesday, Friday only)  #GERD, chronic #Hiatal hernia, chronic -Continue home omeprazole 20mg  twice daily  #Restless leg syndrome, chronic -Continue home ropinirole 0.5mg  nightly -Continue home cetaminophen-codeine  300-30mg  nightly  #Mild intermittent asthma, chronic -Continue home albuterol inhaler PRN  #VTE ppx: Lovenox #Code status: Full code #Bowel regimen: Miralax PRN #IVF: None #Diet: Heart healthy  Dispo: Admit patient to Observation with expected length of stay less than 2 midnights.  Signed: Cato Mulligan, MD 10/17/2020,  3:16 PM  Pager: [phone] After 5pm on weekdays and 1pm on weekends: On Call pager: [phone]

## 2020-10-17 NOTE — ED Provider Notes (Signed)
[facility] EMERGENCY DEPARTMENT Provider Note   CSN: [id number] Arrival date & time: 10/17/20  2500     History Chief Complaint  Patient presents with  . Chest Pain    Gregory Scott is a 79 y.o. 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  Patient presents to emergency department via EMS.  Patient complains of left-sided chest pain.  Chest pain began yesterday at 10 AM while he was lightly exerting himself working on a car.  Patient reports the pain came on slowly, has been constant since then with intermittent periods of worsening.  Patient describes the pain as an aching and rates it 5/10.  Patient denies any pain worse or better.  Patient denies any shortness of breath, nausea, vomiting, diaphoresis, lightheadedness, dizziness, syncope, palpitations, leg swelling or hemoptysis.  When asked patient reports this does not feel like his previous chest pain associated with MI in 2016.  Patient reports he took 1 nitro at home which made his chest pain worse.  Received 324 mg of aspirin with EMS.     HPI  HPI: A 79 year old patient with a history of hypertension and hypercholesterolemia presents for evaluation of chest pain. Initial onset of pain was more than 6 hours ago. The patient's chest pain is not worse with exertion. The patient's chest pain is middle- or left-sided, is not well-localized, is not described as heaviness/pressure/tightness, is not sharp and does not radiate to the arms/jaw/neck. The patient does not complain of nausea and denies diaphoresis. The patient has no history of stroke, has no history of peripheral artery disease, has not smoked in the past 90 days, denies any history of treated diabetes, has no relevant family history of coronary artery disease (first degree relative at less than age 59) and does not have an elevated BMI (>=30).   Past Medical  History:  Diagnosis Date  . Acute MI anterior wall first episode care ([place]) 09/21/2015  . Anemia   . Arthritis    back  . Coronary artery disease   . Dyspnea    "some"  . Emphysema lung ([place])    [facility]  [facility]  . Enlarged lymph node   . GERD (gastroesophageal reflux disease)   . History of hiatal hernia   . History of kidney stones    2 lt kidney now  01/22/17  . Hypertension   . Hypothyroidism   . Lung cancer ([place])    small part in the right lower   . Pneumonia    as a child   . Prostate cancer ([place])   . PTSD (post-traumatic stress disorder)   . Sleep apnea    sleep study-   [facility]ap    [place]  [facility]    Patient Active Problem List   Diagnosis Date Noted  . Cough 12/01/2018  . Mild intermittent asthma 12/01/2018  . Pulmonary nodule, right 06/24/2017  . Mediastinal lymphadenopathy   . Abnormal CT scan, chest 06/14/2017  . Dyspnea 06/14/2017  . Radiculopathy 01/30/2017  . Symptomatic anemia 11/16/2016  . CAD (coronary artery disease) 11/16/2016  . GERD (gastroesophageal reflux disease) 11/16/2016  . Hypothyroidism 11/16/2016  . Abdominal pain 11/16/2016  . ST elevation myocardial infarction (STEMI) ([place])   . Essential hypertension   . Hyperlipidemia   . Acute MI anterior wall first episode care ([place]) 09/21/2015  . Acute MI, anterolateral wall, initial episode of care [facility])  09/21/2015    Past Surgical History:  Procedure Laterality Date  . ABDOMINAL EXPOSURE N/A 01/30/2017   Procedure: ABDOMINAL EXPOSURE;  Surgeon: Rosetta Posner, MD;  Location: [place];  Service: Vascular;  Laterality: N/A;  . ANTERIOR LUMBAR FUSION Bilateral 01/30/2017   Procedure: LUMBAR 5-SACRAL 1 ANTERIOR INTERBODY FUSION LUMBAR;  Surgeon: Phylliss Bob, MD;  Location: [place];  Service: Orthopedics;  Laterality: Bilateral;  LUMBAR 5-SACRUM 1 ANTERIOR INTERBODY FUSION LUMBAR   . CARDIAC CATHETERIZATION N/A 09/21/2015   Procedure: Left Heart Cath and Coronary Angiography;  Surgeon: Sherren Mocha, MD;  Location: [place] CV LAB;  Service: Cardiovascular;  Laterality: N/A;  . CARDIAC CATHETERIZATION Right 09/21/2015   Procedure: Coronary Stent Intervention;  Surgeon: Sherren Mocha, MD;  Location: [place] CV LAB;  Service: Cardiovascular;  Laterality: Right;  . COLONOSCOPY    . knee surgery    . SHOULDER SURGERY    . TONSILLECTOMY    . VIDEO BRONCHOSCOPY WITH ENDOBRONCHIAL NAVIGATION Right 04/30/2018   Procedure: VIDEO BRONCHOSCOPY WITH ENDOBRONCHIAL NAVIGATION;  Surgeon: Collene Gobble, MD;  Location: [place];  Service: Thoracic;  Laterality: Right;  Marland Kitchen VIDEO BRONCHOSCOPY WITH ENDOBRONCHIAL ULTRASOUND N/A 06/19/2017   Procedure: VIDEO BRONCHOSCOPY WITH ENDOBRONCHIAL ULTRASOUND;  Surgeon: Collene Gobble, MD;  Location: [facility] OR;  Service: Thoracic;  Laterality: N/A;  . VIDEO BRONCHOSCOPY WITH ENDOBRONCHIAL ULTRASOUND Right 04/30/2018   Procedure: VIDEO BRONCHOSCOPY WITH ENDOBRONCHIAL ULTRASOUND;  Surgeon: Collene Gobble, MD;  Location: [facility] OR;  Service: Thoracic;  Laterality: Right;       Family History  Problem Relation Age of Onset  . Coronary artery disease Mother 67  . Lung cancer Father     Social History   Tobacco Use  . Smoking status: Never Smoker  . Smokeless tobacco: Never Used  Vaping Use  . Vaping Use: Never used  Substance Use Topics  . Alcohol use: No    Alcohol/week: 0.0 standard drinks  . Drug use: No    Home Medications Prior to Admission medications   Medication Sig Start Date End Date Taking? Authorizing Provider  acetaminophen-codeine (TYLENOL #3) 300-30 MG tablet Take 1 tablet by mouth 2 (two) times daily as needed for moderate pain.    [provider]  aspirin EC 81 MG tablet Take 81 mg by mouth daily.    [provider]  Carboxymethylcellul-Glycerin (REFRESH OPTIVE) 1-0.9 % GEL Place 1 drop into both eyes at bedtime.     [provider]  docusate sodium (COLACE) 100 MG capsule Take 200 mg by mouth daily as needed  for mild constipation.     [provider]  levothyroxine (SYNTHROID, LEVOTHROID) 50 MCG tablet Take 50 mcg by mouth daily before breakfast.     [provider]  lisinopril (PRINIVIL,ZESTRIL) 2.5 MG tablet Take 2.5 mg by mouth 2 (two) times daily.    [provider]  nitroGLYCERIN (NITROSTAT) 0.4 MG SL tablet Place 1 tablet (0.4 mg total) under the tongue every 5 (five) minutes x 3 doses as needed for chest pain. 09/23/15   Strader, Fransisco Hertz, PA-C  omeprazole (PRILOSEC) 20 MG capsule Take 20 mg by mouth 2 (two) times daily as needed (indigestion).     [provider]  Polyethyl Glycol-Propyl Glycol (SYSTANE OP) Place 1 drop into both eyes 4 (four) times daily.    [provider]  PROVENTIL HFA 108 (90 BASE) MCG/ACT inhaler Take 1 puff by mouth every 6 (six) hours as needed for wheezing or  shortness of breath.     [provider]  rOPINIRole (REQUIP) 0.25 MG tablet Take 0.5 mg by mouth at bedtime.    [provider]  rosuvastatin (CRESTOR) 20 MG tablet Take 20 mg by mouth daily.     [provider]  tiZANidine (ZANAFLEX) 4 MG capsule Take 4 mg by mouth daily as needed for muscle spasms. Take 1/2 a pill at bedtime as needed    [provider]    Allergies    Statins  Review of Systems   Review of Systems  Constitutional: Negative for chills, diaphoresis and fever.  Eyes: Negative for visual disturbance.  Respiratory: Positive for cough (pt reports baseline for d/t "COPD"). Negative for shortness of breath.        Negative for hemoptysis  Cardiovascular: Positive for chest pain (left sided with radiation to left shoulder ). Negative for palpitations and leg swelling.  Gastrointestinal: Negative for abdominal pain, nausea and vomiting.  Genitourinary: Negative for difficulty urinating and dysuria.  Musculoskeletal: Negative for back pain and neck pain.  Skin: Negative for color change and rash.  Neurological:  Negative for dizziness, syncope, light-headedness and headaches.  Psychiatric/Behavioral: Negative for confusion.    Physical Exam Updated Vital Signs BP (!) 151/86   Pulse 61   Temp 97.7 F (36.5 C) (Oral)   Resp 18   SpO2 99%   Physical Exam Constitutional:      General: He is not in acute distress.    Appearance: He is not ill-appearing, toxic-appearing or diaphoretic.  HENT:     Head: Normocephalic.  Neck:     Vascular: No JVD.  Cardiovascular:     Rate and Rhythm: Normal rate and regular rhythm.     Heart sounds: Normal heart sounds.  Pulmonary:     Effort: Pulmonary effort is normal. Prolonged expiration present. No respiratory distress.     Breath sounds: Normal breath sounds.  Chest:     Chest wall: No deformity, tenderness or crepitus.  Abdominal:     Palpations: Abdomen is soft.     Tenderness: There is no abdominal tenderness.  Musculoskeletal:     Right lower leg: No edema.     Left lower leg: No edema.  Skin:    General: Skin is warm and dry.  Neurological:     General: No focal deficit present.     Mental Status: He is alert.     Motor: Tremor (head, baseline per pt) present.     ED Results / Procedures / Treatments   Labs (all labs ordered are listed, but only abnormal results are displayed) Labs Reviewed  BASIC METABOLIC PANEL - Abnormal; Notable for the following components:      Result Value   CO2 20 (*)    All other components within normal limits  CBC  TROPONIN I (HIGH SENSITIVITY)  TROPONIN I (HIGH SENSITIVITY)    EKG EKG Interpretation  Date/Time:  Monday October 17 2020 08:29:47 EST Ventricular Rate:  63 PR Interval:    QRS Duration: 80 QT Interval:  399 QTC Calculation: 409 R Axis:   40 Text Interpretation: Sinus rhythm Consider left ventricular hypertrophy No significant change since 04/28/2018 Confirmed by Veryl Speak [phone]) on 10/17/2020 8:32:07 AM   Radiology DG Chest Portable 1 View  Result Date:  10/17/2020 CLINICAL DATA:  Chest pain EXAM: PORTABLE CHEST 1 VIEW COMPARISON:  04/30/2018 FINDINGS: Normal heart size. Hiatal hernia as cause of lower mediastinal widening. Chronic minimal reticulation. No acute infiltrate  or edema. No effusion or pneumothorax. No acute osseous findings. IMPRESSION: No evidence of active disease. Electronically Signed   By: Monte Fantasia M.D.   On: 10/17/2020 09:46    Procedures Procedures (including critical care time)  Medications Ordered in ED Medications  nitroGLYCERIN (NITROGLYN) 2 % ointment 0.5 inch (0.5 inches Topical Given 10/17/20 0905)    ED Course  I have reviewed the triage vital signs and the nursing notes.  Pertinent labs & imaging results that were available during my care of the patient were reviewed by me and considered in my medical decision making (see chart for details).    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  Chief complaint of left sided chest pain that radiates to his left shoulder.  Patient's pain began yesterday around 10 AM while he was lightly exerting himself, and has been constant since then with intermittent worsening.  Patient describes his pain as a ache and rated at 5 /10.  He reports his pain is worse with deep breaths.  It is not reproducible with palpation to the chest wall.  He denies any nausea, vomiting, diaphoresis, palpitations, leg swelling, lightheadedness, dizziness, syncope or hemoptysis.  Patient took 1 sublingual nitro his home was given through 24 mg of aspirin by EMS.  Per patient pain was made worse after taking nitro.  Patient denies any fever, chills, or recent illness.  On physical exam patient is in no acute distress.  Regular heart rate, and rhythm; no murmurs rubs or gallops; lungs  clear to auscultation prolonged expiration; no JVD or leg edema.  Noted to have a head tremor, per patient this is his baseline.    EKG showed sinus rhythm with no significant change from previous EKG. troponin, CBC, BMP and chest x-ray pending.    Due to patient's complaint history he will be worked up for ACS.  The emergent differential diagnosis of chest pain includes: Acute coronary syndrome, pericarditis, aortic dissection, pulmonary embolism, tension pneumothorax, pneumonia, and esophageal rupture.     Chest X-ray shows no evidence of active disease.  Independently reviewed, no signs of pneumothorax, widened mediastinum or infiltrates suggestive of pneumonia; I agree with the impression.    BMP, CMP both unremarkable.  Initial troponin 5, 2hr is 4.  Patient's heart score is 4, which indicates an elevated risk.  Patient's chest pain remains unchanged.  Patient will be brought in for observation.  Patient is amenable to this plan.    Consult was placed and spoke with hospatilist about admitting p   Final Clinical Impression(s) / ED Diagnoses Final diagnoses:  Nonspecific chest pain    Rx / DC Orders ED Discharge Orders    None       Loni Beckwith, PA-C 10/17/20 1525    Veryl Speak, MD 10/18/20 0800

## 2020-10-17 NOTE — ED Notes (Signed)
Patient eating dinner tray. 

## 2020-10-17 NOTE — ED Notes (Signed)
Pt resting in bed. Eating food.

## 2020-10-17 NOTE — ED Triage Notes (Signed)
Pt presents w/ c/o of substernal left sided CP w/ radiation to left scapula worsened by inspiration and sublingual NTG x 1 day. Pt with cardiac hx

## 2020-10-17 NOTE — ED Notes (Signed)
Dinner Tray Ordered @ 1716. 

## 2020-10-18 ENCOUNTER — Encounter (HOSPITAL_COMMUNITY): Payer: Self-pay | Admitting: Internal Medicine

## 2020-10-18 ENCOUNTER — Observation Stay (HOSPITAL_COMMUNITY): Payer: Medicare Other

## 2020-10-18 DIAGNOSIS — R0789 Other chest pain: Secondary | ICD-10-CM

## 2020-10-18 LAB — BASIC METABOLIC PANEL
Anion gap: 11 (ref 5–15)
BUN: 12 mg/dL (ref 8–23)
CO2: 23 mmol/L (ref 22–32)
Calcium: 9.3 mg/dL (ref 8.9–10.3)
Chloride: 103 mmol/L (ref 98–111)
Creatinine, Ser: 1.01 mg/dL (ref 0.61–1.24)
GFR, Estimated: >60 mL/min (ref >60)
Glucose, Bld: 94 mg/dL (ref 70–99)
Potassium: 3.5 mmol/L (ref 3.5–5.1)
Sodium: 137 mmol/L (ref 135–145)

## 2020-10-18 LAB — RESP PANEL BY RT-PCR (FLU A&B, COVID) ARPGX2
Influenza A by PCR: NEGATIVE
Influenza B by PCR: NEGATIVE
SARS Coronavirus 2 by RT PCR: NEGATIVE

## 2020-10-18 LAB — ECHOCARDIOGRAM COMPLETE
Area-P 1/2: 2.37 cm2
Height: 69.016 in
S' Lateral: 2.7 cm
Weight: 2557.34 [oz_av]

## 2020-10-18 LAB — CBC
HCT: 38.8 % — ABNORMAL LOW (ref 39.0–52.0)
Hemoglobin: 13.4 g/dL (ref 13.0–17.0)
MCH: 32.6 pg (ref 26.0–34.0)
MCHC: 34.5 g/dL (ref 30.0–36.0)
MCV: 94.4 fL (ref 80.0–100.0)
Platelets: 177 10*3/uL (ref 150–400)
RBC: 4.11 MIL/uL — ABNORMAL LOW (ref 4.22–5.81)
RDW: 11.6 % (ref 11.5–15.5)
WBC: 6.1 10*3/uL (ref 4.0–10.5)
nRBC: 0 % (ref 0.0–0.2)

## 2020-10-18 NOTE — Discharge Instructions (Signed)
[name],  It was a pleasure meeting you during your recent hospitalization. You were hospitalized because of chest pain that fortunately resolved overnight. Your vitals, laboratory workup, imaging, echocardiogram and EKG were all reassuring. We would like for you to continue with your prior home medication regimen and follow-up with your primary care physician following this hospitalization.  Sincerely, Dr. Paulla Dolly, MD

## 2020-10-18 NOTE — Progress Notes (Signed)
Subjective:   Overnight, no acute events.  This morning, patient reports that he feels well and denies any chest or back pain. He has a strong appetite and is looking forward to ordering breakfast. He wants to go home so that he can care for his wife and take her to her medical appointments tomorrow. He has no further questions or concerns.  Objective:  Vital signs in last 24 hours: Vitals:   10/18/20 0000 10/18/20 0100 10/18/20 0132 10/18/20 0530  BP: 120/71 134/73 139/87 132/78  Pulse: (!) 57 61 62 64  Resp: 14 17 18 18   Temp:   98.2 F (36.8 C) 98.2 F (36.8 C)  TempSrc:   Oral Oral  SpO2: 98% 98% 98% 98%  Weight:   72.5 kg   Height:   5' 9.02" (1.753 m)   SpO2: 98 %  Intake/Output Summary (Last 24 hours) at 10/18/2020 1102 Last data filed at 10/18/2020 0434 Gross per 24 hour  Intake 100 ml  Output 0 ml  Net 100 ml   Filed Weights   10/18/20 0132  Weight: 72.5 kg  Physical Exam Vitals and nursing note reviewed.  Constitutional:      General: He is not in acute distress.    Appearance: He is not toxic-appearing.  Neck:     Vascular: No JVD.  Cardiovascular:     Rate and Rhythm: Normal rate and regular rhythm.     Heart sounds: Normal heart sounds.  Pulmonary:     Effort: Pulmonary effort is normal. No tachypnea or respiratory distress.  Abdominal:     General: Bowel sounds are normal.     Palpations: Abdomen is soft.     Tenderness: There is no abdominal tenderness.  Musculoskeletal:     Right lower leg: No edema.     Left lower leg: No edema.  Skin:    General: Skin is warm and dry.     Capillary Refill: Capillary refill takes less than 2 seconds.  Neurological:     Mental Status: He is alert.    CBC Latest Ref Rng & Units 10/18/2020 10/17/2020 04/28/2018  WBC 4.0 - 10.5 K/uL 6.1 4.9 4.9  Hemoglobin 13.0 - 17.0 g/dL 13.4 14.0 15.0  Hematocrit 39 - 52 % 38.8(L) 41.0 43.3  Platelets 150 - 400 K/uL 177 186 204   BMP Latest Ref Rng & Units 10/18/2020  10/17/2020 04/28/2018  Glucose 70 - 99 mg/dL 94 98 99  BUN 8 - 23 mg/dL 12 11 7   Creatinine 0.61 - 1.24 mg/dL 1.01 0.95 1.05  Sodium 135 - 145 mmol/L 137 136 140  Potassium 3.5 - 5.1 mmol/L 3.5 3.9 4.1  Chloride 98 - 111 mmol/L 103 105 107  CO2 22 - 32 mmol/L 23 20(L) 26  Calcium 8.9 - 10.3 mg/dL 9.3 9.4 9.6   IMAGING: No results found.  Assessment/Plan:  Active Problems:   Chest pain  Gregory Scott is an 79 year old man with past medical history significant for MI (09/2015, s/p stent proximal LAD), CAD, HTN, HLD, GERD, hiatal hernia, hypothyroidism, prostate cancer and pulmonary nodule who presented to [facility] on 10/17/2020 for evaluation of chest pain.  #Atypical chest pain, resolved #CAD, chronic #Prior myocardial infarction s/p stent placement in proximal LAD in November 2016 Patient's chest pain resolved overnight without intervention. Review of labs, imaging, EKG, and telemetry all reassuring. Patient's chest pain possibly secondary to musculoskeletal pain versus GERD in the setting of his hiatal hernia. He looks forward to discharging  home with close outpatient follow-up. -Admitted for observation -Telemetry -Continue home aspirin 81mg  daily -Follow-up echocardiogram results  #Hypertension, chronic, stable Patient's blood pressure well-controlled this morning following resuming his home antihypertensive regimen. BMP reveals no electrolyte derangements. -Continue home lisinopril 2.5mg  twice daily  #Hypothyroidism, chronic No recent TSH on record, although patient gets majority of care from providers not within our system of Care Everywhere. -Continue home levothyroxine 38mcg every morning  #Hyperlipidemia, chronic Patient reports only taking a statin medication three times weekly due to intolerance to daily dosing. -Continue home rosuvastain 20mg  nightly  #GERD, chronic #Hiatal hernia, chronic -Continue home omeprazole 20mg  twice daily  #Restless leg  syndrome, chronic -Continue home ropinirole 0.5mg  nightly -Continue home cetaminophen-codeine 300-30mg  nightly  #Mild intermittent asthma, chronic -Continue home albuterol inhaler PRN  #VTE ppx: Lovenox #Code status: Full code #Bowel regimen: Miralax PRN #IVF: None #Diet: Heart healthy  Gregory Mulligan, MD 10/18/2020, 11:02 AM Pager: [phone] After 5pm on weekdays and 1pm on weekends: On Call pager [phone]

## 2020-10-18 NOTE — Plan of Care (Signed)

## 2020-10-18 NOTE — Discharge Summary (Signed)
Name: Gregory Scott MRN: [id number] DOB: 1941-03-06 79 y.o. PCP: Bernell List, MD  Date of Admission: 10/17/2020  8:26 AM Date of Discharge: 10/18/2020 Attending Physician: Oda Kilts, MD  Discharge Diagnosis: 1. Active Problems:   Chest pain  Discharge Medications: Allergies as of 10/18/2020      Reactions   Statins Other (See Comments)   MYALGIAS Legs hurt in high doses      Medication List    TAKE these medications   acetaminophen-codeine 300-30 MG tablet Commonly known as: TYLENOL #3 Take 2 tablets by mouth at bedtime.   aspirin EC 81 MG tablet Take 81 mg by mouth every morning.   Carboxymethylcellulose Sod PF 1 % Gel Place 1 drop into both eyes at bedtime as needed (dry eyes/irritation).   cycloSPORINE 0.05 % ophthalmic emulsion Commonly known as: RESTASIS Place 1 drop into both eyes every 12 (twelve) hours as needed (dry eyes/irritation).   fluocinonide cream 0.05 % Commonly known as: LIDEX Apply 1 application topically 2 (two) times daily as needed (itching).   levothyroxine 50 MCG tablet Commonly known as: SYNTHROID Take 50 mcg by mouth daily before breakfast.   lisinopril 5 MG tablet Commonly known as: ZESTRIL Take 5 mg by mouth every morning. What changed: Another medication with the same name was removed. Continue taking this medication, and follow the directions you see here.   nitroGLYCERIN 0.4 MG SL tablet Commonly known as: NITROSTAT Place 1 tablet (0.4 mg total) under the tongue every 5 (five) minutes x 3 doses as needed for chest pain.   omeprazole 20 MG capsule Commonly known as: PRILOSEC Take 20 mg by mouth daily as needed (heartburn/indigestion).   Proventil HFA 108 (90 Base) MCG/ACT inhaler Generic drug: albuterol Take 2 puffs by mouth every 6 (six) hours as needed for wheezing or shortness of breath.   rOPINIRole 0.5 MG tablet Commonly known as: REQUIP Take 0.5 mg by mouth at bedtime. What changed: Another  medication with the same name was removed. Continue taking this medication, and follow the directions you see here.   rosuvastatin 20 MG tablet Commonly known as: CRESTOR Take 20 mg by mouth every Monday, Wednesday, and Friday.   Systane Balance 0.6 % Soln Generic drug: Propylene Glycol Place 1 drop into both eyes 4 (four) times daily as needed (dry eyes/irritation).      Disposition and follow-up:   Mr.Gabriell D Leder was discharged from [facility] in Good condition.  At the hospital follow up visit please address:  1.  Chest pain: Patient was placed in observation overnight for chest pain. Patient's chest pain likely musculoskeletal versus related to GERD as his laboratory, imaging, and EKG were all unremarkable. Repeat echocardiogram obtained reveals EF of 55-60% with no regional wall motion abnormalities. Please assess status of patient's chest pain.  2.  Labs / imaging needed at time of follow-up: CBC, BMP  3.  Pending labs/ test needing follow-up: none  Follow-up Appointments:  Follow-up Information    Bernell List, MD. Schedule an appointment as soon as possible for a visit in 1 week(s).   Specialty: Internal Medicine Contact information: [place] [address] [phone]               Hospital Course by problem list:  #Atypical chest pain, resolved #CAD, chronic #Prior myocardial infarction s/p stentplacement inproximal LAD in November 2016 Patient presented with 24 hours of chest pain following working on his car. His chest pain  resolved overnight without intervention. Review of labs, imaging, EKG, and telemetry all reassuring. Patient's chest pain possibly secondary to musculoskeletal pain versus GERD in the setting of his hiatal hernia. He had repeat echocardiogram obtained which revealed EF of 55-60% with no regional wall motion abnormalities. He looked forward to discharging home with close outpatient  follow-up.  Discharge Vitals:   BP 132/78 (BP Location: Left Arm)   Pulse 64   Temp 98.2 F (36.8 C) (Oral)   Resp 18   Ht 5' 9.02" (1.753 m)   Wt 72.5 kg   SpO2 98%   BMI 23.59 kg/m   Pertinent Labs, Studies, and Procedures:  CBC Latest Ref Rng & Units 10/18/2020 10/17/2020 04/28/2018  WBC 4.0 - 10.5 K/uL 6.1 4.9 4.9  Hemoglobin 13.0 - 17.0 g/dL 13.4 14.0 15.0  Hematocrit 39 - 52 % 38.8(L) 41.0 43.3  Platelets 150 - 400 K/uL 177 186 204   CMP Latest Ref Rng & Units 10/18/2020 10/17/2020 04/28/2018  Glucose 70 - 99 mg/dL 94 98 99  BUN 8 - 23 mg/dL 12 11 7   Creatinine 0.61 - 1.24 mg/dL 1.01 0.95 1.05  Sodium 135 - 145 mmol/L 137 136 140  Potassium 3.5 - 5.1 mmol/L 3.5 3.9 4.1  Chloride 98 - 111 mmol/L 103 105 107  CO2 22 - 32 mmol/L 23 20(L) 26  Calcium 8.9 - 10.3 mg/dL 9.3 9.4 9.6  Total Protein 6.5 - 8.1 g/dL - - -  Total Bilirubin 0.3 - 1.2 mg/dL - - -  Alkaline Phos 38 - 126 U/L - - -  AST 15 - 41 U/L - - -  ALT 17 - 63 U/L - - -   DG Chest Portable 1 View  Result Date: 10/17/2020 CLINICAL DATA:  Chest pain EXAM: PORTABLE CHEST 1 VIEW COMPARISON:  04/30/2018 FINDINGS: Normal heart size. Hiatal hernia as cause of lower mediastinal widening. Chronic minimal reticulation. No acute infiltrate or edema. No effusion or pneumothorax. No acute osseous findings. IMPRESSION: No evidence of active disease. Electronically Signed   By: Monte Fantasia M.D.   On: 10/17/2020 09:46   ECHOCARDIOGRAM COMPLETE  Result Date: 10/18/2020    ECHOCARDIOGRAM REPORT   Patient Name:   Gregory Scott Date of Exam: 10/18/2020 Medical Rec #:  [id number]           Height:       69.0 in Accession #:    [id number]          Weight:       159.8 lb Date of Birth:  1941/08/20           BSA:          1.878 m Patient Age:    55 years            BP:           132/78 mmHg Patient Gender: M                   HR:           62 bpm. Exam Location:  Inpatient Procedure: 2D Echo, 3D Echo, Strain Analysis, Color  Doppler and Cardiac Doppler Indications:     Chest Pain 786.50 / R07.9  History:         Patient has prior history of Echocardiogram examinations, most                  recent 09/23/2015. CAD; Risk Factors:Hypertension and  Dyslipidemia. Hiatal hernia, thyroid disease. Past history of                  cancer.  Sonographer:     Darlina Sicilian RDCS Referring Phys:  Merrill Diagnosing Phys: Charolette Forward MD IMPRESSIONS  1. Left ventricular ejection fraction, by estimation, is 55 to 60%. The left ventricle has normal function. The left ventricle has no regional wall motion abnormalities. There is mild left ventricular hypertrophy. Left ventricular diastolic parameters were normal.  2. Right ventricular systolic function is normal. The right ventricular size is normal.  3. The mitral valve is normal in structure. Trivial mitral valve regurgitation.  4. The aortic valve is normal in structure. Aortic valve regurgitation is not visualized.  5. The inferior vena cava is normal in size with greater than 50% respiratory variability, suggesting right atrial pressure of 3 mmHg. FINDINGS  Left Ventricle: Left ventricular ejection fraction, by estimation, is 55 to 60%. The left ventricle has normal function. The left ventricle has no regional wall motion abnormalities. The left ventricular internal cavity size was normal in size. There is  mild left ventricular hypertrophy. Left ventricular diastolic parameters were normal. Right Ventricle: The right ventricular size is normal. No increase in right ventricular wall thickness. Right ventricular systolic function is normal. Left Atrium: Left atrial size was normal in size. Right Atrium: Right atrial size was normal in size. Pericardium: There is no evidence of pericardial effusion. Mitral Valve: The mitral valve is normal in structure. Trivial mitral valve regurgitation. Tricuspid Valve: The tricuspid valve is normal in structure. Tricuspid valve  regurgitation is not demonstrated. Aortic Valve: The aortic valve is normal in structure. Aortic valve regurgitation is not visualized. Pulmonic Valve: The pulmonic valve was normal in structure. Pulmonic valve regurgitation is trivial. Aorta: The aortic root is normal in size and structure. Venous: The inferior vena cava is normal in size with greater than 50% respiratory variability, suggesting right atrial pressure of 3 mmHg. IAS/Shunts: No atrial level shunt detected by color flow Doppler.  LEFT VENTRICLE PLAX 2D LVIDd:         3.90 cm  Diastology LVIDs:         2.70 cm  LV e' medial:    6.64 cm/s LV PW:         0.90 cm  LV E/e' medial:  8.1 LV IVS:        1.30 cm  LV e' lateral:   8.49 cm/s LVOT diam:     2.10 cm  LV E/e' lateral: 6.3 LV SV:         81 LV SV Index:   43 LVOT Area:     3.46 cm                          3D Volume EF:                         3D EF:        65 %                         LV EDV:       123 ml                         LV ESV:       44 ml  LV SV:        79 ml RIGHT VENTRICLE RV S prime:     14.70 cm/s TAPSE (M-mode): 2.0 cm LEFT ATRIUM             Index       RIGHT ATRIUM           Index LA diam:        3.00 cm 1.60 cm/m  RA Area:     11.40 cm LA Vol (A2C):   29.0 ml 15.44 ml/m RA Volume:   20.70 ml  11.02 ml/m LA Vol (A4C):   41.8 ml 22.25 ml/m LA Biplane Vol: 35.5 ml 18.90 ml/m  AORTIC VALVE LVOT Vmax:   112.00 cm/s LVOT Vmean:  71.200 cm/s LVOT VTI:    0.235 m  AORTA Ao Root diam: 2.80 cm Ao Asc diam:  2.60 cm MITRAL VALVE MV Area (PHT): 2.37 cm    SHUNTS MV Decel Time: 320 msec    Systemic VTI:  0.24 m MV E velocity: 53.60 cm/s  Systemic Diam: 2.10 cm MV A velocity: 50.60 cm/s MV E/A ratio:  1.06 Charolette Forward MD Electronically signed by Charolette Forward MD Signature Date/Time: 10/18/2020/3:09:19 PM    Final    Discharge Instructions: Discharge Instructions    Call MD for:  difficulty breathing, headache or visual disturbances   Complete by: As  directed    Call MD for:  extreme fatigue   Complete by: As directed    Call MD for:  hives   Complete by: As directed    Call MD for:  persistant dizziness or light-headedness   Complete by: As directed    Call MD for:  persistant nausea and vomiting   Complete by: As directed    Call MD for:  redness, tenderness, or signs of infection (pain, swelling, redness, odor or green/yellow discharge around incision site)   Complete by: As directed    Call MD for:  severe uncontrolled pain   Complete by: As directed    Call MD for:  temperature >100.4   Complete by: As directed    Diet - low sodium heart healthy   Complete by: As directed    Increase activity slowly   Complete by: As directed      Signed: Cato Mulligan, MD 10/18/2020, 3:39 PM   Pager: [phone]

## 2020-10-18 NOTE — Progress Notes (Signed)
  Echocardiogram 2D Echocardiogram with 3D and strain has been performed.  Darlina Sicilian M 10/18/2020, 8:09 AM

## 2020-11-21 DIAGNOSIS — C61 Malignant neoplasm of prostate: Secondary | ICD-10-CM | POA: Diagnosis not present

## 2020-12-20 DIAGNOSIS — M5416 Radiculopathy, lumbar region: Secondary | ICD-10-CM | POA: Diagnosis not present

## 2020-12-20 DIAGNOSIS — E785 Hyperlipidemia, unspecified: Secondary | ICD-10-CM | POA: Diagnosis not present

## 2020-12-20 DIAGNOSIS — I2510 Atherosclerotic heart disease of native coronary artery without angina pectoris: Secondary | ICD-10-CM | POA: Diagnosis not present

## 2020-12-20 DIAGNOSIS — I10 Essential (primary) hypertension: Secondary | ICD-10-CM | POA: Diagnosis not present

## 2020-12-20 DIAGNOSIS — J449 Chronic obstructive pulmonary disease, unspecified: Secondary | ICD-10-CM | POA: Diagnosis not present

## 2020-12-20 DIAGNOSIS — E039 Hypothyroidism, unspecified: Secondary | ICD-10-CM | POA: Diagnosis not present

## 2021-01-27 ENCOUNTER — Ambulatory Visit
Admission: RE | Admit: 2021-01-27 | Discharge: 2021-01-27 | Disposition: A | Discharge status: Home / Self Care | Payer: Medicare Other | Source: Ambulatory Visit | Attending: Emergency Medicine | Admitting: Emergency Medicine

## 2021-01-27 ENCOUNTER — Other Ambulatory Visit: Payer: Medicare Other

## 2021-01-27 ENCOUNTER — Other Ambulatory Visit: Payer: Self-pay

## 2021-01-27 DIAGNOSIS — R911 Solitary pulmonary nodule: Secondary | ICD-10-CM

## 2021-01-27 DIAGNOSIS — I2510 Atherosclerotic heart disease of native coronary artery without angina pectoris: Secondary | ICD-10-CM | POA: Diagnosis not present

## 2021-01-27 DIAGNOSIS — J438 Other emphysema: Secondary | ICD-10-CM | POA: Diagnosis not present

## 2021-01-27 DIAGNOSIS — J984 Other disorders of lung: Secondary | ICD-10-CM | POA: Diagnosis not present

## 2021-01-27 DIAGNOSIS — R590 Localized enlarged lymph nodes: Secondary | ICD-10-CM

## 2021-01-27 DIAGNOSIS — K449 Diaphragmatic hernia without obstruction or gangrene: Secondary | ICD-10-CM | POA: Diagnosis not present

## 2021-02-09 DIAGNOSIS — I10 Essential (primary) hypertension: Secondary | ICD-10-CM | POA: Diagnosis not present

## 2021-02-09 DIAGNOSIS — E785 Hyperlipidemia, unspecified: Secondary | ICD-10-CM | POA: Diagnosis not present

## 2021-02-09 DIAGNOSIS — G629 Polyneuropathy, unspecified: Secondary | ICD-10-CM | POA: Diagnosis not present

## 2021-02-09 DIAGNOSIS — C3491 Malignant neoplasm of unspecified part of right bronchus or lung: Secondary | ICD-10-CM | POA: Diagnosis not present

## 2021-02-22 ENCOUNTER — Other Ambulatory Visit: Payer: Self-pay

## 2021-02-22 ENCOUNTER — Ambulatory Visit (INDEPENDENT_AMBULATORY_CARE_PROVIDER_SITE_OTHER): Payer: Medicare Other | Admitting: Emergency Medicine

## 2021-02-22 ENCOUNTER — Encounter: Payer: Self-pay | Admitting: Emergency Medicine

## 2021-02-22 DIAGNOSIS — R911 Solitary pulmonary nodule: Secondary | ICD-10-CM | POA: Diagnosis not present

## 2021-02-22 NOTE — Progress Notes (Signed)
   Subjective:    Patient ID: Gregory Scott, male    DOB: 02-26-1941, 80 y.o.   MRN: [id number]  HPI  ROV 02/23/20 --follow-up visit for 80 year old never smoker with history of mediastinal lymphadenopathy, right lower lobe groundglass opacity.  This part of his evaluation he has undergone EBUS twice, most recently 04/07/2018.  All cytology is negative.  Following with serial imaging.  We repeated his CT scan of the chest on 01/21/2020 which I have reviewed.  This shows no significant change in 1.9 x 1.6 cm right lower lobe nodular opacity with air bronchograms, small scattered mediastinal lymph nodes that are stable.  CT scan is now been stable back to November 2018, over 3 years.  He denies any other problems.  Been doing well  ROV 02/22/21 --Gregory Scott is 79.  He is a never smoker who we have followed for mediastinal lymphadenopathy and around groundglass opacity in his right lower lobe.  We have performed endobronchial ultrasound twice with negative cytologies, have been following with serial imaging.  Most recent CT was done 01/27/2021 which I have reviewed, shows that the right lower lobe peripheral opacity remained stable in size and appearance, notably there has been some calcium accumulation.  Borderline enlarged mediastinal nodes have not changed.Now stable going back to 09/2017.  He feels well, no new meds or problems.    Review of Systems  Constitutional: Negative for fever and unexpected weight change.  HENT: Positive for congestion. Negative for dental problem, ear pain, nosebleeds, postnasal drip, rhinorrhea, sinus pressure, sneezing, sore throat and trouble swallowing.   Eyes: Negative for redness and itching.  Respiratory: Negative for cough, chest tightness, shortness of breath and wheezing.   Cardiovascular: Negative for palpitations and leg swelling.  Gastrointestinal: Negative for nausea and vomiting.  Genitourinary: Negative for dysuria.  Musculoskeletal: Negative for joint  swelling.  Skin: Negative for rash.  Neurological: Negative for headaches.  Hematological: Does not bruise/bleed easily.  Psychiatric/Behavioral: Negative for dysphoric mood. The patient is not nervous/anxious.        Objective:   Physical Exam Vitals:   02/22/21 0849  BP: 118/74  Pulse: 60  Temp: 97.7 F (36.5 C)  TempSrc: Temporal  SpO2: 99%  Weight: 162 lb 3.2 oz (73.6 kg)  Height: 5\' 9"  (1.753 m)   Gen: Pleasant, thin man,  in no distress,  normal affect  ENT: No lesions,  mouth clear,  oropharynx clear, no postnasal drip  Neck: No JVD, no stridor  Lungs: No use of accessory muscles, distant but clear  Cardiovascular: RRR, heart sounds normal, no murmur or gallops, no peripheral edema  Musculoskeletal: No deformities, no cyanosis or clubbing  Neuro: alert, mild resting tremor, nonfocal, no deficits noted  Skin: Warm, no lesions or rash      Assessment & Plan:  Pulmonary nodule, right Pulmonary nodule stable in size and appearance, now stability back to 09/2017.  We will perform 1 more CT scan in March 2023 to confirm 5 years of stability.  If no change at that time then we will not need to repeat unless there is a clinical change.  Discussed the results in full.  We will follow-up with him in 1 year after the CT.  Baltazar Apo, MD, PhD 02/22/2021, 9:14 AM [facility] [phone] or if no answer [phone]

## 2021-02-22 NOTE — Assessment & Plan Note (Signed)
Pulmonary nodule stable in size and appearance, now stability back to 09/2017.  We will perform 1 more CT scan in March 2023 to confirm 5 years of stability.  If no change at that time then we will not need to repeat unless there is a clinical change.  Discussed the results in full.  We will follow-up with him in 1 year after the CT.

## 2021-02-22 NOTE — Patient Instructions (Signed)
CT scan of the chest is stable compared with your priors.  This is great news.  You need one final CT scan in 1 year to confirm 5 years of stability. Please call our office if you have any problems. Follow with Dr. Lamonte Sakai in 1 year after your CT so that we can review the results together.

## 2021-02-22 NOTE — Addendum Note (Signed)
Addended by: Gavin Potters R on: 02/22/2021 09:52 AM   Modules accepted: Orders

## 2021-02-27 DIAGNOSIS — C61 Malignant neoplasm of prostate: Secondary | ICD-10-CM | POA: Diagnosis not present

## 2021-03-22 DIAGNOSIS — I2510 Atherosclerotic heart disease of native coronary artery without angina pectoris: Secondary | ICD-10-CM | POA: Diagnosis not present

## 2021-03-22 DIAGNOSIS — E039 Hypothyroidism, unspecified: Secondary | ICD-10-CM | POA: Diagnosis not present

## 2021-03-22 DIAGNOSIS — M5416 Radiculopathy, lumbar region: Secondary | ICD-10-CM | POA: Diagnosis not present

## 2021-03-22 DIAGNOSIS — J449 Chronic obstructive pulmonary disease, unspecified: Secondary | ICD-10-CM | POA: Diagnosis not present

## 2021-03-22 DIAGNOSIS — I10 Essential (primary) hypertension: Secondary | ICD-10-CM | POA: Diagnosis not present

## 2021-03-22 DIAGNOSIS — E785 Hyperlipidemia, unspecified: Secondary | ICD-10-CM | POA: Diagnosis not present

## 2021-05-29 DIAGNOSIS — C61 Malignant neoplasm of prostate: Secondary | ICD-10-CM | POA: Diagnosis not present

## 2021-07-26 DIAGNOSIS — M5416 Radiculopathy, lumbar region: Secondary | ICD-10-CM | POA: Diagnosis not present

## 2021-07-26 DIAGNOSIS — E785 Hyperlipidemia, unspecified: Secondary | ICD-10-CM | POA: Diagnosis not present

## 2021-07-26 DIAGNOSIS — E039 Hypothyroidism, unspecified: Secondary | ICD-10-CM | POA: Diagnosis not present

## 2021-07-26 DIAGNOSIS — I10 Essential (primary) hypertension: Secondary | ICD-10-CM | POA: Diagnosis not present

## 2021-07-26 DIAGNOSIS — J449 Chronic obstructive pulmonary disease, unspecified: Secondary | ICD-10-CM | POA: Diagnosis not present

## 2021-07-26 DIAGNOSIS — I2510 Atherosclerotic heart disease of native coronary artery without angina pectoris: Secondary | ICD-10-CM | POA: Diagnosis not present

## 2021-09-02 DIAGNOSIS — M542 Cervicalgia: Secondary | ICD-10-CM | POA: Diagnosis not present

## 2021-09-25 DIAGNOSIS — C61 Malignant neoplasm of prostate: Secondary | ICD-10-CM | POA: Diagnosis not present

## 2021-10-19 DIAGNOSIS — M542 Cervicalgia: Secondary | ICD-10-CM | POA: Diagnosis not present

## 2021-10-20 DIAGNOSIS — E785 Hyperlipidemia, unspecified: Secondary | ICD-10-CM | POA: Diagnosis not present

## 2021-10-20 DIAGNOSIS — I10 Essential (primary) hypertension: Secondary | ICD-10-CM | POA: Diagnosis not present

## 2021-10-20 DIAGNOSIS — I2510 Atherosclerotic heart disease of native coronary artery without angina pectoris: Secondary | ICD-10-CM | POA: Diagnosis not present

## 2021-10-25 DIAGNOSIS — M47812 Spondylosis without myelopathy or radiculopathy, cervical region: Secondary | ICD-10-CM | POA: Diagnosis not present

## 2021-10-25 DIAGNOSIS — M542 Cervicalgia: Secondary | ICD-10-CM | POA: Diagnosis not present

## 2021-10-26 DIAGNOSIS — Z20822 Contact with and (suspected) exposure to covid-19: Secondary | ICD-10-CM | POA: Diagnosis not present

## 2021-10-30 DIAGNOSIS — M542 Cervicalgia: Secondary | ICD-10-CM | POA: Diagnosis not present

## 2021-10-30 DIAGNOSIS — M47812 Spondylosis without myelopathy or radiculopathy, cervical region: Secondary | ICD-10-CM | POA: Diagnosis not present

## 2021-11-02 DIAGNOSIS — M542 Cervicalgia: Secondary | ICD-10-CM | POA: Diagnosis not present

## 2021-11-02 DIAGNOSIS — M47812 Spondylosis without myelopathy or radiculopathy, cervical region: Secondary | ICD-10-CM | POA: Diagnosis not present

## 2021-11-03 DIAGNOSIS — M542 Cervicalgia: Secondary | ICD-10-CM | POA: Diagnosis not present

## 2021-11-06 DIAGNOSIS — H35033 Hypertensive retinopathy, bilateral: Secondary | ICD-10-CM | POA: Diagnosis not present

## 2021-11-06 DIAGNOSIS — H43822 Vitreomacular adhesion, left eye: Secondary | ICD-10-CM | POA: Diagnosis not present

## 2021-11-06 DIAGNOSIS — H26493 Other secondary cataract, bilateral: Secondary | ICD-10-CM | POA: Diagnosis not present

## 2021-11-06 DIAGNOSIS — H04123 Dry eye syndrome of bilateral lacrimal glands: Secondary | ICD-10-CM | POA: Diagnosis not present

## 2021-11-07 DIAGNOSIS — M47812 Spondylosis without myelopathy or radiculopathy, cervical region: Secondary | ICD-10-CM | POA: Diagnosis not present

## 2021-11-07 DIAGNOSIS — M542 Cervicalgia: Secondary | ICD-10-CM | POA: Diagnosis not present

## 2021-11-09 DIAGNOSIS — M542 Cervicalgia: Secondary | ICD-10-CM | POA: Diagnosis not present

## 2021-11-09 DIAGNOSIS — M47812 Spondylosis without myelopathy or radiculopathy, cervical region: Secondary | ICD-10-CM | POA: Diagnosis not present

## 2021-11-14 DIAGNOSIS — M47812 Spondylosis without myelopathy or radiculopathy, cervical region: Secondary | ICD-10-CM | POA: Diagnosis not present

## 2021-11-14 DIAGNOSIS — M542 Cervicalgia: Secondary | ICD-10-CM | POA: Diagnosis not present

## 2021-11-16 DIAGNOSIS — M47812 Spondylosis without myelopathy or radiculopathy, cervical region: Secondary | ICD-10-CM | POA: Diagnosis not present

## 2021-11-16 DIAGNOSIS — M542 Cervicalgia: Secondary | ICD-10-CM | POA: Diagnosis not present

## 2021-11-21 DIAGNOSIS — M47812 Spondylosis without myelopathy or radiculopathy, cervical region: Secondary | ICD-10-CM | POA: Diagnosis not present

## 2021-11-21 DIAGNOSIS — M542 Cervicalgia: Secondary | ICD-10-CM | POA: Diagnosis not present

## 2021-11-23 DIAGNOSIS — M47812 Spondylosis without myelopathy or radiculopathy, cervical region: Secondary | ICD-10-CM | POA: Diagnosis not present

## 2021-11-23 DIAGNOSIS — M542 Cervicalgia: Secondary | ICD-10-CM | POA: Diagnosis not present

## 2021-11-28 DIAGNOSIS — M542 Cervicalgia: Secondary | ICD-10-CM | POA: Diagnosis not present

## 2021-11-28 DIAGNOSIS — M47812 Spondylosis without myelopathy or radiculopathy, cervical region: Secondary | ICD-10-CM | POA: Diagnosis not present

## 2021-11-30 DIAGNOSIS — M542 Cervicalgia: Secondary | ICD-10-CM | POA: Diagnosis not present

## 2021-11-30 DIAGNOSIS — M47812 Spondylosis without myelopathy or radiculopathy, cervical region: Secondary | ICD-10-CM | POA: Diagnosis not present

## 2021-12-05 DIAGNOSIS — M542 Cervicalgia: Secondary | ICD-10-CM | POA: Diagnosis not present

## 2021-12-05 DIAGNOSIS — M47812 Spondylosis without myelopathy or radiculopathy, cervical region: Secondary | ICD-10-CM | POA: Diagnosis not present

## 2021-12-07 DIAGNOSIS — M47812 Spondylosis without myelopathy or radiculopathy, cervical region: Secondary | ICD-10-CM | POA: Diagnosis not present

## 2021-12-07 DIAGNOSIS — M542 Cervicalgia: Secondary | ICD-10-CM | POA: Diagnosis not present

## 2021-12-12 DIAGNOSIS — M47812 Spondylosis without myelopathy or radiculopathy, cervical region: Secondary | ICD-10-CM | POA: Diagnosis not present

## 2021-12-12 DIAGNOSIS — M542 Cervicalgia: Secondary | ICD-10-CM | POA: Diagnosis not present

## 2021-12-14 DIAGNOSIS — M542 Cervicalgia: Secondary | ICD-10-CM | POA: Diagnosis not present

## 2021-12-14 DIAGNOSIS — M47812 Spondylosis without myelopathy or radiculopathy, cervical region: Secondary | ICD-10-CM | POA: Diagnosis not present

## 2021-12-19 DIAGNOSIS — M542 Cervicalgia: Secondary | ICD-10-CM | POA: Diagnosis not present

## 2021-12-19 DIAGNOSIS — M47812 Spondylosis without myelopathy or radiculopathy, cervical region: Secondary | ICD-10-CM | POA: Diagnosis not present

## 2021-12-20 DIAGNOSIS — H43811 Vitreous degeneration, right eye: Secondary | ICD-10-CM | POA: Diagnosis not present

## 2022-01-01 DIAGNOSIS — C61 Malignant neoplasm of prostate: Secondary | ICD-10-CM | POA: Diagnosis not present

## 2022-01-15 DIAGNOSIS — H43811 Vitreous degeneration, right eye: Secondary | ICD-10-CM | POA: Diagnosis not present

## 2022-01-15 DIAGNOSIS — Z20822 Contact with and (suspected) exposure to covid-19: Secondary | ICD-10-CM | POA: Diagnosis not present

## 2022-01-18 ENCOUNTER — Ambulatory Visit
Admission: RE | Admit: 2022-01-18 | Discharge: 2022-01-18 | Disposition: A | Discharge status: Home / Self Care | Payer: Medicare Other | Source: Ambulatory Visit | Attending: Emergency Medicine | Admitting: Emergency Medicine

## 2022-01-18 DIAGNOSIS — R911 Solitary pulmonary nodule: Secondary | ICD-10-CM | POA: Diagnosis not present

## 2022-01-19 DIAGNOSIS — I10 Essential (primary) hypertension: Secondary | ICD-10-CM | POA: Diagnosis not present

## 2022-01-19 DIAGNOSIS — E785 Hyperlipidemia, unspecified: Secondary | ICD-10-CM | POA: Diagnosis not present

## 2022-01-19 DIAGNOSIS — I25118 Atherosclerotic heart disease of native coronary artery with other forms of angina pectoris: Secondary | ICD-10-CM | POA: Diagnosis not present

## 2022-01-19 DIAGNOSIS — J449 Chronic obstructive pulmonary disease, unspecified: Secondary | ICD-10-CM | POA: Diagnosis not present

## 2022-01-19 DIAGNOSIS — E039 Hypothyroidism, unspecified: Secondary | ICD-10-CM | POA: Diagnosis not present

## 2022-02-14 ENCOUNTER — Other Ambulatory Visit: Payer: Self-pay

## 2022-02-14 ENCOUNTER — Ambulatory Visit (INDEPENDENT_AMBULATORY_CARE_PROVIDER_SITE_OTHER): Payer: Medicare Other | Admitting: Emergency Medicine

## 2022-02-14 ENCOUNTER — Encounter: Payer: Self-pay | Admitting: Emergency Medicine

## 2022-02-14 DIAGNOSIS — R9389 Abnormal findings on diagnostic imaging of other specified body structures: Secondary | ICD-10-CM

## 2022-02-14 NOTE — Progress Notes (Signed)
? ?  Subjective:  ? ? Patient ID: Gregory Scott, male    DOB: January 16, 1941, 81 y.o.   MRN: [id number] ? ?HPI ? ? ?ROV 02/22/21 --[name] is 79.  He is a never smoker who we have followed for mediastinal lymphadenopathy and around groundglass opacity in his right lower lobe.  We have performed endobronchial ultrasound twice with negative cytologies, have been following with serial imaging.  Most recent CT was done 01/27/2021 which I have reviewed, shows that the right lower lobe peripheral opacity remained stable in size and appearance, notably there has been some calcium accumulation.  Borderline enlarged mediastinal nodes have not changed.Now stable going back to 09/2017.  He feels well, no new meds or problems.  ? ? ?ROV 02/14/22 --follow-up visit for 81 year old gentleman, never smoker who has mediastinal lymphadenopathy and right lower lobe groundglass abnormalities on CT scan of the chest.  He has negative EBUS x2.  We have been following with serial imaging.  He returns today after repeat CT to confirm 5 years of stability. ?Has been breathing well. No cough. He is active, restores cars.  ? ?CT scan of the chest 01/18/2022 reviewed by me shows unchanged irregular right lower lobe nodular opacity.  No pathologically enlarged lymphadenopathy ? ? ?Review of Systems  ?Constitutional:  Negative for fever and unexpected weight change.  ?HENT:  Positive for congestion. Negative for dental problem, ear pain, nosebleeds, postnasal drip, rhinorrhea, sinus pressure, sneezing, sore throat and trouble swallowing.   ?Eyes:  Negative for redness and itching.  ?Respiratory:  Negative for cough, chest tightness, shortness of breath and wheezing.   ?Cardiovascular:  Negative for palpitations and leg swelling.  ?Gastrointestinal:  Negative for nausea and vomiting.  ?Genitourinary:  Negative for dysuria.  ?Musculoskeletal:  Negative for joint swelling.  ?Skin:  Negative for rash.  ?Neurological:  Negative for headaches.   ?Hematological:  Does not bruise/bleed easily.  ?Psychiatric/Behavioral:  Negative for dysphoric mood. The patient is not nervous/anxious.   ? ?   ?Objective:  ? Physical Exam ?Vitals:  ? 02/14/22 1145  ?BP: 120/78  ?Pulse: 61  ?Temp: 98.1 ?F (36.7 ?C)  ?TempSrc: Oral  ?SpO2: 97%  ?Weight: 153 lb (69.4 kg)  ?Height: 5\' 9"  (1.753 m)  ? ?Gen: Pleasant, thin man,  in no distress,  normal affect ? ?ENT: No lesions,  mouth clear,  oropharynx clear, no postnasal drip ? ?Neck: No JVD, no stridor ? ?Lungs: No use of accessory muscles, distant but clear ? ?Cardiovascular: RRR, heart sounds normal, no murmur or gallops, no peripheral edema ? ?Musculoskeletal: No deformities, no cyanosis or clubbing ? ?Neuro: alert, mild resting tremor, nonfocal, no deficits noted ? ?Skin: Warm, no lesions or rash ? ? ?   ?Assessment & Plan:  ?Abnormal CT scan, chest ?His mediastinal adenopathy is less prominent.  The right lower lobe nodular opacity is unchanged.  Its been stable for 5 years.  He did not need any more serial imaging last is a clinical change. ? ?We reviewed your CT scan of the chest today.  The small nodule in question has been stable for 5 years.  This is good news.  Consistent with a benign nodule.  You do not need to have any more follow-up CT scans unless there is a clinical change. ?Follow with Dr. Lamonte Sakai if needed ? ?Baltazar Apo, MD, PhD ?02/14/2022, 12:05 PM ?[facility] ?[phone] or if no answer [phone] ? ?

## 2022-02-14 NOTE — Patient Instructions (Signed)
We reviewed your CT scan of the chest today.  The small nodule in question has been stable for 5 years.  This is good news.  Consistent with a benign nodule.  You do not need to have any more follow-up CT scans unless there is a clinical change. ?Follow with Dr. Lamonte Sakai if needed ?

## 2022-02-14 NOTE — Assessment & Plan Note (Signed)
His mediastinal adenopathy is less prominent.  The right lower lobe nodular opacity is unchanged.  Its been stable for 5 years.  He did not need any more serial imaging last is a clinical change. ? ?We reviewed your CT scan of the chest today.  The small nodule in question has been stable for 5 years.  This is good news.  Consistent with a benign nodule.  You do not need to have any more follow-up CT scans unless there is a clinical change. ?Follow with Dr. Lamonte Sakai if needed ?

## 2022-02-22 DIAGNOSIS — Z20822 Contact with and (suspected) exposure to covid-19: Secondary | ICD-10-CM | POA: Diagnosis not present

## 2022-03-05 DIAGNOSIS — I10 Essential (primary) hypertension: Secondary | ICD-10-CM | POA: Diagnosis not present

## 2022-03-05 DIAGNOSIS — E785 Hyperlipidemia, unspecified: Secondary | ICD-10-CM | POA: Diagnosis not present

## 2022-03-05 DIAGNOSIS — E039 Hypothyroidism, unspecified: Secondary | ICD-10-CM | POA: Diagnosis not present

## 2022-03-05 DIAGNOSIS — Z20822 Contact with and (suspected) exposure to covid-19: Secondary | ICD-10-CM | POA: Diagnosis not present

## 2022-03-26 DIAGNOSIS — Z20822 Contact with and (suspected) exposure to covid-19: Secondary | ICD-10-CM | POA: Diagnosis not present

## 2022-04-09 DIAGNOSIS — C61 Malignant neoplasm of prostate: Secondary | ICD-10-CM | POA: Diagnosis not present

## 2022-04-20 DIAGNOSIS — E785 Hyperlipidemia, unspecified: Secondary | ICD-10-CM | POA: Diagnosis not present

## 2022-04-20 DIAGNOSIS — I25118 Atherosclerotic heart disease of native coronary artery with other forms of angina pectoris: Secondary | ICD-10-CM | POA: Diagnosis not present

## 2022-04-20 DIAGNOSIS — E039 Hypothyroidism, unspecified: Secondary | ICD-10-CM | POA: Diagnosis not present

## 2022-04-20 DIAGNOSIS — I10 Essential (primary) hypertension: Secondary | ICD-10-CM | POA: Diagnosis not present

## 2022-05-02 ENCOUNTER — Ambulatory Visit: Payer: Medicare Other | Admitting: Neurology

## 2022-06-12 ENCOUNTER — Encounter: Payer: Self-pay | Admitting: Neurology

## 2022-06-12 ENCOUNTER — Ambulatory Visit (INDEPENDENT_AMBULATORY_CARE_PROVIDER_SITE_OTHER): Payer: Medicare Other | Admitting: Neurology

## 2022-06-12 ENCOUNTER — Ambulatory Visit: Payer: Medicare Other | Admitting: Neurology

## 2022-06-12 VITALS — BP 154/86 | HR 61 | Ht 69.0 in | Wt 155.0 lb

## 2022-06-12 DIAGNOSIS — G4730 Sleep apnea, unspecified: Secondary | ICD-10-CM | POA: Insufficient documentation

## 2022-06-12 DIAGNOSIS — M542 Cervicalgia: Secondary | ICD-10-CM

## 2022-06-12 DIAGNOSIS — R269 Unspecified abnormalities of gait and mobility: Secondary | ICD-10-CM

## 2022-06-12 DIAGNOSIS — R251 Tremor, unspecified: Secondary | ICD-10-CM | POA: Insufficient documentation

## 2022-06-12 DIAGNOSIS — G2581 Restless legs syndrome: Secondary | ICD-10-CM | POA: Insufficient documentation

## 2022-06-12 NOTE — Progress Notes (Signed)
Chief Complaint  Patient presents with   New Patient (Initial Visit)    Rm 15. Alone. NP/[facility] [name] PA/tremor.      ASSESSMENT AND PLAN  Gregory Scott is a 81 y.o. 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   MEDICAL HISTORY:  Gregory Scott is a 81 year old male, seen in request by his primary care PA Jenean Lindau, [place] for evaluation of tremor, gait abnormality,  I reviewed and summarized the referring note.PMHX. Chronic low back pain. Hypothyroidism' HLD HTN CAD Emphysema History of Kidney stone, PTSD,  OSA--could not tolerate CPAP  Reported more than 15 years history of gradual onset head titubation, only mild hand tremor, no limitation on his daily activities, he likes to work on his cars, and yard, he denies a family history of tremor  He complains of long history of chronic low back pain, getting worse over the past few years, now often feel bilateral lower extremity numbness achy pain, from knee down, frequent bilateral lower extremity muscle cramping, leg feels cold, sometimes also have hands muscle cramp, at night, sometimes has difficulty finding a comfortable position, difficulty sleeping because of that  He was diagnosed with restless leg syndrome, taking Requip 0.5 mg seems to help him some, previously tried gabapentin and Lyrica did not help  PHYSICAL EXAM:   Vitals:   06/12/22 1603  BP: (!) 154/86  Pulse: 61  Weight: 155 lb (70.3 kg)  Height: 5\' 9"  (1.753 m)   Not recorded     Body mass index is 22.89 kg/m.  PHYSICAL  EXAMNIATION:  Gen: NAD, conversant, well nourised, well groomed                     Cardiovascular: Regular rate rhythm, no peripheral edema, warm, nontender. Eyes: Conjunctivae clear without exudates or hemorrhage Neck: Supple, no carotid bruits. Pulmonary: Clear to auscultation bilaterally   NEUROLOGICAL EXAM:  MENTAL STATUS: Speech/cognition: Constant head titubation, Awake, alert, oriented to history taking and casual conversation CRANIAL NERVES: CN II: Visual fields are full to confrontation. Pupils are round equal and briskly reactive to light. CN III, IV, VI: extraocular movement are normal. No ptosis. CN V: Facial sensation is intact to light touch CN VII: Face is symmetric with normal eye closure  CN VIII: Hearing is normal to causal conversation. CN IX, X: Phonation is normal. CN XI: Head turning and shoulder shrug are intact  MOTOR: Mild bilateral hand postural tremor, mild bilateral shoulder abduction weakness,  REFLEXES: Reflexes are 2+ and symmetric at the biceps, triceps, 3/3 knees, and ankles. Plantar responses are extensor bilaterally  SENSORY: Intact to light touch, pinprick and vibratory sensation are intact in fingers and toes.  COORDINATION: There is no trunk or limb dysmetria noted.  GAIT/STANCE: He can get up from seated position arm crossed, stiff gait, cautious, mildly unsteady, negative Romberg signs, able to stand up on heels and tiptoe  REVIEW OF SYSTEMS:  Full 14 system review of systems performed and notable only for as above All other review of systems were negative.   ALLERGIES: Allergies  Allergen Reactions   Statins Other (See Comments)  MYALGIAS Legs hurt in high doses    HOME MEDICATIONS: Current Outpatient Medications  Medication Sig Dispense Refill   acetaminophen-codeine (TYLENOL #3) 300-30 MG tablet Take 2 tablets by mouth at bedtime.     aspirin EC 81 MG tablet Take 81 mg by mouth every morning.       Carboxymethylcellulose Sod PF 1 % GEL Place 1 drop into both eyes at bedtime as needed (dry eyes/irritation).     cycloSPORINE (RESTASIS) 0.05 % ophthalmic emulsion Place 1 drop into both eyes every 12 (twelve) hours as needed (dry eyes/irritation).     fluocinonide cream (LIDEX) 3.33 % Apply 1 application topically 2 (two) times daily as needed (itching).     levothyroxine (SYNTHROID, LEVOTHROID) 50 MCG tablet Take 75 mcg by mouth daily before breakfast.     lisinopril (ZESTRIL) 10 MG tablet Take 10 mg by mouth every morning.     nitroGLYCERIN (NITROSTAT) 0.4 MG SL tablet Place 1 tablet (0.4 mg total) under the tongue every 5 (five) minutes x 3 doses as needed for chest pain. 25 tablet 0   omeprazole (PRILOSEC) 20 MG capsule Take 20 mg by mouth daily as needed (heartburn/indigestion).      Propylene Glycol (SYSTANE BALANCE) 0.6 % SOLN Place 1 drop into both eyes 4 (four) times daily as needed (dry eyes/irritation).     PROVENTIL HFA 108 (90 BASE) MCG/ACT inhaler Take 2 puffs by mouth every 6 (six) hours as needed for wheezing or shortness of breath.      rOPINIRole (REQUIP) 0.5 MG tablet Take 0.5 mg by mouth at bedtime.     rosuvastatin (CRESTOR) 20 MG tablet Take 20 mg by mouth every Monday, Wednesday, and Friday.      No current facility-administered medications for this visit.    PAST MEDICAL HISTORY: Past Medical History:  Diagnosis Date   Acute MI anterior wall first episode care ([place]) 09/21/2015   Anemia    Arthritis    back   Coronary artery disease    Dyspnea    "some"   Emphysema lung (HCC)    [facility]   Enlarged lymph node    GERD (gastroesophageal reflux disease)    History of hiatal hernia    History of kidney stones    2 lt kidney now  01/22/17   Hypertension    Hypothyroidism    Lung cancer ([place])    small part in the right lower    Pneumonia    as a child    Prostate cancer ([place])    PTSD (post-traumatic stress disorder)    Sleep apnea    sleep  study-   cpap    [place]  [facility]    PAST SURGICAL HISTORY: Past Surgical History:  Procedure Laterality Date   ABDOMINAL EXPOSURE N/A 01/30/2017   Procedure: ABDOMINAL EXPOSURE;  Surgeon: Rosetta Posner, MD;  Location: [place];  Service: Vascular;  Laterality: N/A;   ANTERIOR LUMBAR FUSION Bilateral 01/30/2017   Procedure: LUMBAR 5-SACRAL 1 ANTERIOR INTERBODY FUSION LUMBAR;  Surgeon: Phylliss Bob, MD;  Location: [address];  Service: Orthopedics;  Laterality: Bilateral;  LUMBAR 5-SACRUM 1 ANTERIOR INTERBODY FUSION LUMBAR    CARDIAC CATHETERIZATION N/A 09/21/2015   Procedure: Left Heart Cath and Coronary Angiography;  Surgeon: Sherren Mocha, MD;  Location: [place] [facility];  Service: Cardiovascular;  Laterality: N/A;   CARDIAC CATHETERIZATION Right 09/21/2015   Procedure: Coronary Stent Intervention;  Surgeon: Sherren Mocha, MD;  Location: [facility]  INVASIVE [facility];  Service: Cardiovascular;  Laterality: Right;   COLONOSCOPY     knee surgery     SHOULDER SURGERY     TONSILLECTOMY     VIDEO BRONCHOSCOPY WITH ENDOBRONCHIAL NAVIGATION Right 04/30/2018   Procedure: VIDEO BRONCHOSCOPY WITH ENDOBRONCHIAL NAVIGATION;  Surgeon: Collene Gobble, MD;  Location: [facility];  Service: Thoracic;  Laterality: Right;   VIDEO BRONCHOSCOPY WITH ENDOBRONCHIAL ULTRASOUND N/A 06/19/2017   Procedure: VIDEO BRONCHOSCOPY WITH ENDOBRONCHIAL ULTRASOUND;  Surgeon: Collene Gobble, MD;  Location: [facility];  Service: Thoracic;  Laterality: N/A;   VIDEO BRONCHOSCOPY WITH ENDOBRONCHIAL ULTRASOUND Right 04/30/2018   Procedure: VIDEO BRONCHOSCOPY WITH ENDOBRONCHIAL ULTRASOUND;  Surgeon: Collene Gobble, MD;  Location: [facility];  Service: Thoracic;  Laterality: Right;    FAMILY HISTORY: Family History  Problem Relation Age of Onset   Coronary artery disease Mother 55   Lung cancer Father     SOCIAL HISTORY: Social History   Socioeconomic History   Marital status: Married    Spouse name: Not on file   Number of children: Not on file   Years  of education: Not on file   Highest education level: Not on file  Occupational History   Occupation: retired  Tobacco Use   Smoking status: Never   Smokeless tobacco: Never  Vaping Use   Vaping Use: Never used  Substance and Sexual Activity   Alcohol use: No    Alcohol/week: 0.0 standard drinks of alcohol   Drug use: No   Sexual activity: Not on file  Other Topics Concern   Not on file  Social History Narrative   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 Ph.D.  [facility] [address] [place], [place] [address] Ph: [phone] Fax: [phone]  CC:  Linden Dolin, PA-C No address on file  Linden Dolin, [place]

## 2022-06-13 ENCOUNTER — Telehealth: Payer: Self-pay | Admitting: Neurology

## 2022-06-13 NOTE — Telephone Encounter (Signed)
medicare/BCBS fed/[facility] NPR sent to [facility]

## 2022-06-16 ENCOUNTER — Ambulatory Visit
Admission: RE | Admit: 2022-06-16 | Discharge: 2022-06-16 | Disposition: A | Discharge status: Home / Self Care | Payer: Medicare Other | Source: Ambulatory Visit | Attending: Neurology | Admitting: Neurology

## 2022-06-16 DIAGNOSIS — R269 Unspecified abnormalities of gait and mobility: Secondary | ICD-10-CM | POA: Diagnosis not present

## 2022-06-16 DIAGNOSIS — R251 Tremor, unspecified: Secondary | ICD-10-CM | POA: Diagnosis not present

## 2022-06-16 DIAGNOSIS — M542 Cervicalgia: Secondary | ICD-10-CM

## 2022-06-18 ENCOUNTER — Telehealth: Payer: Self-pay | Admitting: *Deleted

## 2022-06-18 DIAGNOSIS — M542 Cervicalgia: Secondary | ICD-10-CM

## 2022-06-18 NOTE — Telephone Encounter (Signed)
-----   Message from Star Age, MD sent at 06/18/2022 12:16 PM EDT ----- Please advise patient that his neck MRI showed mild degenerative changes, no obvious herniated disc, most prominent arthritic changes at C4-5.  If he would like to get a consultation with a spine specialist, we can facilitate with a referral to orthopedics or neurosurgery. He can also wait to discuss further with Dr. Krista Blue when she is back.

## 2022-06-18 NOTE — Telephone Encounter (Signed)
Pt is returning a call from the nurse and would like a call back.

## 2022-06-18 NOTE — Telephone Encounter (Signed)
I called patient to discuss. No answer, left a message asking him to call us back.

## 2022-06-18 NOTE — Telephone Encounter (Signed)
I called patient. I discussed his cervical MRI results. Patient would like a NS[facility] referral ASAP. He is bothered by neck pain. I advised him that we will place a referral to [facility] and that office will be in touch with him regarding the appointment. I reminded him of his follow up in October. Pt verbalized understanding of results. Pt had no further questions at this time but was encouraged to call back if questions arise.

## 2022-06-18 NOTE — Addendum Note (Signed)
Addended by: Lester Martin A on: 06/18/2022 04:14 PM   Modules accepted: Orders

## 2022-06-19 ENCOUNTER — Other Ambulatory Visit: Payer: Medicare Other

## 2022-06-19 ENCOUNTER — Telehealth: Payer: Self-pay | Admitting: Neurology

## 2022-06-19 NOTE — Telephone Encounter (Signed)
Sent to [facility] ph # [phone].

## 2022-06-29 DIAGNOSIS — M47812 Spondylosis without myelopathy or radiculopathy, cervical region: Secondary | ICD-10-CM | POA: Diagnosis not present

## 2022-07-26 ENCOUNTER — Ambulatory Visit: Payer: Medicare Other | Attending: Cardiology | Admitting: Cardiology

## 2022-07-26 ENCOUNTER — Encounter: Payer: Self-pay | Admitting: Cardiology

## 2022-07-26 VITALS — BP 132/78 | HR 56 | Ht 69.0 in | Wt 154.9 lb

## 2022-07-26 DIAGNOSIS — I10 Essential (primary) hypertension: Secondary | ICD-10-CM

## 2022-07-26 DIAGNOSIS — E785 Hyperlipidemia, unspecified: Secondary | ICD-10-CM

## 2022-07-26 DIAGNOSIS — I2510 Atherosclerotic heart disease of native coronary artery without angina pectoris: Secondary | ICD-10-CM | POA: Diagnosis not present

## 2022-07-26 NOTE — Patient Instructions (Signed)
Medication Instructions:  Your physician recommends that you continue on your current medications as directed. Please refer to the Current Medication list given to you today.  *If you need a refill on your cardiac medications before your next appointment, please call your pharmacy*   Lab Work: None  Testing/Procedures: None   Follow-Up: At [facility], you and your health needs are our priority.  As part of our continuing mission to provide you with exceptional heart care, we have created designated Provider Care Teams.  These Care Teams include your primary Cardiologist (physician) and Advanced Practice Providers (APPs -  Physician Assistants and Nurse Practitioners) who all work together to provide you with the care you need, when you need it.  We recommend signing up for the patient portal called "MyChart".  Sign up information is provided on this After Visit Summary.  MyChart is used to connect with patients for Virtual Visits (Telemedicine).  Patients are able to view lab/test results, encounter notes, upcoming appointments, etc.  Non-urgent messages can be sent to your provider as well.   To learn more about what you can do with MyChart, go to [email or web address].    Your next appointment:   6 month(s)  The format for your next appointment:   In Person  Provider:   Berniece Salines, DO   Other Instructions   Important Information About Sugar

## 2022-07-26 NOTE — Progress Notes (Signed)
Cardiology Office Note:    Date:  07/26/2022   ID:  Gregory Scott, DOB Apr 14, 1941, MRN [id number]  PCP:  Linden Dolin, PA-C  Cardiologist:  Berniece Salines, DO  Electrophysiologist:  None   Referring MD: Linden Dolin, PA-C   " I am doing fine"  History of Present Illness:    Gregory Scott is a 81 y.o. male with a hx of coronary artery disease status post microinfarction and now status post PCI to LAD, hypertension, hyperlipidemia, hypothyroidism, emphysema, history of CVA, history of prostate cancer in remission he tells me, GERD here today to establish cardiac care.  The patient did follow with Dr. Terrence Dupont for many years but decided at this time to transition to another cardiology office.  Today he offers no complaints.  He is just establishing care.  He denies any chest pain, shortness of breath, lightheadedness or dizziness.  He is here with his wife in the office.  He tells me he is still active.  Past Medical History:  Diagnosis Date   Acute MI anterior wall first episode care ([place]) 09/21/2015   Anemia    Arthritis    back   Coronary artery disease    Dyspnea    "some"   Emphysema lung ([place])    only sees  [facility]   Enlarged lymph node    GERD (gastroesophageal reflux disease)    History of hiatal hernia    History of kidney stones    2 lt kidney now  01/22/17   Hypertension    Hypothyroidism    Lung cancer ([place])    small part in the right lower    Pneumonia    as a child    Prostate cancer ([place])    PTSD (post-traumatic stress disorder)    Sleep apnea    sleep study-   [facility]ap    [place]  [facility]    Past Surgical History:  Procedure Laterality Date   ABDOMINAL EXPOSURE N/A 01/30/2017   Procedure: ABDOMINAL EXPOSURE;  Surgeon: Rosetta Posner, MD;  Location: [place];  Service: Vascular;  Laterality: N/A;   ANTERIOR LUMBAR FUSION Bilateral 01/30/2017   Procedure: LUMBAR 5-SACRAL 1 ANTERIOR INTERBODY FUSION LUMBAR;  Surgeon: Phylliss Bob, MD;   Location: [place];  Service: Orthopedics;  Laterality: Bilateral;  LUMBAR 5-SACRUM 1 ANTERIOR INTERBODY FUSION LUMBAR    CARDIAC CATHETERIZATION N/A 09/21/2015   Procedure: Left Heart Cath and Coronary Angiography;  Surgeon: Sherren Mocha, MD;  Location: [place] [facility];  Service: Cardiovascular;  Laterality: N/A;   CARDIAC CATHETERIZATION Right 09/21/2015   Procedure: Coronary Stent Intervention;  Surgeon: Sherren Mocha, MD;  Location: [place] [facility];  Service: Cardiovascular;  Laterality: Right;   COLONOSCOPY     knee surgery     SHOULDER SURGERY     TONSILLECTOMY     VIDEO BRONCHOSCOPY WITH ENDOBRONCHIAL NAVIGATION Right 04/30/2018   Procedure: VIDEO BRONCHOSCOPY WITH ENDOBRONCHIAL NAVIGATION;  Surgeon: Collene Gobble, MD;  Location[facility];  Service: Thoracic;  Laterality: Right;   VIDEO BRONCHOSCOPY WITH ENDOBRONCHIAL ULTRASOUND N/A 06/19/2017   Procedure: VIDEO BRONCHOSCOPY WITH ENDOBRONCHIAL ULTRASOUND;  Surgeon: Collene Gobble, MD;  Location: [facility];  Service: Thoracic;  Laterality: N/A;   VIDEO BRONCHOSCOPY WITH ENDOBRONCHIAL ULTRASOUND Right 04/30/2018   Procedure: VIDEO BRONCHOSCOPY WITH ENDOBRONCHIAL ULTRASOUND;  Surgeon: Collene Gobble, MD;  Location: [facility];  Service: Thoracic;  Laterality: Right;    Current Medications: Current Meds  Medication Sig  acetaminophen-codeine (TYLENOL #3) 300-30 MG tablet Take 2 tablets by mouth at bedtime.   aspirin EC 81 MG tablet Take 81 mg by mouth every morning.    Carboxymethylcellulose Sod PF 1 % GEL Place 1 drop into both eyes at bedtime as needed (dry eyes/irritation).   cycloSPORINE (RESTASIS) 0.05 % ophthalmic emulsion Place 1 drop into both eyes every 12 (twelve) hours as needed (dry eyes/irritation).   fluocinonide cream (LIDEX) 7.62 % Apply 1 application topically 2 (two) times daily as needed (itching).   levothyroxine (SYNTHROID, LEVOTHROID) 50 MCG tablet Take 75 mcg by mouth daily before breakfast.   lisinopril (ZESTRIL) 10 MG  tablet Take 10 mg by mouth every morning.   nitroGLYCERIN (NITROSTAT) 0.4 MG SL tablet Place 1 tablet (0.4 mg total) under the tongue every 5 (five) minutes x 3 doses as needed for chest pain.   omeprazole (PRILOSEC) 20 MG capsule Take 20 mg by mouth daily as needed (heartburn/indigestion).    Propylene Glycol (SYSTANE BALANCE) 0.6 % SOLN Place 1 drop into both eyes 4 (four) times daily as needed (dry eyes/irritation).   PROVENTIL HFA 108 (90 BASE) MCG/ACT inhaler Take 2 puffs by mouth every 6 (six) hours as needed for wheezing or shortness of breath.    rOPINIRole (REQUIP) 0.5 MG tablet Take 0.5 mg by mouth at bedtime.   rosuvastatin (CRESTOR) 20 MG tablet Take 20 mg by mouth every Monday, Wednesday, and Friday.      Allergies:   Statins   Social History   Socioeconomic History   Marital status: Married    Spouse name: Not on file   Number of children: Not on file   Years of education: Not on file   Highest education level: Not on file  Occupational History   Occupation: retired  Tobacco Use   Smoking status: Never   Smokeless tobacco: Never  Vaping Use   Vaping Use: Never used  Substance and Sexual Activity   Alcohol use: No    Alcohol/week: 0.0 standard drinks of alcohol   Drug use: No   Sexual activity: Not on file  Other Topics Concern   Not on file  Social History Narrative   Worked in a English as a second language teacher   Married   Nonsmoker   Social Determinants of Radio broadcast assistant Strain: Not on file  Food Insecurity: Not on file  Transportation Needs: Not on file  Physical Activity: Not on file  Stress: Not on file  Social Connections: Not on file     Family History: The patient's family history includes Coronary artery disease (age of onset: 24) in his mother; Lung cancer in his father.  ROS:   Review of Systems  Constitution: Negative for decreased appetite, fever and weight gain.  HENT: Negative for congestion, ear discharge, hoarse voice and sore throat.   Eyes:  Negative for discharge, redness, vision loss in right eye and visual halos.  Cardiovascular: Negative for chest pain, dyspnea on exertion, leg swelling, orthopnea and palpitations.  Respiratory: Negative for cough, hemoptysis, shortness of breath and snoring.   Endocrine: Negative for heat intolerance and polyphagia.  Hematologic/Lymphatic: Negative for bleeding problem. Does not bruise/bleed easily.  Skin: Negative for flushing, nail changes, rash and suspicious lesions.  Musculoskeletal: Negative for arthritis, joint pain, muscle cramps, myalgias, neck pain and stiffness.  Gastrointestinal: Negative for abdominal pain, bowel incontinence, diarrhea and excessive appetite.  Genitourinary: Negative for decreased libido, genital sores and incomplete emptying.  Neurological: Negative for brief paralysis, focal weakness, headaches  and loss of balance.  Psychiatric/Behavioral: Negative for altered mental status, depression and suicidal ideas.  Allergic/Immunologic: Negative for HIV exposure and persistent infections.    EKGs/Labs/Other Studies Reviewed:    The following studies were reviewed today:   EKG:  The ekg ordered today demonstrates sinus bradycardia, heart rate 56 bpm.  TTE 10/18/2020 IMPRESSIONS   1. Left ventricular ejection fraction, by estimation, is 55 to 60%. The left ventricle has normal function. The left ventricle has no regional  wall motion abnormalities. There is mild left ventricular hypertrophy. Left ventricular diastolic parameters  were normal.   2. Right ventricular systolic function is normal. The right ventricular size is normal.   3. The mitral valve is normal in structure. Trivial mitral valve regurgitation.   4. The aortic valve is normal in structure. Aortic valve regurgitation is not visualized.   5. The inferior vena cava is normal in size with greater than 50% respiratory variability, suggesting right atrial pressure of 3 mmHg.   FINDINGS   Left  Ventricle: Left ventricular ejection fraction, by estimation, is 55 to 60%. The left ventricle has normal function. The left ventricle has no  regional wall motion abnormalities. The left ventricular internal cavity size was normal in size. There is   mild left ventricular hypertrophy. Left ventricular diastolic parameters were normal.   Right Ventricle: The right ventricular size is normal. No increase in right ventricular wall thickness. Right ventricular systolic function is  normal.   Left Atrium: Left atrial size was normal in size.   Right Atrium: Right atrial size was normal in size.   Pericardium: There is no evidence of pericardial effusion.   Mitral Valve: The mitral valve is normal in structure. Trivial mitral valve regurgitation.   Tricuspid Valve: The tricuspid valve is normal in structure. Tricuspid valve regurgitation is not demonstrated.   Aortic Valve: The aortic valve is normal in structure. Aortic valve regurgitation is not visualized.   Pulmonic Valve: The pulmonic valve was normal in structure. Pulmonic valve regurgitation is trivial.   Aorta: The aortic root is normal in size and structure.   Venous: The inferior vena cava is normal in size with greater than 50% respiratory variability, suggesting right atrial pressure of 3 mmHg.   IAS/Shunts: No atrial level shunt detected by color flow Doppler.       Recent Labs: No results found for requested labs within last 365 days.  Recent Lipid Panel    Component Value Date/Time   CHOL 167 10/17/2020 1111   TRIG 123 10/17/2020 1111   HDL 50 10/17/2020 1111   CHOLHDL 3.3 10/17/2020 1111   VLDL 25 10/17/2020 1111   LDLCALC 92 10/17/2020 1111    Physical Exam:    VS:  BP 132/78   Pulse (!) 56   Ht 5\' 9"  (1.753 m)   Wt 154 lb 14.4 oz (70.3 kg)   SpO2 98%   BMI 22.87 kg/m     Wt Readings from Last 3 Encounters:  07/26/22 154 lb 14.4 oz (70.3 kg)  06/12/22 155 lb (70.3 kg)  02/14/22 153 lb (69.4 kg)      GEN: Well nourished, well developed in no acute distress HEENT: Normal NECK: No JVD; No carotid bruits LYMPHATICS: No lymphadenopathy CARDIAC: S1S2 noted,RRR, no murmurs, rubs, gallops RESPIRATORY:  Clear to auscultation without rales, wheezing or rhonchi  ABDOMEN: Soft, non-tender, non-distended, +bowel sounds, no guarding. EXTREMITIES: No edema, No cyanosis, no clubbing MUSCULOSKELETAL:  No deformity  SKIN: Warm and dry NEUROLOGIC:  Alert and oriented x 3, non-focal PSYCHIATRIC:  Normal affect, good insight  ASSESSMENT:    1. CAD in native artery   2. Hyperlipidemia, unspecified hyperlipidemia type   3. Hypertension, unspecified type    PLAN:     1.  Coronary artery disease-he does not have any anginal symptoms at this time.  We will continue aspirin and statin.  I was able to review his echocardiogram which was done in November 2021.  No need for any repeat.  He gets blood work done at the [place].  He will get his lipid profile done there and send me information about his labs once is done.   2.  Hypertension-blood pressure is acceptable, continue with current antihypertensive regimen.  3.  Hyperlipidemia - continue with current statin medication.  4.  EKG showed evidence of sinus bradycardia we will continue to monitor.  Not on any AV nodal blocking agents.  Asymptomatic.   The patient is in agreement with the above plan. The patient left the office in stable condition.  The patient will follow up in 6 months or sooner if needed.   Medication Adjustments/Labs and Tests Ordered: Current medicines are reviewed at length with the patient today.  Concerns regarding medicines are outlined above.  Orders Placed This Encounter  Procedures   EKG 12-Lead   No orders of the defined types were placed in this encounter.   Patient Instructions  Medication Instructions:  Your physician recommends that you continue on your current medications as directed. Please refer to the  Current Medication list given to you today.  *If you need a refill on your cardiac medications before your next appointment, please call your pharmacy*   Lab Work: None  Testing/Procedures: None   Follow-Up: At [facility], you and your health needs are our priority.  As part of our continuing mission to provide you with exceptional heart care, we have created designated Provider Care Teams.  These Care Teams include your primary Cardiologist (physician) and Advanced Practice Providers (APPs -  Physician Assistants and Nurse Practitioners) who all work together to provide you with the care you need, when you need it.  We recommend signing up for the patient portal called "MyChart".  Sign up information is provided on this After Visit Summary.  MyChart is used to connect with patients for Virtual Visits (Telemedicine).  Patients are able to view lab/test results, encounter notes, upcoming appointments, etc.  Non-urgent messages can be sent to your provider as well.   To learn more about what you can do with MyChart, go to [email or web address].    Your next appointment:   6 month(s)  The format for your next appointment:   In Person  Provider:   Berniece Salines, DO   Other Instructions   Important Information About Sugar         Adopting a Healthy Lifestyle.  Know what a healthy weight is for you (roughly BMI <25) and aim to maintain this   Aim for 7+ servings of fruits and vegetables daily   65-80+ fluid ounces of water or unsweet tea for healthy kidneys   Limit to max 1 drink of alcohol per day; avoid smoking/tobacco   Limit animal fats in diet for cholesterol and heart health - choose grass fed whenever available   Avoid highly processed foods, and foods high in saturated/trans fats   Aim for low stress - take time to unwind and care for your mental health   Aim for 150 min  of moderate intensity exercise weekly for heart health, and weights twice weekly  for bone health   Aim for 7-9 hours of sleep daily   When it comes to diets, agreement about the perfect plan isnt easy to find, even among the experts. Experts at the [employer] developed an idea known as the Healthy Eating Plate. Just imagine a plate divided into logical, healthy portions.   The emphasis is on diet quality:   Load up on vegetables and fruits - one-half of your plate: Aim for color and variety, and remember that potatoes dont count.   Go for whole grains - one-quarter of your plate: Whole wheat, barley, wheat berries, quinoa, oats, brown rice, and foods made with them. If you want pasta, go with whole wheat pasta.   Protein power - one-quarter of your plate: Fish, chicken, beans, and nuts are all healthy, versatile protein sources. Limit red meat.   The diet, however, does go beyond the plate, offering a few other suggestions.   Use healthy plant oils, such as olive, canola, soy, corn, sunflower and peanut. Check the labels, and avoid partially hydrogenated oil, which have unhealthy trans fats.   If youre thirsty, drink water. Coffee and tea are good in moderation, but skip sugary drinks and limit milk and dairy products to one or two daily servings.   The type of carbohydrate in the diet is more important than the amount. Some sources of carbohydrates, such as vegetables, fruits, whole grains, and beans-are healthier than others.   Finally, stay active  Signed, Berniece Salines, DO  07/26/2022 10:43 AM    [place]

## 2022-07-31 ENCOUNTER — Telehealth: Payer: Self-pay | Admitting: Cardiology

## 2022-07-31 NOTE — Telephone Encounter (Signed)
Attempt to reach, incoming call was blocked by "smaet call" call blocker.  In reviewing chart I do not see where we have called patient.   I will send MyChart message to patient.

## 2022-07-31 NOTE — Telephone Encounter (Signed)
Patient states he was returning call. Please advise ?

## 2022-08-09 DIAGNOSIS — C61 Malignant neoplasm of prostate: Secondary | ICD-10-CM | POA: Diagnosis not present

## 2022-08-13 ENCOUNTER — Encounter: Payer: Self-pay | Admitting: Cardiology

## 2022-08-13 NOTE — Telephone Encounter (Signed)
Called pt's wife. See her chart.

## 2022-08-18 DIAGNOSIS — M79631 Pain in right forearm: Secondary | ICD-10-CM | POA: Diagnosis not present

## 2022-08-18 DIAGNOSIS — W19XXXA Unspecified fall, initial encounter: Secondary | ICD-10-CM | POA: Diagnosis not present

## 2022-09-13 ENCOUNTER — Ambulatory Visit (INDEPENDENT_AMBULATORY_CARE_PROVIDER_SITE_OTHER): Payer: Medicare Other | Admitting: Neurology

## 2022-09-13 ENCOUNTER — Encounter: Payer: Self-pay | Admitting: Neurology

## 2022-09-13 VITALS — BP 168/83 | HR 54 | Ht 69.0 in | Wt 157.5 lb

## 2022-09-13 DIAGNOSIS — I2510 Atherosclerotic heart disease of native coronary artery without angina pectoris: Secondary | ICD-10-CM | POA: Diagnosis not present

## 2022-09-13 DIAGNOSIS — G243 Spasmodic torticollis: Secondary | ICD-10-CM | POA: Insufficient documentation

## 2022-09-13 DIAGNOSIS — M542 Cervicalgia: Secondary | ICD-10-CM | POA: Diagnosis not present

## 2022-09-13 DIAGNOSIS — R251 Tremor, unspecified: Secondary | ICD-10-CM

## 2022-09-13 NOTE — Progress Notes (Signed)
Chief Complaint  Patient presents with   Follow-up    Rm 14. Alone. C/o chronic neck pain. Denies improvement with tremors.      ASSESSMENT AND PLAN  Gregory Scott is a 81 y.o. 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 Chronic neck pain  MRI of the cervical spine showed multilevel degenerative changes, most prominent C4-5, with moderate left neuroforaminal stenosis, no canal stenosis, was sent to spine specialist, not surgical candidate,  Will try xeomin injection, preauthorization for 200 units, will only use 100 unit for first injection   DIAGNOSTIC DATA (LABS, IMAGING, TESTING) - I reviewed patient records, labs, notes, testing and imaging myself where available.   MEDICAL HISTORY:  Gregory Scott is a 81 year old male, seen in request by his primary care PA Jenean Lindau, [place] for evaluation of tremor, gait abnormality,  I reviewed and summarized the referring note.PMHX. Chronic low back pain. Hypothyroidism' HLD HTN CAD Emphysema History of Kidney stone, PTSD,  OSA--could not tolerate CPAP  Reported more than 15 years history of gradual onset head titubation, only mild hand tremor, no limitation on his daily activities, he likes to work on his cars, and yard, he denies a family history of tremor  He complains of long history of chronic low back pain, getting worse over the past few years, now often feel bilateral lower extremity numbness achy pain, from knee down, frequent bilateral lower extremity muscle cramping, leg feels cold, sometimes also have hands muscle cramp, at night, sometimes has difficulty finding a comfortable position, difficulty sleeping because of that  He was diagnosed with restless leg syndrome, taking Requip 0.5 mg seems to help him some, previously tried gabapentin and Lyrica did not help  UPDATE Sep 13 2022: Personally reviewed MRI of the cervical spine in July 2023, multilevel degenerative changes,  most prominent C4-5, broad disc osteophyte protrusion, facet hypertrophy with moderate left mild right foraminal narrowing, no canal stenosis  He was seen by spine specialist, not surgical candidate  He mains active, drive his mobile home for travel regularly, 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  PHYSICAL EXAMNIATION:  Gen: NAD, conversant, well nourised, well groomed                     Cardiovascular: Regular rate rhythm, no peripheral edema, warm, nontender. Eyes: Conjunctivae clear without exudates or hemorrhage Neck: Supple, no carotid bruits. Pulmonary: Clear to auscultation bilaterally   NEUROLOGICAL EXAM:  MENTAL STATUS: Speech/cognition: Constant head titubation, mild right tilt, retrocollis Awake, alert, oriented to history taking and casual conversation CRANIAL NERVES: CN II: Visual fields are full to confrontation. Pupils are round equal and briskly reactive to light. CN III, IV, VI: extraocular movement are normal. No ptosis. CN V: Facial sensation is intact to light touch CN VII: Face is symmetric with normal eye closure  CN VIII: Hearing is normal to causal conversation. CN IX, X: Phonation is normal. CN XI: Head turning and shoulder shrug are intact  MOTOR: Mild bilateral hand postural tremor, mild bilateral shoulder abduction weakness,  REFLEXES: Reflexes are 2+ and symmetric at  the biceps, triceps, 2/2  knees, and ankles. Plantar responses are extensor bilaterally  SENSORY: Intact to light touch, pinprick and vibratory sensation are intact in fingers and toes.  COORDINATION: There is no trunk or limb dysmetria noted.  GAIT/STANCE: He  can get up from seated position arm crossed, mildly stiff, cautious,  REVIEW OF SYSTEMS:  Full 14 system review of systems performed and notable only for as above All other review of systems were negative.   ALLERGIES: Allergies  Allergen Reactions   Statins Other (See Comments)    MYALGIAS Legs hurt in high doses    HOME MEDICATIONS: Current Outpatient Medications  Medication Sig Dispense Refill   acetaminophen-codeine (TYLENOL #3) 300-30 MG tablet Take 2 tablets by mouth at bedtime.     aspirin EC 81 MG tablet Take 81 mg by mouth every morning.      Carboxymethylcellulose Sod PF 1 % GEL Place 1 drop into both eyes at bedtime as needed (dry eyes/irritation).     cycloSPORINE (RESTASIS) 0.05 % ophthalmic emulsion Place 1 drop into both eyes every 12 (twelve) hours as needed (dry eyes/irritation).     fluocinonide cream (LIDEX) 7.90 % Apply 1 application topically 2 (two) times daily as needed (itching).     levothyroxine (SYNTHROID, LEVOTHROID) 50 MCG tablet Take 75 mcg by mouth daily before breakfast.     lisinopril (ZESTRIL) 10 MG tablet Take 10 mg by mouth every morning.     nitroGLYCERIN (NITROSTAT) 0.4 MG SL tablet Place 1 tablet (0.4 mg total) under the tongue every 5 (five) minutes x 3 doses as needed for chest pain. 25 tablet 0   omeprazole (PRILOSEC) 20 MG capsule Take 20 mg by mouth daily as needed (heartburn/indigestion).      Propylene Glycol (SYSTANE BALANCE) 0.6 % SOLN Place 1 drop into both eyes 4 (four) times daily as needed (dry eyes/irritation).     PROVENTIL HFA 108 (90 BASE) MCG/ACT inhaler Take 2 puffs by mouth every 6 (six) hours as needed for wheezing or shortness of breath.      rOPINIRole (REQUIP) 0.5 MG tablet Take 0.5 mg by mouth at bedtime.     rosuvastatin (CRESTOR) 20 MG tablet Take 20 mg by mouth every Monday, Wednesday, and Friday.      No current facility-administered medications for this visit.    PAST MEDICAL HISTORY: Past Medical History:   Diagnosis Date   Acute MI anterior wall first episode care ([place]) 09/21/2015   Anemia    Arthritis    back   Coronary artery disease    Dyspnea    "some"   Emphysema lung ([facility])    [facility]   Enlarged lymph node    GERD (gastroesophageal reflux disease)    History of hiatal hernia    History of kidney stones    2 lt kidney now  01/22/17   Hypertension    Hypothyroidism    Lung cancer ([place])    small part in the right lower    Pneumonia    as a child    Prostate cancer ([place])    PTSD (post-traumatic stress disorder)    Sleep apnea    sleep study-   [facility]ap    [place]  [facility]    PAST SURGICAL HISTORY: Past Surgical History:  Procedure Laterality Date   ABDOMINAL EXPOSURE N/A 01/30/2017   Procedure: ABDOMINAL EXPOSURE;  Surgeon: Rosetta Posner, MD;  Location: [place];  Service: Vascular;  Laterality:  N/A;   ANTERIOR LUMBAR FUSION Bilateral 01/30/2017   Procedure: LUMBAR 5-SACRAL 1 ANTERIOR INTERBODY FUSION LUMBAR;  Surgeon: Phylliss Bob, MD;  Location: [place];  Service: Orthopedics;  Laterality: Bilateral;  LUMBAR 5-SACRUM 1 ANTERIOR INTERBODY FUSION LUMBAR    CARDIAC CATHETERIZATION N/A 09/21/2015   Procedure: Left Heart Cath and Coronary Angiography;  Surgeon: Sherren Mocha, MD;  Location: [place] [facility];  Service: Cardiovascular;  Laterality: N/A;   CARDIAC CATHETERIZATION Right 09/21/2015   Procedure: Coronary Stent Intervention;  Surgeon: Sherren Mocha, MD;  Location: [place] [facility];  Service: Cardiovascular;  Laterality: Right;   COLONOSCOPY     knee surgery     SHOULDER SURGERY     TONSILLECTOMY     VIDEO BRONCHOSCOPY WITH ENDOBRONCHIAL NAVIGATION Right 04/30/2018   Procedure: VIDEO BRONCHOSCOPY WITH ENDOBRONCHIAL NAVIGATION;  Surgeon: Collene Gobble, MD;  [facility];  Service: Thoracic;  Laterality: Right;   VIDEO BRONCHOSCOPY WITH ENDOBRONCHIAL ULTRASOUND N/A 06/19/2017   Procedure: VIDEO BRONCHOSCOPY WITH ENDOBRONCHIAL ULTRASOUND;  Surgeon: Collene Gobble, MD;  [facility];  Service: Thoracic;  Laterality: N/A;   VIDEO BRONCHOSCOPY WITH ENDOBRONCHIAL ULTRASOUND Right 04/30/2018   Procedure: VIDEO BRONCHOSCOPY WITH ENDOBRONCHIAL ULTRASOUND;  Surgeon: Collene Gobble, MD;  [facility];  Service: Thoracic;  Laterality: Right;    FAMILY HISTORY: Family History  Problem Relation Age of Onset   Coronary artery disease Mother 31   Lung cancer Father     SOCIAL HISTORY: Social History   Socioeconomic History   Marital status: Married    Spouse name: Not on file   Number of children: Not on file   Years of education: Not on file   Highest education level: Not on file  Occupational History   Occupation: retired  Tobacco Use   Smoking status: Never   Smokeless tobacco: Never  Vaping Use   Vaping Use: Never used  Substance and Sexual Activity   Alcohol use: No    Alcohol/week: 0.0 standard drinks of alcohol   Drug use: No   Sexual activity: Not on file  Other Topics Concern   Not on file  Social History Narrative   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 Ph.D.  [facility] [address] [place], [place] [address] Ph: [phone] Fax: [phone]  CC:  Linden Dolin, PA-C No address on file  Linden Dolin, [place]

## 2022-09-25 ENCOUNTER — Telehealth: Payer: Self-pay | Admitting: Neurology

## 2022-09-25 NOTE — Telephone Encounter (Signed)
Pt scheduled for xeomin injection for 10/24/22. Pt will need prior authorization for xeomin before appointment

## 2022-09-25 NOTE — Telephone Encounter (Addendum)
Please submit new PA for Xeomin J0588 200Units  95874/EMG guidance.    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

## 2022-09-26 ENCOUNTER — Other Ambulatory Visit (HOSPITAL_COMMUNITY): Payer: Self-pay

## 2022-09-26 NOTE — Telephone Encounter (Signed)
Patient Advocate Encounter   Received notification that prior authorization for Xeomin 200UNIT solution is required.   PA submitted on 09/26/2022 Key [id number] Status is pending       Lyndel Safe, [place] Patient Advocate Specialist [place] Patient Advocate Team Direct Number: [phone]  Fax: [phone]

## 2022-09-27 ENCOUNTER — Other Ambulatory Visit (HOSPITAL_COMMUNITY): Payer: Self-pay

## 2022-09-27 MED ORDER — XEOMIN 200 UNITS IM SOLR: 200.0000 [IU] | Freq: Once | INTRAMUSCULAR | 1 refills | Status: DC

## 2022-09-27 NOTE — Addendum Note (Signed)
Addended by: Verlin Grills on: 09/27/2022 02:09 PM   Modules accepted: Orders

## 2022-09-27 NOTE — Telephone Encounter (Signed)
Patient Advocate Encounter  Prior Authorization for Xeomin 200UNIT solution has been approved.    PA# [id number] Effective dates: 09/26/2022 through 09/26/2023  [place], [place] Patient Advocate Specialist [place] Patient Advocate Team Direct Number: [phone]  Fax: [phone]

## 2022-09-27 NOTE — Telephone Encounter (Signed)
Do you know which specialty pharmacy it needs to go to?

## 2022-09-27 NOTE — Addendum Note (Signed)
Addended by: Gertie Baron D on: 09/27/2022 01:50 PM   Modules accepted: Orders

## 2022-09-27 NOTE — Telephone Encounter (Signed)
Rx sent to [facility], will schedule delivery after 5 business days of processing.Marland Kitchen

## 2022-10-01 ENCOUNTER — Other Ambulatory Visit: Payer: Self-pay

## 2022-10-01 MED ORDER — XEOMIN 200 UNITS IM SOLR: 200.0000 [IU] | INTRAMUSCULAR | 1 refills | Status: AC

## 2022-10-09 ENCOUNTER — Telehealth: Payer: Self-pay | Admitting: Neurology

## 2022-10-09 NOTE — Telephone Encounter (Signed)
Mauro Kaufmann is calling from [place]. Stated she need DX code or injection site for medication IncobotulinumtoxinA (XEOMIN) 200 units SOLR.

## 2022-10-09 NOTE — Telephone Encounter (Signed)
I called [facility] and spoke with Loma Sousa, provided all the neccessary information for processing. She will update rx.

## 2022-10-15 NOTE — Telephone Encounter (Signed)
Pt is calling. Stated a new prescription for Xeomin need to be faxed to [facility]. Pt has there number but not faxed number [phone].

## 2022-10-15 NOTE — Telephone Encounter (Signed)
I have sent a message to our pharmacy intake team on this.

## 2022-10-15 NOTE — Telephone Encounter (Signed)
Cyn[facility] tech) [phone] reports that they received a transferred Rx for pt's Xeomin 200 unit vials.  Caren Griffins states they need a new prescription sent to them

## 2022-10-16 NOTE — Telephone Encounter (Signed)
A New PA should not be needed but sent a Benefits Verification to [employer] to make sure.

## 2022-10-17 NOTE — Telephone Encounter (Signed)
[facility] said that they are out of network with [facility] and said this patient's [facility] [phone]

## 2022-10-18 MED ORDER — XEOMIN 200 UNITS IM SOLR: 200.0000 [IU] | INTRAMUSCULAR | 1 refills | Status: DC

## 2022-10-18 NOTE — Telephone Encounter (Signed)
[facility]

## 2022-10-18 NOTE — Addendum Note (Signed)
Addended by: Verlin Grills on: 10/18/2022 12:08 PM   Modules accepted: Orders

## 2022-10-23 ENCOUNTER — Other Ambulatory Visit (HOSPITAL_COMMUNITY): Payer: Self-pay

## 2022-10-23 MED ORDER — XEOMIN 200 UNITS IM SOLR: 200.0000 [IU] | INTRAMUSCULAR | 1 refills | Status: DC

## 2022-10-23 NOTE — Telephone Encounter (Signed)
Called and spoke with [facility] rep, she states she does not see Xeomin refill and that it can take up to 24 hours to come through on their end.

## 2022-10-23 NOTE — Telephone Encounter (Signed)
Refill for Xeomin 200 units sent to [facility].

## 2022-10-23 NOTE — Addendum Note (Signed)
Addended by: Verlin Grills on: 10/23/2022 11:40 AM   Modules accepted: Orders

## 2022-10-23 NOTE — Telephone Encounter (Signed)
[facility] 10[facility]18[facility]22. No new PA needed.

## 2022-10-24 ENCOUNTER — Ambulatory Visit (INDEPENDENT_AMBULATORY_CARE_PROVIDER_SITE_OTHER): Payer: Medicare Other | Admitting: Neurology

## 2022-10-24 DIAGNOSIS — G243 Spasmodic torticollis: Secondary | ICD-10-CM | POA: Diagnosis not present

## 2022-10-24 MED ORDER — INCOBOTULINUMTOXINA 100 UNITS IM SOLR: 100.0000 [IU] | INTRAMUSCULAR | Status: AC

## 2022-10-24 NOTE — Telephone Encounter (Signed)
Called and spoke with [facility] rep [phone]). He tried to call pt to obtain consent for [place] to order Xeomin for him and could not reach him. Pt needs to give them a call back to give consent as well as be made aware of $210 copay that he will owe for the Xeomin.

## 2022-10-24 NOTE — Telephone Encounter (Signed)
I called pt and spoke with him about the copay needed. He is agreeable to this and will [facility].

## 2022-10-24 NOTE — Telephone Encounter (Signed)
Gregory Scott [facility]. Stated Botox will be delivered on 12/11 with no signature required.

## 2022-10-24 NOTE — Progress Notes (Signed)
Chief Complaint  Patient presents with   Procedure    Xeomin 100 units       ASSESSMENT AND PLAN  Gregory Scott is a 81 y.o. 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 Chronic neck pain  MRI of the cervical spine showed multilevel degenerative changes, most prominent C4-5, with moderate left neuroforaminal stenosis, no canal stenosis, was sent to spine specialist, not surgical candidate,    EMG guided xeomin injection, use 100 units, Constant head titubation, mild right tilt, retrocollis  Right splenius capitis 25 units Right splenius cervix 25 units Right levator scapular 25 units Left splenius capitis 25 units    DIAGNOSTIC DATA (LABS, IMAGING, TESTING) - I reviewed patient records, labs, notes, testing and imaging myself where available.   MEDICAL HISTORY:  Gregory Scott is a 81 year old male, seen in request by his primary care PA Jenean Lindau, [place] for evaluation of tremor, gait abnormality,  I reviewed and summarized the referring note.PMHX. Chronic low back pain. Hypothyroidism' HLD HTN CAD Emphysema History of Kidney stone, PTSD,  OSA--could not tolerate CPAP  Reported more than 15 years history of gradual onset head titubation, only mild hand tremor, no limitation on his daily activities, he likes to work on his cars, and yard, he denies a family history of tremor  He complains of long history of chronic low back pain, getting worse over the past few years, now often feel bilateral lower extremity numbness achy pain, from knee down, frequent bilateral lower extremity muscle cramping, leg feels cold, sometimes also have hands muscle cramp, at night, sometimes has difficulty finding a comfortable position, difficulty sleeping because of that  He was diagnosed with restless leg syndrome, taking Requip 0.5 mg seems to help him some, previously tried gabapentin and Lyrica did not help  UPDATE Sep 13 2022: Personally  reviewed MRI of the cervical spine in July 2023, multilevel degenerative changes, most prominent C4-5, broad disc osteophyte protrusion, facet hypertrophy with moderate left mild right foraminal narrowing, no canal stenosis  He was seen by spine specialist, not surgical candidate  He remains active, drive his mobile home for travel regularly, 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 Eyes: Conjunctivae clear without exudates or hemorrhage Neck: Supple, no carotid bruits. Pulmonary: Clear to auscultation bilaterally   NEUROLOGICAL EXAM:  MENTAL STATUS: Speech/cognition: Constant head titubation, mild right tilt, retrocollis Awake, alert, oriented to history taking and casual conversation CRANIAL NERVES: CN II: Visual fields are full to confrontation. Pupils are round equal and briskly reactive to light. CN III, IV, VI: extraocular movement are normal. No ptosis. CN V: Facial sensation is intact to light touch CN VII: Face is symmetric with normal eye closure  CN VIII: Hearing is normal to causal conversation. CN IX, X: Phonation is normal. CN XI: Head turning and shoulder shrug are intact  MOTOR: Mild bilateral hand postural tremor, mild bilateral shoulder abduction weakness,  REFLEXES: Reflexes are 2+ and symmetric at the biceps, triceps, 2/2  knees,  and ankles. Plantar responses are extensor bilaterally  SENSORY: Intact to light touch, pinprick and vibratory sensation are intact in fingers and toes.  COORDINATION: There is no trunk or limb  dysmetria noted.  GAIT/STANCE: He can get up from seated position arm crossed, mildly stiff, cautious,  REVIEW OF SYSTEMS:  Full 14 system review of systems performed and notable only for as above All other review of systems were negative.   ALLERGIES: Allergies  Allergen Reactions   Statins Other (See Comments)    MYALGIAS Legs hurt in high doses    HOME MEDICATIONS: Current Outpatient Medications  Medication Sig Dispense Refill   acetaminophen-codeine (TYLENOL #3) 300-30 MG tablet Take 2 tablets by mouth at bedtime.     aspirin EC 81 MG tablet Take 81 mg by mouth every morning.      Carboxymethylcellulose Sod PF 1 % GEL Place 1 drop into both eyes at bedtime as needed (dry eyes/irritation).     cycloSPORINE (RESTASIS) 0.05 % ophthalmic emulsion Place 1 drop into both eyes every 12 (twelve) hours as needed (dry eyes/irritation).     fluocinonide cream (LIDEX) 4.19 % Apply 1 application topically 2 (two) times daily as needed (itching).     IncobotulinumtoxinA (XEOMIN) 200 units SOLR Inject 200 Units into the muscle every 3 (three) months. 2 each 1   IncobotulinumtoxinA (XEOMIN) 200 units SOLR Inject 200 Units into the muscle every 3 (three) months. 1 each 1   levothyroxine (SYNTHROID, LEVOTHROID) 50 MCG tablet Take 75 mcg by mouth daily before breakfast.     lisinopril (ZESTRIL) 10 MG tablet Take 10 mg by mouth every morning.     nitroGLYCERIN (NITROSTAT) 0.4 MG SL tablet Place 1 tablet (0.4 mg total) under the tongue every 5 (five) minutes x 3 doses as needed for chest pain. 25 tablet 0   omeprazole (PRILOSEC) 20 MG capsule Take 20 mg by mouth daily as needed (heartburn/indigestion).      Propylene Glycol (SYSTANE BALANCE) 0.6 % SOLN Place 1 drop into both eyes 4 (four) times daily as needed (dry eyes/irritation).     PROVENTIL HFA 108 (90 BASE) MCG/ACT inhaler Take 2 puffs by mouth every 6 (six) hours as needed for wheezing or shortness of breath.      rOPINIRole (REQUIP) 0.5 MG  tablet Take 0.5 mg by mouth at bedtime.     rosuvastatin (CRESTOR) 20 MG tablet Take 20 mg by mouth every Monday, Wednesday, and Friday.      Current Facility-Administered Medications  Medication Dose Route Frequency Provider Last Rate Last Admin   incobotulinumtoxinA (XEOMIN) 100 units injection 100 Units  100 Units Intramuscular Q90 days Marcial Pacas, MD        PAST MEDICAL HISTORY: Past Medical History:  Diagnosis Date   Acute MI anterior wall first episode care ([place]) 09/21/2015   Anemia    Arthritis    back   Coronary artery disease    Dyspnea    "some"   Emphysema lung ([place])    only [facility]  [facility]   Enlarged lymph node    GERD (gastroesophageal reflux disease)    History of hiatal hernia    History of kidney stones    2 lt kidney now  01/22/17   Hypertension    Hypothyroidism    Lung cancer ([place])    small part in the right lower    Pneumonia    as a child    Prostate cancer ([place])  PTSD (post-traumatic stress disorder)    Sleep apnea    sleep study-   [facility]ap    [place]  [facility]    PAST SURGICAL HISTORY: Past Surgical History:  Procedure Laterality Date   ABDOMINAL EXPOSURE N/A 01/30/2017   Procedure: ABDOMINAL EXPOSURE;  Surgeon: Rosetta Posner, MD;  Location: [place];  Service: Vascular;  Laterality: N/A;   ANTERIOR LUMBAR FUSION Bilateral 01/30/2017   Procedure: LUMBAR 5-SACRAL 1 ANTERIOR INTERBODY FUSION LUMBAR;  Surgeon: Phylliss Bob, MD;  Location: [place];  Service: Orthopedics;  Laterality: Bilateral;  LUMBAR 5-SACRUM 1 ANTERIOR INTERBODY FUSION LUMBAR    CARDIAC CATHETERIZATION N/A 09/21/2015   Procedure: Left Heart Cath and Coronary Angiography;  Surgeon: Sherren Mocha, MD;  Location: [place] [facility];  Service: Cardiovascular;  Laterality: N/A;   CARDIAC CATHETERIZATION Right 09/21/2015   Procedure: Coronary Stent Intervention;  Surgeon: Sherren Mocha, MD;  Location: [place] [facility];  Service: Cardiovascular;  Laterality: Right;   COLONOSCOPY     knee  surgery     SHOULDER SURGERY     TONSILLECTOMY     VIDEO BRONCHOSCOPY WITH ENDOBRONCHIAL NAVIGATION Right 04/30/2018   Procedure: VIDEO BRONCHOSCOPY WITH ENDOBRONCHIAL NAVIGATION;  Surgeon: Collene Gobble, MD;  [facility];  Service: Thoracic;  Laterality: Right;   VIDEO BRONCHOSCOPY WITH ENDOBRONCHIAL ULTRASOUND N/A 06/19/2017   Procedure: VIDEO BRONCHOSCOPY WITH ENDOBRONCHIAL ULTRASOUND;  Surgeon: Collene Gobble, MD;  [facility];  Service: Thoracic;  Laterality: N/A;   VIDEO BRONCHOSCOPY WITH ENDOBRONCHIAL ULTRASOUND Right 04/30/2018   Procedure: VIDEO BRONCHOSCOPY WITH ENDOBRONCHIAL ULTRASOUND;  Surgeon: Collene Gobble, MD;  [facility];  Service: Thoracic;  Laterality: Right;    FAMILY HISTORY: Family History  Problem Relation Age of Onset   Coronary artery disease Mother 36   Lung cancer Father     SOCIAL HISTORY: Social History   Socioeconomic History   Marital status: Married    Spouse name: Not on file   Number of children: Not on file   Years of education: Not on file   Highest education level: Not on file  Occupational History   Occupation: retired  Tobacco Use   Smoking status: Never   Smokeless tobacco: Never  Vaping Use   Vaping Use: Never used  Substance and Sexual Activity   Alcohol use: No    Alcohol/week: 0.0 standard drinks of alcohol   Drug use: No   Sexual activity: Not on file  Other Topics Concern   Not on file  Social History Narrative   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 Ph.D.  [facility] [address], [place], [place] [address] Ph: [phone] Fax: [phone]  CC:  Linden Dolin, PA-C No  address on file  Linden Dolin, [place]

## 2022-10-24 NOTE — Progress Notes (Signed)
Xeomin 100units x 1 vial [id number] [id number] Exp-07/2024 S/P

## 2022-10-29 NOTE — Telephone Encounter (Signed)
[facility]

## 2022-11-08 DIAGNOSIS — C61 Malignant neoplasm of prostate: Secondary | ICD-10-CM | POA: Diagnosis not present

## 2022-11-20 DIAGNOSIS — H43811 Vitreous degeneration, right eye: Secondary | ICD-10-CM | POA: Diagnosis not present

## 2022-11-20 DIAGNOSIS — H43822 Vitreomacular adhesion, left eye: Secondary | ICD-10-CM | POA: Diagnosis not present

## 2022-11-20 DIAGNOSIS — H35033 Hypertensive retinopathy, bilateral: Secondary | ICD-10-CM | POA: Diagnosis not present

## 2022-11-20 DIAGNOSIS — H26493 Other secondary cataract, bilateral: Secondary | ICD-10-CM | POA: Diagnosis not present

## 2022-11-29 DIAGNOSIS — M542 Cervicalgia: Secondary | ICD-10-CM | POA: Diagnosis not present

## 2022-12-31 LAB — LAB REPORT - SCANNED
A1c: 5.4
Microalb Creat Ratio: 8.2

## 2023-01-08 NOTE — Telephone Encounter (Signed)
[facility].

## 2023-01-09 NOTE — Telephone Encounter (Signed)
Received fax that pt's Xeomin will be delivered 01/14/23.

## 2023-01-15 ENCOUNTER — Encounter: Payer: Self-pay | Admitting: Cardiology

## 2023-01-16 ENCOUNTER — Ambulatory Visit: Payer: Medicare Other | Admitting: Neurology

## 2023-01-23 ENCOUNTER — Encounter: Payer: Self-pay | Admitting: Neurology

## 2023-01-23 ENCOUNTER — Telehealth: Payer: Self-pay | Admitting: Neurology

## 2023-01-23 ENCOUNTER — Ambulatory Visit (INDEPENDENT_AMBULATORY_CARE_PROVIDER_SITE_OTHER): Payer: Medicare Other | Admitting: Neurology

## 2023-01-23 VITALS — BP 144/84 | HR 57 | Ht 69.0 in | Wt 157.0 lb

## 2023-01-23 DIAGNOSIS — G243 Spasmodic torticollis: Secondary | ICD-10-CM | POA: Diagnosis not present

## 2023-01-23 MED ORDER — INCOBOTULINUMTOXINA 200 UNITS IM SOLR: 200.0000 [IU] | Freq: Once | INTRAMUSCULAR | Status: AC

## 2023-01-23 NOTE — Telephone Encounter (Signed)
Referral for neurosurgery fax to [facility]. Phone: [phone], Fax: [phone]

## 2023-01-23 NOTE — Progress Notes (Signed)
Xeomin 200 units x 1 vial  [id number] [id number] Exp-04-2025 SP

## 2023-01-23 NOTE — Progress Notes (Signed)
Chief Complaint  Patient presents with   Procedure    Rm 14 States he xeomin did not work well enough       ASSESSMENT AND PLAN  Gregory Scott is a 82 y.o. 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 Chronic neck pain  MRI of the cervical spine showed multilevel degenerative changes, most prominent C4-5, with moderate left neuroforaminal stenosis, no canal stenosis, was sent to spine specialist, not surgical candidate,  First EMG guided xeomin injection on October 24, 2022,  EMG guided xeomin injection, use 200 units, frequent head titubation, mild right tilt, retrocollis   Right splenius capitis 25 units Right splenius cervix 25 units Right levator scapular 25 units Right iliocostalis 25 units  Left levator scapula 25 units Left splenius cervix 25 x2=50units Left splenius capitis 25 units     DIAGNOSTIC DATA (LABS, IMAGING, TESTING) - I reviewed patient records, labs, notes, testing and imaging myself where available.   MEDICAL HISTORY:  Gregory Scott is a 82 year old male, seen in request by his primary care PA Jenean Lindau, [place] for evaluation of tremor, gait abnormality,  I reviewed and summarized the referring note.PMHX. Chronic low back pain. Hypothyroidism' HLD HTN CAD Emphysema History of Kidney stone, PTSD,  OSA--could not tolerate CPAP  Reported more than 15 years history of gradual onset head titubation, only mild hand tremor, no limitation on his daily activities, he likes to work on his cars, and yard, he denies a family history of tremor  He complains of long history of chronic low back pain, getting worse over the past few years, now often feel bilateral lower extremity numbness achy pain, from knee down, frequent bilateral lower extremity muscle cramping, leg feels cold, sometimes also have hands muscle cramp, at night, sometimes has difficulty finding a comfortable position, difficulty sleeping because of  that  He was diagnosed with restless leg syndrome, taking Requip 0.5 mg seems to help him some, previously tried gabapentin and Lyrica did not help  UPDATE Sep 13 2022: Personally reviewed MRI of the cervical spine in July 2023, multilevel degenerative changes, most prominent C4-5, broad disc osteophyte protrusion, facet hypertrophy with moderate left mild right foraminal narrowing, no canal stenosis  He was seen by spine specialist, not surgical candidate  He remains active, drive his mobile home for travel regularly, 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  PHYSICAL EXAM:   Vitals:   01/23/23 1417  Weight: 157 lb (71.2 kg)  Height: '5\' 9"'$  (1.753 m)      Frequent head titubation, mild right tilt, retrocollis   REVIEW OF SYSTEMS:  Full 14 system review of systems performed and notable only for as above All other review of systems were negative.   ALLERGIES: Allergies  Allergen Reactions   Statins Other (See Comments)    MYALGIAS Legs hurt in high doses    HOME MEDICATIONS: Current Outpatient Medications  Medication Sig Dispense Refill   acetaminophen-codeine (TYLENOL #3) 300-30 MG tablet Take 2 tablets by mouth at bedtime.     aspirin  EC 81 MG tablet Take 81 mg by mouth every morning.      Carboxymethylcellulose Sod PF 1 % GEL Place 1 drop into both eyes at bedtime as needed (dry eyes/irritation).     cycloSPORINE (RESTASIS) 0.05 % ophthalmic emulsion Place 1 drop into both eyes every 12 (twelve) hours as needed (dry  eyes/irritation).     fluocinonide cream (LIDEX) [id number] % Apply 1 application topically 2 (two) times daily as needed (itching).     IncobotulinumtoxinA (XEOMIN) 200 units SOLR Inject 200 Units into the muscle every 3 (three) months. 2 each 1   IncobotulinumtoxinA (XEOMIN) 200 units SOLR Inject 200 Units into the muscle every 3 (three) months. 1 each 1   levothyroxine (SYNTHROID, LEVOTHROID) 50 MCG tablet Take 75 mcg by mouth daily before breakfast.     lisinopril (ZESTRIL) 10 MG tablet Take 10 mg by mouth every morning.     nitroGLYCERIN (NITROSTAT) 0.4 MG SL tablet Place 1 tablet (0.4 mg total) under the tongue every 5 (five) minutes x 3 doses as needed for chest pain. 25 tablet 0   omeprazole (PRILOSEC) 20 MG capsule Take 20 mg by mouth daily as needed (heartburn/indigestion).      Propylene Glycol (SYSTANE BALANCE) 0.6 % SOLN Place 1 drop into both eyes 4 (four) times daily as needed (dry eyes/irritation).     PROVENTIL HFA 108 (90 BASE) MCG/ACT inhaler Take 2 puffs by mouth every 6 (six) hours as needed for wheezing or shortness of breath.      rOPINIRole (REQUIP) 0.5 MG tablet Take 0.5 mg by mouth at bedtime.     rosuvastatin (CRESTOR) 20 MG tablet Take 20 mg by mouth every Monday, Wednesday, and Friday.      Current Facility-Administered Medications  Medication Dose Route Frequency Provider Last Rate Last Admin   incobotulinumtoxinA (XEOMIN) 100 units injection 100 Units  100 Units Intramuscular Q90 days Marcial Pacas, MD        PAST MEDICAL HISTORY: Past Medical History:  Diagnosis Date   Acute MI anterior wall first episode care ([place]) 09/21/2015   Anemia    Arthritis    back   Coronary artery disease    Dyspnea    "some"   Emphysema lung ([place])    [facility]   Enlarged lymph node    GERD (gastroesophageal reflux disease)    History of hiatal hernia    History of kidney stones    2 lt kidney now  01/22/17   Hypertension    Hypothyroidism    Lung cancer ([place])     small part in the right lower    Pneumonia    as a child    Prostate cancer ([place])    PTSD (post-traumatic stress disorder)    Sleep apnea    sleep study-   [facility]    [place]  [facility]    PAST SURGICAL HISTORY: Past Surgical History:  Procedure Laterality Date   ABDOMINAL EXPOSURE N/A 01/30/2017   Procedure: ABDOMINAL EXPOSURE;  Surgeon: Rosetta Posner, MD;  Location: [facility];  Service: Vascular;  Laterality: N/A;   ANTERIOR LUMBAR FUSION Bilateral 01/30/2017   Procedure: LUMBAR 5-SACRAL 1 ANTERIOR INTERBODY FUSION LUMBAR;  Surgeon: Phylliss Bob, MD;  Location: [facility];  Service: Orthopedics;  Laterality: Bilateral;  LUMBAR 5-SACRUM 1 ANTERIOR INTERBODY FUSION LUMBAR    CARDIAC CATHETERIZATION N/A 09/21/2015   Procedure: Left Heart Cath and Coronary Angiography;  Surgeon: Sherren Mocha, MD;  Location: [facility]  INVASIVE CV LAB;  Service: Cardiovascular;  Laterality: N/A;   CARDIAC CATHETERIZATION Right 09/21/2015   Procedure: Coronary Stent Intervention;  Surgeon: Sherren Mocha, MD;  Location: [facility] CV LAB;  Service: Cardiovascular;  Laterality: Right;   COLONOSCOPY     knee surgery     SHOULDER SURGERY     TONSILLECTOMY     VIDEO BRONCHOSCOPY WITH ENDOBRONCHIAL NAVIGATION Right 04/30/2018   Procedure: VIDEO BRONCHOSCOPY WITH ENDOBRONCHIAL NAVIGATION;  Surgeon: Collene Gobble, MD;  Location: [facility] OR;  Service: Thoracic;  Laterality: Right;   VIDEO BRONCHOSCOPY WITH ENDOBRONCHIAL ULTRASOUND N/A 06/19/2017   Procedure: VIDEO BRONCHOSCOPY WITH ENDOBRONCHIAL ULTRASOUND;  Surgeon: Collene Gobble, MD;  Location: [facility] OR;  Service: Thoracic;  Laterality: N/A;   VIDEO BRONCHOSCOPY WITH ENDOBRONCHIAL ULTRASOUND Right 04/30/2018   Procedure: VIDEO BRONCHOSCOPY WITH ENDOBRONCHIAL ULTRASOUND;  Surgeon: Collene Gobble, MD;  Location: [facility] OR;  Service: Thoracic;  Laterality: Right;    FAMILY HISTORY: Family History  Problem Relation Age of Onset   Coronary artery disease Mother 6   Lung cancer Father     SOCIAL  HISTORY: Social History   Socioeconomic History   Marital status: Married    Spouse name: Not on file   Number of children: Not on file   Years of education: Not on file   Highest education level: Not on file  Occupational History   Occupation: retired  Tobacco Use   Smoking status: Never   Smokeless tobacco: Never  Vaping Use   Vaping Use: Never used  Substance and Sexual Activity   Alcohol use: No    Alcohol/week: 0.0 standard drinks of alcohol   Drug use: No   Sexual activity: Not on file  Other Topics Concern   Not on file  Social History Narrative   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 Ph.D.  [facility] [address] [place], [place] [address] Ph: [phone] Fax: [phone]  CC:  Linden Dolin, PA-C No address on file  Linden Dolin, [place]

## 2023-01-28 ENCOUNTER — Ambulatory Visit: Payer: No Typology Code available for payment source | Admitting: Cardiology

## 2023-02-01 ENCOUNTER — Encounter: Payer: Self-pay | Admitting: Cardiology

## 2023-02-01 ENCOUNTER — Ambulatory Visit: Payer: Medicare Other | Attending: Cardiology | Admitting: Cardiology

## 2023-02-01 VITALS — BP 164/94 | HR 64 | Ht 69.0 in | Wt 159.2 lb

## 2023-02-01 DIAGNOSIS — I10 Essential (primary) hypertension: Secondary | ICD-10-CM | POA: Diagnosis not present

## 2023-02-01 DIAGNOSIS — I2510 Atherosclerotic heart disease of native coronary artery without angina pectoris: Secondary | ICD-10-CM

## 2023-02-01 DIAGNOSIS — G4733 Obstructive sleep apnea (adult) (pediatric): Secondary | ICD-10-CM | POA: Diagnosis not present

## 2023-02-01 DIAGNOSIS — E782 Mixed hyperlipidemia: Secondary | ICD-10-CM | POA: Diagnosis not present

## 2023-02-01 NOTE — Patient Instructions (Signed)
Medication Instructions:  Your physician recommends that you continue on your current medications as directed. Please refer to the Current Medication list given to you today.  *If you need a refill on your cardiac medications before your next appointment, please call your pharmacy*   Lab Work: NONE If you have labs (blood work) drawn today and your tests are completely normal, you will receive your results only by: [facility] (if you have MyChart) OR A paper copy in the mail If you have any lab test that is abnormal or we need to change your treatment, we will call you to review the results.   Testing/Procedures: NONE   Follow-Up: At [facility], you and your health needs are our priority.  As part of our continuing mission to provide you with exceptional heart care, we have created designated Provider Care Teams.  These Care Teams include your primary Cardiologist (physician) and Advanced Practice Providers (APPs -  Physician Assistants and Nurse Practitioners) who all work together to provide you with the care you need, when you need it.  We recommend signing up for the patient portal called "MyChart".  Sign up information is provided on this After Visit Summary.  MyChart is used to connect with patients for Virtual Visits (Telemedicine).  Patients are able to view lab/test results, encounter notes, upcoming appointments, etc.  Non-urgent messages can be sent to your provider as well.   To learn more about what you can do with MyChart, go to [email or web address].    Your next appointment:   1 year(s)  Provider:   Berniece Salines, DO

## 2023-02-01 NOTE — Progress Notes (Signed)
Cardiology Office Note:    Date:  02/01/2023   ID:  Gregory Scott, DOB May 28, 1941, MRN [id number]  PCP:  Linden Dolin, PA-C  Cardiologist:  Berniece Salines, DO  Electrophysiologist:  None   Referring MD: Linden Dolin, PA-C   " I am doing fine"  History of Present Illness:    Gregory Scott is a 82 y.o. male with a hx of coronary artery disease status post microinfarction and now status post PCI to LAD, hypertension, hyperlipidemia, hypothyroidism, emphysema, history of CVA, history of prostate cancer in remission he tells me, GERD here for follow-up visit.   At the first visit they were transitioning from Dr. Terrence Dupont to our office and was seen by me.  During that time he did not have any complaints.  Continue his medications.  He is here today for follow-up visit.  He offers no complaints at this time.  He follows with his primary care at the [place].    Past Medical History:  Diagnosis Date   Acute MI anterior wall first episode care ([place]) 09/21/2015   Anemia    Arthritis    back   Coronary artery disease    Dyspnea    "some"   Emphysema lung ([place])    [facility] sees  [facility]   Enlarged lymph node    GERD (gastroesophageal reflux disease)    History of hiatal hernia    History of kidney stones    2 lt kidney now  01/22/17   Hypertension    Hypothyroidism    Lung cancer ([place])    small part in the right lower    Pneumonia    as a child    Prostate cancer ([place])    PTSD (post-traumatic stress disorder)    Sleep apnea    sleep study-   cpap    [place]  [facility]    Past Surgical History:  Procedure Laterality Date   ABDOMINAL EXPOSURE N/A 01/30/2017   Procedure: ABDOMINAL EXPOSURE;  Surgeon: Rosetta Posner, MD;  Location: [facility];  Service: Vascular;  Laterality: N/A;   ANTERIOR LUMBAR FUSION Bilateral 01/30/2017   Procedure: LUMBAR 5-SACRAL 1 ANTERIOR INTERBODY FUSION LUMBAR;  Surgeon: Phylliss Bob, MD;  Location: [facility];  Service: Orthopedics;  Laterality:  Bilateral;  LUMBAR 5-SACRUM 1 ANTERIOR INTERBODY FUSION LUMBAR    CARDIAC CATHETERIZATION N/A 09/21/2015   Procedure: Left Heart Cath and Coronary Angiography;  Surgeon: Sherren Mocha, MD;  Location: [place] [facility];  Service: Cardiovascular;  Laterality: N/A;   CARDIAC CATHETERIZATION Right 09/21/2015   Procedure: Coronary Stent Intervention;  Surgeon: Sherren Mocha, MD;  Location: [place] [facility];  Service: Cardiovascular;  Laterality: Right;   COLONOSCOPY     knee surgery     SHOULDER SURGERY     TONSILLECTOMY     VIDEO BRONCHOSCOPY WITH ENDOBRONCHIAL NAVIGATION Right 04/30/2018   Procedure: VIDEO BRONCHOSCOPY WITH ENDOBRONCHIAL NAVIGATION;  Surgeon: Collene Gobble, MD;  Location: [facility];  Service: Thoracic;  Laterality: Right;   VIDEO BRONCHOSCOPY WITH ENDOBRONCHIAL ULTRASOUND N/A 06/19/2017   Procedure: VIDEO BRONCHOSCOPY WITH ENDOBRONCHIAL ULTRASOUND;  Surgeon: Collene Gobble, MD;  Location: [facility];  Service: Thoracic;  Laterality: N/A;   VIDEO BRONCHOSCOPY WITH ENDOBRONCHIAL ULTRASOUND Right 04/30/2018   Procedure: VIDEO BRONCHOSCOPY WITH ENDOBRONCHIAL ULTRASOUND;  Surgeon: Collene Gobble, MD;  Location: [facility];  Service: Thoracic;  Laterality: Right;    Current Medications: Current Meds  Medication Sig   acetaminophen-codeine (TYLENOL #  3) 300-30 MG tablet Take 2 tablets by mouth at bedtime.   aspirin EC 81 MG tablet Take 81 mg by mouth every morning.    Carboxymethylcellulose Sod PF 1 % GEL Place 1 drop into both eyes at bedtime as needed (dry eyes/irritation).   cycloSPORINE (RESTASIS) 0.05 % ophthalmic emulsion Place 1 drop into both eyes every 12 (twelve) hours as needed (dry eyes/irritation).   fluocinonide cream (LIDEX) [id number] % Apply 1 application topically 2 (two) times daily as needed (itching).   IncobotulinumtoxinA (XEOMIN) 200 units SOLR Inject 200 Units into the muscle every 3 (three) months.   IncobotulinumtoxinA (XEOMIN) 200 units SOLR Inject 200 Units into the muscle  every 3 (three) months.   levothyroxine (SYNTHROID, LEVOTHROID) 50 MCG tablet Take 75 mcg by mouth daily before breakfast.   lisinopril (ZESTRIL) 10 MG tablet Take 10 mg by mouth every morning.   nitroGLYCERIN (NITROSTAT) 0.4 MG SL tablet Place 1 tablet (0.4 mg total) under the tongue every 5 (five) minutes x 3 doses as needed for chest pain.   omeprazole (PRILOSEC) 20 MG capsule Take 20 mg by mouth daily as needed (heartburn/indigestion).    Propylene Glycol (SYSTANE BALANCE) 0.6 % SOLN Place 1 drop into both eyes 4 (four) times daily as needed (dry eyes/irritation).   PROVENTIL HFA 108 (90 BASE) MCG/ACT inhaler Take 2 puffs by mouth every 6 (six) hours as needed for wheezing or shortness of breath.    rOPINIRole (REQUIP) 0.5 MG tablet Take 0.5 mg by mouth at bedtime.   rosuvastatin (CRESTOR) 20 MG tablet Take 20 mg by mouth every Monday, Wednesday, and Friday.    Current Facility-Administered Medications for the 02/01/23 encounter (Office Visit) with Berniece Salines, DO  Medication   incobotulinumtoxinA (XEOMIN) 100 units injection 100 Units   IncobotulinumtoxinA SOLR 200 Units     Allergies:   Statins   Social History   Socioeconomic History   Marital status: Married    Spouse name: Not on file   Number of children: Not on file   Years of education: Not on file   Highest education level: Not on file  Occupational History   Occupation: retired  Tobacco Use   Smoking status: Never   Smokeless tobacco: Never  Vaping Use   Vaping Use: Never used  Substance and Sexual Activity   Alcohol use: No    Alcohol/week: 0.0 standard drinks of alcohol   Drug use: No   Sexual activity: Not on file  Other Topics Concern   Not on file  Social History Narrative   Worked in a English as a second language teacher   Married   Nonsmoker   Social Determinants of Radio broadcast assistant Strain: Not on file  Food Insecurity: Not on file  Transportation Needs: Not on file  Physical Activity: Not on file  Stress: Not  on file  Social Connections: Not on file     Family History: The patient's family history includes Coronary artery disease (age of onset: 54) in his mother; Lung cancer in his father.  ROS:   Review of Systems  Constitution: Negative for decreased appetite, fever and weight gain.  HENT: Negative for congestion, ear discharge, hoarse voice and sore throat.   Eyes: Negative for discharge, redness, vision loss in right eye and visual halos.  Cardiovascular: Negative for chest pain, dyspnea on exertion, leg swelling, orthopnea and palpitations.  Respiratory: Negative for cough, hemoptysis, shortness of breath and snoring.   Endocrine: Negative for heat intolerance and polyphagia.  Hematologic/Lymphatic: Negative  for bleeding problem. Does not bruise/bleed easily.  Skin: Negative for flushing, nail changes, rash and suspicious lesions.  Musculoskeletal: Negative for arthritis, joint pain, muscle cramps, myalgias, neck pain and stiffness.  Gastrointestinal: Negative for abdominal pain, bowel incontinence, diarrhea and excessive appetite.  Genitourinary: Negative for decreased libido, genital sores and incomplete emptying.  Neurological: Negative for brief paralysis, focal weakness, headaches and loss of balance.  Psychiatric/Behavioral: Negative for altered mental status, depression and suicidal ideas.  Allergic/Immunologic: Negative for HIV exposure and persistent infections.    EKGs/Labs/Other Studies Reviewed:    The following studies were reviewed today:   EKG: None today  TTE 10/18/2020 IMPRESSIONS   1. Left ventricular ejection fraction, by estimation, is 55 to 60%. The left ventricle has normal function. The left ventricle has no regional  wall motion abnormalities. There is mild left ventricular hypertrophy. Left ventricular diastolic parameters  were normal.   2. Right ventricular systolic function is normal. The right ventricular size is normal.   3. The mitral valve is  normal in structure. Trivial mitral valve regurgitation.   4. The aortic valve is normal in structure. Aortic valve regurgitation is not visualized.   5. The inferior vena cava is normal in size with greater than 50% respiratory variability, suggesting right atrial pressure of 3 mmHg.   FINDINGS   Left Ventricle: Left ventricular ejection fraction, by estimation, is 55 to 60%. The left ventricle has normal function. The left ventricle has no  regional wall motion abnormalities. The left ventricular internal cavity size was normal in size. There is   mild left ventricular hypertrophy. Left ventricular diastolic parameters were normal.   Right Ventricle: The right ventricular size is normal. No increase in right ventricular wall thickness. Right ventricular systolic function is  normal.   Left Atrium: Left atrial size was normal in size.   Right Atrium: Right atrial size was normal in size.   Pericardium: There is no evidence of pericardial effusion.   Mitral Valve: The mitral valve is normal in structure. Trivial mitral valve regurgitation.   Tricuspid Valve: The tricuspid valve is normal in structure. Tricuspid valve regurgitation is not demonstrated.   Aortic Valve: The aortic valve is normal in structure. Aortic valve regurgitation is not visualized.   Pulmonic Valve: The pulmonic valve was normal in structure. Pulmonic valve regurgitation is trivial.   Aorta: The aortic root is normal in size and structure.   Venous: The inferior vena cava is normal in size with greater than 50% respiratory variability, suggesting right atrial pressure of 3 mmHg.   IAS/Shunts: No atrial level shunt detected by color flow Doppler.       Recent Labs: No results found for requested labs within last 365 days.  Recent Lipid Panel    Component Value Date/Time   CHOL 167 10/17/2020 1111   TRIG 123 10/17/2020 1111   HDL 50 10/17/2020 1111   CHOLHDL 3.3 10/17/2020 1111   VLDL 25 10/17/2020 1111    LDLCALC 92 10/17/2020 1111    Physical Exam:    VS:  BP (!) 164/94   Pulse 64   Ht 5\' 9"  (1.753 m)   Wt 72.2 kg   SpO2 96%   BMI 23.51 kg/m     Wt Readings from Last 3 Encounters:  02/01/23 72.2 kg  01/23/23 71.2 kg  09/13/22 71.4 kg     GEN: Well nourished, well developed in no acute distress HEENT: Normal NECK: No JVD; No carotid bruits LYMPHATICS: No lymphadenopathy CARDIAC:  S1S2 noted,RRR, no murmurs, rubs, gallops RESPIRATORY:  Clear to auscultation without rales, wheezing or rhonchi  ABDOMEN: Soft, non-tender, non-distended, +bowel sounds, no guarding. EXTREMITIES: No edema, No cyanosis, no clubbing MUSCULOSKELETAL:  No deformity  SKIN: Warm and dry NEUROLOGIC:  Alert and oriented x 3, non-focal PSYCHIATRIC:  Normal affect, good insight  ASSESSMENT:    1. Essential hypertension   2. Coronary artery disease involving native coronary artery of native heart without angina pectoris   3. Obstructive sleep apnea syndrome   4. Mixed hyperlipidemia     PLAN:    1.  Coronary artery disease-he does not have any anginal symptoms at this time.  We will continue aspirin and statin.    2.  Hypertension-blood pressure elevated.  He tells me his blood pressure at home is running normal-I have asked the patient take his blood pressure daily send me updates in 1 week.  Should this be elevated at that time I plan to add amlodipine 5 mg to his regimen.  3.  Hyperlipidemia - continue with current statin medication.  4.  I reviewed his blood work which was done at the [place] in February 2024 WBC 5.66, RBC 4.26, hemoglobin 14.3, hematocrit 39.4, platelet count 198. Chemistry sodium 9.3, chloride 106, bicarb 26, creatinine 1.06, glucose 92, potassium 4.2, sodium 136,.  LFN, albumin 4.0, alkaline phosphatase 67, ALT 36, AST 30, direct bili 0.2, total bili 0.9, plasma protein 7.5.   The patient is in agreement with the above plan. The patient left the office in stable condition.  The  patient will follow up in 12 months or sooner if needed.   Medication Adjustments/Labs and Tests Ordered: Current medicines are reviewed at length with the patient today.  Concerns regarding medicines are outlined above.  No orders of the defined types were placed in this encounter.  No orders of the defined types were placed in this encounter.   There are no Patient Instructions on file for this visit.   Adopting a Healthy Lifestyle.  Know what a healthy weight is for you (roughly BMI <25) and aim to maintain this   Aim for 7+ servings of fruits and vegetables daily   65-80+ fluid ounces of water or unsweet tea for healthy kidneys   Limit to max 1 drink of alcohol per day; avoid smoking/tobacco   Limit animal fats in diet for cholesterol and heart health - choose grass fed whenever available   Avoid highly processed foods, and foods high in saturated/trans fats   Aim for low stress - take time to unwind and care for your mental health   Aim for 150 min of moderate intensity exercise weekly for heart health, and weights twice weekly for bone health   Aim for 7-9 hours of sleep daily   When it comes to diets, agreement about the perfect plan isnt easy to find, even among the experts. Experts at the [employer] developed an idea known as the Healthy Eating Plate. Just imagine a plate divided into logical, healthy portions.   The emphasis is on diet quality:   Load up on vegetables and fruits - one-half of your plate: Aim for color and variety, and remember that potatoes dont count.   Go for whole grains - one-quarter of your plate: Whole wheat, barley, wheat berries, quinoa, oats, brown rice, and foods made with them. If you want pasta, go with whole wheat pasta.   Protein power - one-quarter of your plate: Fish, chicken, beans,  and nuts are all healthy, versatile protein sources. Limit red meat.   The diet, however, does go beyond the plate, offering a  few other suggestions.   Use healthy plant oils, such as olive, canola, soy, corn, sunflower and peanut. Check the labels, and avoid partially hydrogenated oil, which have unhealthy trans fats.   If youre thirsty, drink water. Coffee and tea are good in moderation, but skip sugary drinks and limit milk and dairy products to one or two daily servings.   The type of carbohydrate in the diet is more important than the amount. Some sources of carbohydrates, such as vegetables, fruits, whole grains, and beans-are healthier than others.   Finally, stay active  Signed, Berniece Salines, DO  02/01/2023 9:58 AM    [place]

## 2023-02-07 DIAGNOSIS — C61 Malignant neoplasm of prostate: Secondary | ICD-10-CM | POA: Diagnosis not present

## 2023-02-11 DIAGNOSIS — Z6826 Body mass index (BMI) 26.0-26.9, adult: Secondary | ICD-10-CM | POA: Diagnosis not present

## 2023-02-11 DIAGNOSIS — M47812 Spondylosis without myelopathy or radiculopathy, cervical region: Secondary | ICD-10-CM | POA: Diagnosis not present

## 2023-02-11 DIAGNOSIS — M5412 Radiculopathy, cervical region: Secondary | ICD-10-CM | POA: Diagnosis not present

## 2023-03-25 ENCOUNTER — Other Ambulatory Visit: Payer: Self-pay

## 2023-03-25 MED ORDER — XEOMIN 200 UNITS IM SOLR: 200.0000 [IU] | INTRAMUSCULAR | 1 refills | Status: AC

## 2023-04-09 ENCOUNTER — Telehealth: Payer: Self-pay | Admitting: Neurology

## 2023-04-09 NOTE — Telephone Encounter (Signed)
LVM and sent mychart msg informing pt of appt change due to provider schedule change

## 2023-04-23 ENCOUNTER — Telehealth: Payer: Self-pay | Admitting: Neurology

## 2023-04-23 NOTE — Telephone Encounter (Signed)
error 

## 2023-04-23 NOTE — Telephone Encounter (Signed)
Received fax from [facility] that Xeomin will be delivered for pt on 04/29/23.

## 2023-04-24 ENCOUNTER — Telehealth: Payer: Self-pay | Admitting: Cardiovascular Disease

## 2023-04-24 NOTE — Telephone Encounter (Signed)
  Patient has requested to transition from Dr. Servando Salina to Dr. Royann Shivers.    Per our policy, I am sending this message for both physicians to acknowledge and accept this request.

## 2023-04-29 NOTE — Telephone Encounter (Signed)
Received (1) 200 unit vial of Xeomin

## 2023-04-30 DIAGNOSIS — M720 Palmar fascial fibromatosis [Dupuytren]: Secondary | ICD-10-CM | POA: Diagnosis not present

## 2023-05-10 DIAGNOSIS — C61 Malignant neoplasm of prostate: Secondary | ICD-10-CM | POA: Diagnosis not present

## 2023-05-15 ENCOUNTER — Ambulatory Visit (INDEPENDENT_AMBULATORY_CARE_PROVIDER_SITE_OTHER): Payer: Medicare Other | Admitting: Neurology

## 2023-05-15 VITALS — BP 162/89

## 2023-05-15 DIAGNOSIS — G243 Spasmodic torticollis: Secondary | ICD-10-CM

## 2023-05-15 DIAGNOSIS — M542 Cervicalgia: Secondary | ICD-10-CM

## 2023-05-15 MED ORDER — INCOBOTULINUMTOXINA 200 UNITS IM SOLR: 200.0000 [IU] | Freq: Once | INTRAMUSCULAR | Status: AC

## 2023-05-15 NOTE — Progress Notes (Signed)
xenomin 200units x 1 vial  [id number] [phone] Exp-06/2025 s/p Bacteriostatic 0.9% Sodium Chloride- 4mL  [id number] Expiration: 02/18/24 NDC: [id number] Dx: g24.3 WITNESSED EX:[id number] jones rn

## 2023-05-15 NOTE — Progress Notes (Signed)
Chief Complaint  Patient presents with   Procedure    Rm14, wife present, pt is well and ready for injection      ASSESSMENT AND PLAN  Gregory Scott is a 82 y.o. 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 Chronic neck pain  MRI of the cervical spine showed multilevel degenerative changes, most prominent C4-5, with moderate left neuroforaminal stenosis, no canal stenosis, was sent to spine specialist, not surgical candidate,  First EMG guided xeomin injection on October 24, 2022,  EMG guided xeomin injection, use 200 units, frequent head titubation, mild right tilt, retrocollis   Right splenius capitis 25 units Right splenius cervix 25 unitsx2=50 units Right levator scapular 25 units  Left levator scapula 25 units Left splenius cervix 25 units Left splenius capitis 25 units Left inferior oblique capitis 25 units     DIAGNOSTIC DATA (LABS, IMAGING, TESTING) - I reviewed patient records, labs, notes, testing and imaging myself where available.   MEDICAL HISTORY:  Gregory Scott is a 82 year old male, seen in request by his primary care PA Wynona Neat, [place] for evaluation of tremor, gait abnormality,  I reviewed and summarized the referring note.PMHX. Chronic low back pain. Hypothyroidism' HLD HTN CAD Emphysema History of Kidney stone, PTSD,  OSA--could not tolerate CPAP  Reported more than 15 years history of gradual onset head titubation, only mild hand tremor, no limitation on his daily activities, he likes to work on his cars, and yard, he denies a family history of tremor  He complains of long history of chronic low back pain, getting worse over the past few years, now often feel bilateral lower extremity numbness achy pain, from knee down, frequent bilateral lower extremity muscle cramping, leg feels cold, sometimes also have hands muscle cramp, at night, sometimes has difficulty finding a comfortable position, difficulty  sleeping because of that  He was diagnosed with restless leg syndrome, taking Requip 0.5 mg seems to help him some, previously tried gabapentin and Lyrica did not help  UPDATE Sep 13 2022: Personally reviewed MRI of the cervical spine in July 2023, multilevel degenerative changes, most prominent C4-5, broad disc osteophyte protrusion, facet hypertrophy with moderate left mild right foraminal narrowing, no canal stenosis  He was seen by spine specialist, not surgical candidate  He remains active, drive his mobile home for travel regularly, 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  Update May 15, 2023: Injection was helpful, he denies significant side effect, helped his neck pain as well,  PHYSICAL EXAM:     Frequent head titubation, mild right tilt, retrocollis, slight left shoulder elevation  REVIEW OF SYSTEMS:  Full 14 system review of systems performed and notable only for as above All other review of systems were negative.   ALLERGIES: Allergies  Allergen Reactions   Statins Other (See Comments)    MYALGIAS Legs hurt in high doses    HOME MEDICATIONS: Current Outpatient Medications  Medication Sig Dispense Refill   acetaminophen-codeine (TYLENOL #3) 300-30 MG tablet Take 2 tablets by mouth at bedtime.  aspirin EC 81 MG tablet Take 81 mg by mouth every morning.      Carboxymethylcellulose Sod PF 1 % GEL Place 1 drop into both eyes at bedtime as needed (dry eyes/irritation).     cycloSPORINE (RESTASIS) 0.05 %  ophthalmic emulsion Place 1 drop into both eyes every 12 (twelve) hours as needed (dry eyes/irritation).     fluocinonide cream (LIDEX) 0.05 % Apply 1 application topically 2 (two) times daily as needed (itching).     IncobotulinumtoxinA (XEOMIN) 200 units SOLR Inject 200 Units into the muscle every 3 (three) months. 2 each 1   IncobotulinumtoxinA (XEOMIN) 200 units SOLR Inject 200 Units into the muscle every 3 (three) months. 1 each 1   levothyroxine (SYNTHROID, LEVOTHROID) 50 MCG tablet Take 75 mcg by mouth daily before breakfast.     lisinopril (ZESTRIL) 10 MG tablet Take 10 mg by mouth every morning.     nitroGLYCERIN (NITROSTAT) 0.4 MG SL tablet Place 1 tablet (0.4 mg total) under the tongue every 5 (five) minutes x 3 doses as needed for chest pain. 25 tablet 0   omeprazole (PRILOSEC) 20 MG capsule Take 20 mg by mouth daily as needed (heartburn/indigestion).      Propylene Glycol (SYSTANE BALANCE) 0.6 % SOLN Place 1 drop into both eyes 4 (four) times daily as needed (dry eyes/irritation).     PROVENTIL HFA 108 (90 BASE) MCG/ACT inhaler Take 2 puffs by mouth every 6 (six) hours as needed for wheezing or shortness of breath.      rOPINIRole (REQUIP) 0.5 MG tablet Take 0.5 mg by mouth at bedtime.     rosuvastatin (CRESTOR) 20 MG tablet Take 20 mg by mouth every Monday, Wednesday, and Friday.      Current Facility-Administered Medications  Medication Dose Route Frequency Provider Last Rate Last Admin   incobotulinumtoxinA (XEOMIN) 100 units injection 100 Units  100 Units Intramuscular Q90 days Levert Feinstein, MD       IncobotulinumtoxinA SOLR 200 Units  200 Units Intramuscular Once Levert Feinstein, MD       IncobotulinumtoxinA SOLR 200 Units  200 Units Intramuscular Once Levert Feinstein, MD        PAST MEDICAL HISTORY: Past Medical History:  Diagnosis Date   Acute MI anterior wall first episode care (HCC) 09/21/2015   Anemia    Arthritis    back   Coronary artery disease    Dyspnea    "some"    Emphysema lung (HCC)    [facility]  [facility]   Enlarged lymph node    GERD (gastroesophageal reflux disease)    History of hiatal hernia    History of kidney stones    2 lt kidney now  01/22/17   Hypertension    Hypothyroidism    Lung cancer (HCC)    small part in the right lower    Pneumonia    as a child    Prostate cancer (HCC)    PTSD (post-traumatic stress disorder)    Sleep apnea    sleep study-   [facility]ap    [place]  [facility]    PAST SURGICAL HISTORY: Past Surgical History:  Procedure Laterality Date   ABDOMINAL EXPOSURE N/A 01/30/2017   Procedure: ABDOMINAL EXPOSURE;  Surgeon: Larina Earthly, MD;  Location: [facility] OR;  Service: Vascular;  Laterality: N/A;   ANTERIOR LUMBAR FUSION Bilateral 01/30/2017   Procedure: LUMBAR 5-SACRAL 1 ANTERIOR INTERBODY FUSION LUMBAR;  Surgeon: Estill Bamberg, MD;  [facility];  Service: Orthopedics;  Laterality: Bilateral;  LUMBAR 5-SACRUM 1 ANTERIOR INTERBODY FUSION LUMBAR    CARDIAC CATHETERIZATION N/A 09/21/2015   Procedure: Left Heart Cath and Coronary Angiography;  Surgeon: Tonny Bollman, MD;  Location: [facility] INVASIVE CV LAB;  Service: Cardiovascular;  Laterality: N/A;   CARDIAC CATHETERIZATION Right 09/21/2015   Procedure: Coronary Stent Intervention;  Surgeon: Tonny Bollman, MD;  Location: [facility] INVASIVE CV LAB;  Service: Cardiovascular;  Laterality: Right;   COLONOSCOPY     knee surgery     SHOULDER SURGERY     TONSILLECTOMY     VIDEO BRONCHOSCOPY WITH ENDOBRONCHIAL NAVIGATION Right 04/30/2018   Procedure: VIDEO BRONCHOSCOPY WITH ENDOBRONCHIAL NAVIGATION;  Surgeon: Leslye Peer, MD;  Location: [facility] OR;  Service: Thoracic;  Laterality: Right;   VIDEO BRONCHOSCOPY WITH ENDOBRONCHIAL ULTRASOUND N/A 06/19/2017   Procedure: VIDEO BRONCHOSCOPY WITH ENDOBRONCHIAL ULTRASOUND;  Surgeon: Leslye Peer, MD;  [facility] OR;  Service: Thoracic;  Laterality: N/A;   VIDEO BRONCHOSCOPY WITH ENDOBRONCHIAL ULTRASOUND Right 04/30/2018   Procedure: VIDEO  BRONCHOSCOPY WITH ENDOBRONCHIAL ULTRASOUND;  Surgeon: Leslye Peer, MD;  Location[facility] OR;  Service: Thoracic;  Laterality: Right;    FAMILY HISTORY: Family History  Problem Relation Age of Onset   Coronary artery disease Mother 51   Lung cancer Father     SOCIAL HISTORY: Social History   Socioeconomic History   Marital status: Married    Spouse name: Not on file   Number of children: Not on file   Years of education: Not on file   Highest education level: Not on file  Occupational History   Occupation: retired  Tobacco Use   Smoking status: Never   Smokeless tobacco: Never  Vaping Use   Vaping Use: Never used  Substance and Sexual Activity   Alcohol use: No    Alcohol/week: 0.0 standard drinks of alcohol   Drug use: No   Sexual activity: Not on file  Other Topics Concern   Not on file  Social History Narrative   Worked in a Social research officer, government   Married   Nonsmoker   Social Determinants of Corporate investment banker Strain: Not on file  Food Insecurity: Not on file  Transportation Needs: Not on file  Physical Activity: Not on file  Stress: Not on file  Social Connections: Not on file  Intimate Partner Violence: Not on file      Levert Feinstein, M.D. Ph.D.  [facility] [address] [place], [place] [address] Ph: [phone] Fax: [phone]  CC:  Vertell Limber, PA-C No address on file  Vertell Limber, [place]

## 2023-05-29 ENCOUNTER — Ambulatory Visit: Payer: Medicare Other | Admitting: Neurology

## 2023-06-24 LAB — LAB REPORT - SCANNED: EGFR: 67

## 2023-07-16 ENCOUNTER — Telehealth: Payer: Self-pay | Admitting: Neurology

## 2023-07-16 DIAGNOSIS — G243 Spasmodic torticollis: Secondary | ICD-10-CM

## 2023-07-16 MED ORDER — XEOMIN 200 UNITS IM SOLR: 200.0000 [IU] | INTRAMUSCULAR | 2 refills | Status: AC

## 2023-07-16 NOTE — Telephone Encounter (Signed)
Xenomin sent to [facility]

## 2023-07-16 NOTE — Telephone Encounter (Signed)
Pt request change of pharmacy to [facility] for [facility]ulinumtoxin[facility]) 200 units SOLR. [facility]. Pt did not have the information to where to send the prescription, will call back the information.

## 2023-07-16 NOTE — Telephone Encounter (Signed)
Phone room: Please call and obtain the information regarding what pharmacy to send the botox to. Thanks,  Production assistant, radio

## 2023-07-30 MED ORDER — ABOBOTULINUMTOXINA 500 UNITS IM SOLR: 500.0000 [IU] | Freq: Once | INTRAMUSCULAR | 0 refills | Status: AC

## 2023-07-30 MED ORDER — ABOBOTULINUMTOXINA 500 UNITS IM SOLR: 500.0000 [IU] | Freq: Once | INTRAMUSCULAR | Status: DC

## 2023-07-30 NOTE — Telephone Encounter (Signed)
[facility]

## 2023-07-30 NOTE — Telephone Encounter (Signed)
I returned Jason's call, he was from the [place] pharmacy that [facility] sent rx to. He just wanted to know if he needed to ship pt's Xeomin to [place]. I verified that it will need to be shipped and confirmed our address as well as informed him that we are closed Fridays. He will call me with a delivery date once this has been set up, I gave him my direct #.

## 2023-07-30 NOTE — Telephone Encounter (Signed)
[facility] Would like a call back to discuss for Xeomin if pt picks up or if we mail it. . How does your office handle that. [phone] ext [phone]

## 2023-07-30 NOTE — Telephone Encounter (Signed)
I haven't worked with the [facility] pharmacy before, your below note states you sent it to a [place] pharmacy already?

## 2023-07-30 NOTE — Telephone Encounter (Signed)
It is OK to switch to dysport 500 units

## 2023-07-30 NOTE — Telephone Encounter (Signed)
Barbara Cower from the [facility] pharmacy called again, he states they don't care Xeomin. He wants to know if we would be agreeable to switch to Dysport instead.

## 2023-07-30 NOTE — Addendum Note (Signed)
Addended by: Danne Harbor on: 07/30/2023 03:22 PM   Modules accepted: Orders

## 2023-07-30 NOTE — Telephone Encounter (Signed)
Phone room: He would have to discuss that with the pharmacy. Thanks,  Production assistant, radio

## 2023-07-30 NOTE — Telephone Encounter (Signed)
Please send rx for Dysport to [facility].

## 2023-07-30 NOTE — Telephone Encounter (Signed)
Barbara Cower, [place] [facility] called to speak with [facility]. Jillian instructed to send call to  vm [phone]

## 2023-08-06 ENCOUNTER — Telehealth: Payer: Self-pay

## 2023-08-06 NOTE — Telephone Encounter (Signed)
[facility]

## 2023-08-15 DIAGNOSIS — C61 Malignant neoplasm of prostate: Secondary | ICD-10-CM | POA: Diagnosis not present

## 2023-08-21 ENCOUNTER — Ambulatory Visit (INDEPENDENT_AMBULATORY_CARE_PROVIDER_SITE_OTHER): Payer: Medicare Other | Admitting: Neurology

## 2023-08-21 ENCOUNTER — Encounter: Payer: Self-pay | Admitting: Neurology

## 2023-08-21 VITALS — BP 169/84

## 2023-08-21 DIAGNOSIS — G243 Spasmodic torticollis: Secondary | ICD-10-CM

## 2023-08-21 MED ORDER — ABOBOTULINUMTOXINA 500 UNITS IM SOLR: 500.0000 [IU] | Freq: Once | INTRAMUSCULAR | Status: AC

## 2023-08-21 NOTE — Progress Notes (Signed)
Chief Complaint  Patient presents with   Injections    Rm14, alone, Pt is well and ready for injection      ASSESSMENT AND PLAN  Gregory Scott is a 82 y.o. 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 Chronic neck pain  MRI of the cervical spine showed multilevel degenerative changes, most prominent C4-5, with moderate left neuroforaminal stenosis, no canal stenosis, was sent to spine specialist, not surgical candidate,  First EMG guided xeomin injection on October 24, 2022,   Frequent head titubation, mild right tilt, retrocollis  EMG guided Dysport injection, 500 units from [facility] specialty pharmacy (starting September 07, 2013) 500 units of Dysport/2.5 cc of normal saline   Right splenius capitis 0.5 cc Right splenius cervix 0.5 cc Right levator scapula 0.5 cc  Left levator scapula 0.5 cc Left splenius cervix 0.5 cc       DIAGNOSTIC DATA (LABS, IMAGING, TESTING) - I reviewed patient records, labs, notes, testing and imaging myself where available.   MEDICAL HISTORY:  Gregory Scott is a 82 year old male, seen in request by his primary care PA Wynona Neat, [place] for evaluation of tremor, gait abnormality,  I reviewed and summarized the referring note.PMHX. Chronic low back pain. Hypothyroidism' HLD HTN CAD Emphysema History of Kidney stone, PTSD,  OSA--could not tolerate CPAP  Reported more than 15 years history of gradual onset head titubation, only mild hand tremor, no limitation on his daily activities, he likes to work on his cars, and yard, he denies a family history of tremor  He complains of long history of chronic low back pain, getting worse over the past few years, now often feel bilateral lower extremity numbness achy pain, from knee down, frequent bilateral lower extremity muscle cramping, leg feels cold, sometimes also have hands muscle cramp, at night, sometimes has difficulty finding a comfortable position,  difficulty sleeping because of that  He was diagnosed with restless leg syndrome, taking Requip 0.5 mg seems to help him some, previously tried gabapentin and Lyrica did not help  UPDATE Sep 13 2022: Personally reviewed MRI of the cervical spine in July 2023, multilevel degenerative changes, most prominent C4-5, broad disc osteophyte protrusion, facet hypertrophy with moderate left mild right foraminal narrowing, no canal stenosis  He was seen by spine specialist, not surgical candidate  He remains active, drive his mobile home for travel regularly, 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  Update May 15, 2023: Injection was helpful, he denies significant side effect, helped his neck pain as well,  UPDATE Aug 21 2023: He is now getting Dysport 500 cc   through [facility] specialty pharmacy, responding well to previous injection, reported 50% improvement, for 3 weeks, following injection, he was pain-free, then gradual onset deep achy pain again   PHYSICAL EXAM:     Frequent head titubation, mild right tilt, retrocollis, slight left shoulder elevation  REVIEW OF SYSTEMS:  Full 14 system review of systems performed and notable only for as above All other review of systems were negative.   ALLERGIES:  Allergies  Allergen Reactions   Statins Other (See Comments)    MYALGIAS Legs hurt in high doses    HOME MEDICATIONS: Current Outpatient Medications  Medication Sig Dispense Refill   acetaminophen-codeine (TYLENOL #3) 300-30 MG  tablet Take 2 tablets by mouth at bedtime.     aspirin EC 81 MG tablet Take 81 mg by mouth every morning.      Carboxymethylcellulose Sod PF 1 % GEL Place 1 drop into both eyes at bedtime as needed (dry eyes/irritation).     cycloSPORINE (RESTASIS) 0.05 % ophthalmic emulsion Place 1 drop into both eyes every 12 (twelve) hours as needed (dry eyes/irritation).     fluocinonide cream (LIDEX) 0.05 % Apply 1 application topically 2 (two) times daily as needed (itching).     IncobotulinumtoxinA (XEOMIN) 200 units SOLR Inject 200 Units into the muscle every 3 (three) months. 2 each 1   IncobotulinumtoxinA (XEOMIN) 200 units SOLR Inject 200 Units into the muscle every 3 (three) months. 1 each 1   IncobotulinumtoxinA (XEOMIN) 200 units SOLR Inject 200 Units into the muscle every 3 (three) months. 1 each 2   levothyroxine (SYNTHROID, LEVOTHROID) 50 MCG tablet Take 75 mcg by mouth daily before breakfast.     lisinopril (ZESTRIL) 10 MG tablet Take 10 mg by mouth every morning.     nitroGLYCERIN (NITROSTAT) 0.4 MG SL tablet Place 1 tablet (0.4 mg total) under the tongue every 5 (five) minutes x 3 doses as needed for chest pain. 25 tablet 0   omeprazole (PRILOSEC) 20 MG capsule Take 20 mg by mouth daily as needed (heartburn/indigestion).      Propylene Glycol (SYSTANE BALANCE) 0.6 % SOLN Place 1 drop into both eyes 4 (four) times daily as needed (dry eyes/irritation).     PROVENTIL HFA 108 (90 BASE) MCG/ACT inhaler Take 2 puffs by mouth every 6 (six) hours as needed for wheezing or shortness of breath.      rOPINIRole (REQUIP) 0.5 MG tablet Take 0.5 mg by mouth at bedtime.     rosuvastatin (CRESTOR) 20 MG tablet Take 20 mg by mouth every Monday, Wednesday, and Friday.      Current Facility-Administered Medications  Medication Dose Route Frequency Provider Last Rate Last Admin   AbobotulinumtoxinA (DYSPORT) 500 units injection 500 Units  500 Units Intramuscular Once Levert Feinstein, MD       incobotulinumtoxinA  (XEOMIN) 100 units injection 100 Units  100 Units Intramuscular Q90 days Levert Feinstein, MD       IncobotulinumtoxinA SOLR 200 Units  200 Units Intramuscular Once Levert Feinstein, MD       IncobotulinumtoxinA SOLR 200 Units  200 Units Intramuscular Once Levert Feinstein, MD        PAST MEDICAL HISTORY: Past Medical History:  Diagnosis Date   Acute MI anterior wall first episode care (HCC) 09/21/2015   Anemia    Arthritis    back   Coronary artery disease    Dyspnea    "some"   Emphysema lung (HCC)    [facility]  [facility]   Enlarged lymph node    GERD (gastroesophageal reflux disease)    History of hiatal hernia    History of kidney stones    2 lt kidney now  01/22/17   Hypertension    Hypothyroidism    Lung cancer (HCC)    small part in the right lower    Pneumonia    as a child    Prostate cancer (HCC)  PTSD (post-traumatic stress disorder)    Sleep apnea    sleep study-   cpap    [place]  [facility]    PAST SURGICAL HISTORY: Past Surgical History:  Procedure Laterality Date   ABDOMINAL EXPOSURE N/A 01/30/2017   Procedure: ABDOMINAL EXPOSURE;  Surgeon: Larina Earthly, MD;  Location: [facility] OR;  Service: Vascular;  Laterality: N/A;   ANTERIOR LUMBAR FUSION Bilateral 01/30/2017   Procedure: LUMBAR 5-SACRAL 1 ANTERIOR INTERBODY FUSION LUMBAR;  Surgeon: Estill Bamberg, MD;  [facility] OR;  Service: Orthopedics;  Laterality: Bilateral;  LUMBAR 5-SACRUM 1 ANTERIOR INTERBODY FUSION LUMBAR    CARDIAC CATHETERIZATION N/A 09/21/2015   Procedure: Left Heart Cath and Coronary Angiography;  Surgeon: Tonny Bollman, MD;  Location[facility] INVASIVE CV LAB;  Service: Cardiovascular;  Laterality: N/A;   CARDIAC CATHETERIZATION Right 09/21/2015   Procedure: Coronary Stent Intervention;  Surgeon: Tonny Bollman, MD;  Location: [facility] INVASIVE CV LAB;  Service: Cardiovascular;  Laterality: Right;   COLONOSCOPY     knee surgery     SHOULDER SURGERY     TONSILLECTOMY     VIDEO BRONCHOSCOPY WITH ENDOBRONCHIAL NAVIGATION  Right 04/30/2018   Procedure: VIDEO BRONCHOSCOPY WITH ENDOBRONCHIAL NAVIGATION;  Surgeon: Leslye Peer, MD;  Location[facility] OR;  Service: Thoracic;  Laterality: Right;   VIDEO BRONCHOSCOPY WITH ENDOBRONCHIAL ULTRASOUND N/A 06/19/2017   Procedure: VIDEO BRONCHOSCOPY WITH ENDOBRONCHIAL ULTRASOUND;  Surgeon: Leslye Peer, MD;  [facility] OR;  Service: Thoracic;  Laterality: N/A;   VIDEO BRONCHOSCOPY WITH ENDOBRONCHIAL ULTRASOUND Right 04/30/2018   Procedure: VIDEO BRONCHOSCOPY WITH ENDOBRONCHIAL ULTRASOUND;  Surgeon: Leslye Peer, MD;  [facility] OR;  Service: Thoracic;  Laterality: Right;    FAMILY HISTORY: Family History  Problem Relation Age of Onset   Coronary artery disease Mother 38   Lung cancer Father     SOCIAL HISTORY: Social History   Socioeconomic History   Marital status: Married    Spouse name: Not on file   Number of children: Not on file   Years of education: Not on file   Highest education level: Not on file  Occupational History   Occupation: retired  Tobacco Use   Smoking status: Never   Smokeless tobacco: Never  Vaping Use   Vaping status: Never Used  Substance and Sexual Activity   Alcohol use: No    Alcohol/week: 0.0 standard drinks of alcohol   Drug use: No   Sexual activity: Not on file  Other Topics Concern   Not on file  Social History Narrative   Worked in a Social research officer, government   Married   Nonsmoker   Social Determinants of Corporate investment banker Strain: Not on file  Food Insecurity: Not on file  Transportation Needs: Not on file  Physical Activity: Not on file  Stress: Not on file  Social Connections: Not on file  Intimate Partner Violence: Not on file      Levert Feinstein, M.D. Ph.D.  [facility] [address], [address] [place], [place] [address] Ph: [phone] Fax: [phone]  CC:  Vertell Limber, PA-C No address on file  Vertell Limber, [place]

## 2023-08-21 NOTE — Progress Notes (Signed)
Dysport 500units x 1 vial  [id number] Exp-02.28.2026 S/P Bacteriostatic 0.9% Sodium Chloride- 2.5 mL  Lot: [id number] Expiration: 04.01.2025 NDC: [id number] Dx: G24.3 WITNESSED LK:GMWNU, RN

## 2023-09-12 DIAGNOSIS — M25562 Pain in left knee: Secondary | ICD-10-CM | POA: Diagnosis not present

## 2023-11-02 DIAGNOSIS — M5441 Lumbago with sciatica, right side: Secondary | ICD-10-CM | POA: Diagnosis not present

## 2023-11-02 DIAGNOSIS — M5442 Lumbago with sciatica, left side: Secondary | ICD-10-CM | POA: Diagnosis not present

## 2023-11-18 ENCOUNTER — Ambulatory Visit: Payer: Medicare Other | Admitting: Neurology

## 2023-11-18 MED ORDER — DYSPORT 500 UNITS IM SOLR: 500.0000 [IU] | Freq: Once | INTRAMUSCULAR | 0 refills | Status: AC

## 2023-11-18 NOTE — Telephone Encounter (Signed)
Dysport sent as requested

## 2023-11-18 NOTE — Telephone Encounter (Signed)
[employer] FEP auth#: [id number] (08/20/23-11/17/24)

## 2023-11-18 NOTE — Telephone Encounter (Signed)
Please send refill for Dysport 500u to the below pharmacy, thank you.  [facility] - [place], [place] - [address] [facility] [address] [place] [address] Phone: [phone]  Fax: [phone]

## 2023-11-18 NOTE — Addendum Note (Signed)
Addended by: Danne Harbor on: 11/18/2023 08:53 AM   Modules accepted: Orders

## 2023-11-21 ENCOUNTER — Ambulatory Visit: Payer: Medicare Other | Admitting: Neurology

## 2023-11-21 ENCOUNTER — Telehealth: Payer: Self-pay | Admitting: Neurology

## 2023-11-21 ENCOUNTER — Encounter: Payer: Self-pay | Admitting: Neurology

## 2023-11-21 VITALS — BP 139/82 | HR 76 | Ht 69.0 in | Wt 165.0 lb

## 2023-11-21 DIAGNOSIS — G243 Spasmodic torticollis: Secondary | ICD-10-CM | POA: Diagnosis not present

## 2023-11-21 DIAGNOSIS — M542 Cervicalgia: Secondary | ICD-10-CM

## 2023-11-21 MED ORDER — ABOBOTULINUMTOXINA 500 UNITS IM SOLR: 500.0000 [IU] | Freq: Once | INTRAMUSCULAR | Status: AC

## 2023-11-21 NOTE — Telephone Encounter (Signed)
 He was not sure about the benefit from every 3 months of Dysport injection, would want to take a break from  every 3 months injection,will call back if he wants to continue with injection.

## 2023-11-21 NOTE — Telephone Encounter (Signed)
 Noted.

## 2023-11-21 NOTE — Progress Notes (Signed)
 Chief Complaint  Patient presents with   Room 14    Pt is here Alone. Pt states that shot helps his neck for about 2 weeks and that's it. Pt states that he is having pain currently. Pt denies any new concerns to discuss today.       ASSESSMENT AND PLAN  Gregory Scott is a 83 y.o. 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 Chronic neck pain  MRI of the cervical spine showed multilevel degenerative changes, most prominent C4-5, with moderate left neuroforaminal stenosis, no canal stenosis, was sent to spine specialist, not surgical candidate,  First EMG guided xeomin  injection on October 24, 2022,   Frequent head titubation, mild right tilt, retrocollis  EMG guided Dysport  injection, 500 units from [facility] specialty pharmacy (starting September 07, 2013) 500 units of Dysport /2.5 cc of normal saline   Right splenius capitis 0.5 cc Right splenius cervix 0.5 cc Right levator scapula 0.5 cc Right iliocostalis 0.5 cc  Left levator scapula 0.5 cc      He complains of bilateral lower cervical paraspinal muscle pain especially on the right side, Only get few weeks benefit from previous injection, would want to take a break from  every 3 months injection,will call back if he wants to continue with injection.    DIAGNOSTIC DATA (LABS, IMAGING, TESTING) - I reviewed patient records, labs, notes, testing and imaging myself where available.   MEDICAL HISTORY:  Gregory Scott is a 83 year old male, seen in request by his primary care PA [place], [place] for evaluation of tremor, gait abnormality,  I reviewed and summarized the referring note.PMHX. Chronic low back pain. Hypothyroidism' HLD HTN CAD Emphysema History of Kidney stone, PTSD,  OSA--could not tolerate CPAP  Reported more than 15 years history of gradual onset head titubation, only mild hand tremor, no limitation on his daily activities, he likes to work on his cars, and yard, he  denies a family history of tremor  He complains of long history of chronic low back pain, getting worse over the past few years, now often feel bilateral lower extremity numbness achy pain, from knee down, frequent bilateral lower extremity muscle cramping, leg feels cold, sometimes also have hands muscle cramp, at night, sometimes has difficulty finding a comfortable position, difficulty sleeping because of that  He was diagnosed with restless leg syndrome, taking Requip  0.5 mg seems to help him some, previously tried gabapentin  and Lyrica did not help  UPDATE Sep 13 2022: Personally reviewed MRI of the cervical spine in July 2023, multilevel degenerative changes, most prominent C4-5, broad disc osteophyte protrusion, facet hypertrophy with moderate left mild right foraminal narrowing, no canal stenosis  He was seen by spine specialist, not surgical candidate  He remains active, drive his mobile home for travel regularly, 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  Update May 15, 2023: Injection was helpful, he denies significant side effect, helped his neck pain as well,  UPDATE Aug 21 2023: He is now getting Dysport  500 cc   through [facility] specialty  pharmacy, responding well to previous injection, reported 50% improvement, for 3 weeks, following injection, he was pain-free, then gradual onset deep achy pain again  UPDATE Nov 21 2023: He complains of bilateral lower cervical paraspinal muscle pain especially on the right side, Only get few  weeks benefit from previous injection, would want to take a break from  every 3 months injection,will call back if he wants to continue with injection.    PHYSICAL EXAM:  Frequent head titubation, mild right tilt, retrocollis, slight left shoulder elevation  REVIEW OF SYSTEMS:  Full 14 system review of systems performed and notable only for as above All other review of systems were negative.   ALLERGIES: Allergies  Allergen Reactions   Statins Other (See Comments)    MYALGIAS Legs hurt in high doses    HOME MEDICATIONS: Current Outpatient Medications  Medication Sig Dispense Refill   acetaminophen -codeine  (TYLENOL  #3) 300-30 MG tablet Take 2 tablets by mouth at bedtime.     aspirin  EC 81 MG tablet Take 81 mg by mouth every morning.      Carboxymethylcellulose Sod PF 1 % GEL Place 1 drop into both eyes at bedtime as needed (dry eyes/irritation).     cycloSPORINE (RESTASIS) 0.05 % ophthalmic emulsion Place 1 drop into both eyes every 12 (twelve) hours as needed (dry eyes/irritation).     fluocinonide cream (LIDEX) 0.05 % Apply 1 application topically 2 (two) times daily as needed (itching).     IncobotulinumtoxinA  (XEOMIN ) 200 units SOLR Inject 200 Units into the muscle every 3 (three) months. 2 each 1   IncobotulinumtoxinA  (XEOMIN ) 200 units SOLR Inject 200 Units into the muscle every 3 (three) months. 1 each 1   IncobotulinumtoxinA  (XEOMIN ) 200 units SOLR Inject 200 Units into the muscle every 3 (three) months. 1 each 2   levothyroxine  (SYNTHROID , LEVOTHROID) 50 MCG tablet Take 75 mcg by mouth daily before breakfast.     lisinopril  (ZESTRIL ) 10 MG tablet Take 10 mg by mouth every morning.     nitroGLYCERIN  (NITROSTAT ) 0.4 MG SL tablet Place 1 tablet (0.4 mg total) under the tongue every 5 (five) minutes x 3 doses as needed for chest pain. 25 tablet 0   omeprazole (PRILOSEC) 20 MG capsule Take 20 mg by mouth daily as needed (heartburn/indigestion).      Propylene Glycol (SYSTANE  BALANCE) 0.6 % SOLN Place 1 drop into both eyes 4 (four) times daily as needed (dry eyes/irritation).     PROVENTIL  HFA 108 (90 BASE) MCG/ACT inhaler Take 2 puffs by mouth every 6 (six) hours as needed for wheezing or shortness of breath.      rOPINIRole  (REQUIP ) 0.5 MG tablet Take 0.5 mg by mouth at bedtime.     rosuvastatin  (CRESTOR ) 20 MG tablet Take 20 mg by mouth every Monday, Wednesday, and Friday.      Current Facility-Administered Medications  Medication Dose Route Frequency Provider Last Rate Last Admin   AbobotulinumtoxinA  (DYSPORT ) 500 units injection 500 Units  500 Units Intramuscular Once Onita Duos, MD       incobotulinumtoxinA  (XEOMIN ) 100 units injection 100 Units  100 Units Intramuscular Q90 days Onita Duos, MD       IncobotulinumtoxinA  SOLR 200 Units  200 Units Intramuscular Once Leveta Wahab, MD       IncobotulinumtoxinA  SOLR 200 Units  200 Units Intramuscular Once Onita Duos, MD        PAST MEDICAL HISTORY: Past Medical History:  Diagnosis Date   Acute MI anterior wall first episode  care [facility]) 09/21/2015   Anemia    Arthritis    back   Coronary artery disease    Dyspnea    some   Emphysema lung (HCC)    only sees  [facility]   Enlarged lymph node    GERD (gastroesophageal reflux disease)    History of hiatal hernia    History of kidney stones    2 lt kidney now  01/22/17   Hypertension    Hypothyroidism    Lung cancer (HCC)    small part in the right lower    Pneumonia    as a child    Prostate cancer (HCC)    PTSD (post-traumatic stress disorder)    Sleep apnea    sleep study-   [facility]ap    [place]  [facility]    PAST SURGICAL HISTORY: Past Surgical History:  Procedure Laterality Date   ABDOMINAL EXPOSURE N/A 01/30/2017   Procedure: ABDOMINAL EXPOSURE;  Surgeon: Krystal JULIANNA Doing, MD;  [facility];  Service: Vascular;  Laterality: N/A;   ANTERIOR LUMBAR FUSION Bilateral 01/30/2017   Procedure: LUMBAR 5-SACRAL 1 ANTERIOR INTERBODY FUSION LUMBAR;  Surgeon:  Oneil Priestly, MD;  Location: [facility] OR;  Service: Orthopedics;  Laterality: Bilateral;  LUMBAR 5-SACRUM 1 ANTERIOR INTERBODY FUSION LUMBAR    CARDIAC CATHETERIZATION N/A 09/21/2015   Procedure: Left Heart Cath and Coronary Angiography;  Surgeon: Ozell Fell, MD;  Location: [facility] INVASIVE CV LAB;  Service: Cardiovascular;  Laterality: N/A;   CARDIAC CATHETERIZATION Right 09/21/2015   Procedure: Coronary Stent Intervention;  Surgeon: Ozell Fell, MD;  Location: [facility] INVASIVE CV LAB;  Service: Cardiovascular;  Laterality: Right;   COLONOSCOPY     knee surgery     SHOULDER SURGERY     TONSILLECTOMY     VIDEO BRONCHOSCOPY WITH ENDOBRONCHIAL NAVIGATION Right 04/30/2018   Procedure: VIDEO BRONCHOSCOPY WITH ENDOBRONCHIAL NAVIGATION;  Surgeon: Shelah Lamar RAMAN, MD;  Location: [facility] OR;  Service: Thoracic;  Laterality: Right;   VIDEO BRONCHOSCOPY WITH ENDOBRONCHIAL ULTRASOUND N/A 06/19/2017   Procedure: VIDEO BRONCHOSCOPY WITH ENDOBRONCHIAL ULTRASOUND;  Surgeon: Shelah Lamar RAMAN, MD;  Location: [facility] OR;  Service: Thoracic;  Laterality: N/A;   VIDEO BRONCHOSCOPY WITH ENDOBRONCHIAL ULTRASOUND Right 04/30/2018   Procedure: VIDEO BRONCHOSCOPY WITH ENDOBRONCHIAL ULTRASOUND;  Surgeon: Shelah Lamar RAMAN, MD;  Location: [facility] OR;  Service: Thoracic;  Laterality: Right;    FAMILY HISTORY: Family History  Problem Relation Age of Onset   Coronary artery disease Mother 66   Lung cancer Father     SOCIAL HISTORY: Social History   Socioeconomic History   Marital status: Married    Spouse name: Not on file   Number of children: Not on file   Years of education: Not on file   Highest education level: Not on file  Occupational History   Occupation: retired  Tobacco Use   Smoking status: Never   Smokeless tobacco: Never  Vaping Use   Vaping status: Never Used  Substance and Sexual Activity   Alcohol  use: No    Alcohol /week: 0.0 standard drinks of alcohol    Drug use: No   Sexual activity: Not on file  Other Topics  Concern   Not on file  Social History Narrative   Worked in a Social Research Officer, [employer]   Married   Nonsmoker   Social Drivers of Corporate Investment Banker Strain: Not on file  Food Insecurity: Not on file  Transportation Needs: Not on file  Physical Activity: Not on file  Stress:  Not on file  Social Connections: Not on file  Intimate Partner Violence: Not on file      Modena Callander, M.D. Ph.D.  [facility] [address], [address] [place], [place] [address] Ph: [phone] Fax: [phone]  CC:  [place] Hunter LABOR, PA-C No address on file  [place] Hunter LABOR, PA-C

## 2023-11-21 NOTE — Progress Notes (Signed)
 Dysport 500units x 1 vail Lot: [id number] Exp: 01/16/2025 NDC: [address]-0 S/P Witnessed By: Christophe Louis, CMA

## 2023-11-25 DIAGNOSIS — H35033 Hypertensive retinopathy, bilateral: Secondary | ICD-10-CM | POA: Diagnosis not present

## 2023-11-25 DIAGNOSIS — Z961 Presence of intraocular lens: Secondary | ICD-10-CM | POA: Diagnosis not present

## 2023-11-25 DIAGNOSIS — H26493 Other secondary cataract, bilateral: Secondary | ICD-10-CM | POA: Diagnosis not present

## 2023-11-25 DIAGNOSIS — H04123 Dry eye syndrome of bilateral lacrimal glands: Secondary | ICD-10-CM | POA: Diagnosis not present

## 2023-11-26 DIAGNOSIS — C61 Malignant neoplasm of prostate: Secondary | ICD-10-CM | POA: Diagnosis not present

## 2024-01-28 DIAGNOSIS — M542 Cervicalgia: Secondary | ICD-10-CM | POA: Diagnosis not present

## 2024-01-28 DIAGNOSIS — M7912 Myalgia of auxiliary muscles, head and neck: Secondary | ICD-10-CM | POA: Diagnosis not present

## 2024-02-14 ENCOUNTER — Encounter: Payer: Self-pay | Admitting: Cardiovascular Disease

## 2024-02-14 ENCOUNTER — Ambulatory Visit
Payer: No Typology Code available for payment source | Attending: Cardiovascular Disease | Admitting: Cardiovascular Disease

## 2024-02-14 VITALS — BP 126/80 | HR 49 | Ht 69.0 in | Wt 167.8 lb

## 2024-02-14 DIAGNOSIS — I2510 Atherosclerotic heart disease of native coronary artery without angina pectoris: Secondary | ICD-10-CM

## 2024-02-14 DIAGNOSIS — E782 Mixed hyperlipidemia: Secondary | ICD-10-CM

## 2024-02-14 DIAGNOSIS — I10 Essential (primary) hypertension: Secondary | ICD-10-CM | POA: Insufficient documentation

## 2024-02-14 MED ORDER — EZETIMIBE 10 MG PO TABS: 10.0000 mg | ORAL_TABLET | Freq: Every day | ORAL | 3 refills | Status: DC

## 2024-02-14 NOTE — Patient Instructions (Signed)
 Medication Instructions:  Ezetimibe 10 mg daily *If you need a refill on your cardiac medications before your next appointment, please call your pharmacy*  Lab Work: Get repeat Labs- Lipid panel done at the [place] If you have labs (blood work) drawn today and your tests are completely normal, you will receive your results only by: MyChart Message (if you have MyChart) OR A paper copy in the mail If you have any lab test that is abnormal or we need to change your treatment, we will call you to review the results.  Follow-Up: At [facility], you and your health needs are our priority.  As part of our continuing mission to provide you with exceptional heart care, our providers are all part of one team.  This team includes your primary Cardiologist (physician) and Advanced Practice Providers or APPs (Physician Assistants and Nurse Practitioners) who all work together to provide you with the care you need, when you need it.  Your next appointment:   1 year(s)  Provider:   Dr Royann Shivers  We recommend signing up for the patient portal called "MyChart".  Sign up information is provided on this After Visit Summary.  MyChart is used to connect with patients for Virtual Visits (Telemedicine).  Patients are able to view lab/test results, encounter notes, upcoming appointments, etc.  Non-urgent messages can be sent to your provider as well.   To learn more about what you can do with MyChart, go to [email or web address].        1st Floor: - Lobby - Registration  - Pharmacy  - Lab - Cafe  2nd Floor: - PV Lab - Diagnostic Testing (echo, CT, nuclear med)  3rd Floor: - Vacant  4th Floor: - TCTS (cardiothoracic surgery) - AFib Clinic - Structural Heart Clinic - Vascular Surgery  - Vascular Ultrasound  5th Floor: - HeartCare Cardiology (general and EP) - Clinical Pharmacy for coumadin, hypertension, lipid, weight-loss medications, and med management appointments    Valet  parking services will be available as well.

## 2024-02-19 ENCOUNTER — Telehealth: Payer: Self-pay | Admitting: Cardiovascular Disease

## 2024-02-19 ENCOUNTER — Ambulatory Visit: Payer: Medicare Other | Admitting: Neurology

## 2024-02-19 MED ORDER — EZETIMIBE 10 MG PO TABS: 10.0000 mg | ORAL_TABLET | Freq: Every day | ORAL | 3 refills | Status: AC

## 2024-02-19 NOTE — Telephone Encounter (Signed)
*  STAT* If patient is at the pharmacy, call can be transferred to refill team.   1. Which medications need to be refilled? (please list name of each medication and dose if known)  ezetimibe (ZETIA) 10 MG tablet  2. Which pharmacy/location (including street and city if local pharmacy) is medication to be sent to? [id number] - [place], [place] - [address] Phone: [phone]  Fax: [phone]     3. Do they need a 30 day or 90 day supply? 90

## 2024-02-19 NOTE — Telephone Encounter (Signed)
 Spoke to patient Zetia 90 day refill [facility] [address]

## 2024-02-22 NOTE — Progress Notes (Signed)
 Cardiology Office Note:  .   Date:  02/22/2024  ID:  Gregory Scott, DOB 1941/01/23, MRN [id number] PCP: Blanchie Serve  [facility] Prov[facility] Cardiologist:  Thurmon Fair, MD    History of Present Illness: Marland Kitchen   Gregory Scott is a 83 y.o. male Gregory Scott is an 83 year old male with coronary artery disease who presents for follow-up regarding his cardiovascular health.  He is transitioning care from Dr. Servando Salina.  He has a history of a myocardial infarction in 2016, treated with stent placement. He recalls a possible link to [employer] exposure. Since then, he has not experienced similar severe chest pain, except for an episode a couple of years ago during physical exertion, which resolved with rest. No recent episodes of chest pain at rest.  He is currently taking rosuvastatin 20 mg three times a week (due to previous muscle pain when taken daily). His LDL cholesterol is 88, which is higher than the desired level for someone with coronary artery disease. His triglycerides are slightly elevated, which he attributes to his diet, particularly sweets and sweet tea.  His blood pressure readings at home are often high, though he is unsure of the accuracy of his home monitor. A recent reading was 136/82, which he acknowledges is higher than previous readings taken in a medical setting.  In the office today his blood pressure was initially 126/80, when I rechecked it it was 139/84.  He is physically active, often working in the yard, and enjoys sweets, which he refers to as 'diet food'. He acknowledges that his diet may contribute to his elevated triglycerides.  He has treated hypothyroidism on levothyroxine supplementation.  [facility] T[facility] was borderline at 0.549 and his dose of levothyroxine was reduced to 50 mcg daily.  He has a history of varicose veins, which he describes as peculiar in location. He is aware of the potential for bleeding if  injured and takes precautions during physical activities.    Studies Reviewed: Marland Kitchen   EKG Interpretation Date/Time:  Friday February 14 2024 10:53:38 EDT Ventricular Rate:  49 PR Interval:  158 QRS Duration:  90 QT Interval:  440 QTC Calculation: 397 R Axis:   -2  Text Interpretation: Sinus bradycardia Left ventricular hypertrophy with repolarization abnormality ( R in aVL , Cornell product ) When compared with ECG of 17-Oct-2020 08:29,  secondary repolarization changes are more prominent Confirmed by Ladaysha Soutar [phone]) on 02/14/2024 11:16:26 AM    [facility] Cholesterol 162, HDL 47.6, LDL 88, triglycerides 133 BUN 12, creatinine 1.1, potassium 5.0, glucose 98, normal liver function tests except for borderline direct bilirubin 0.4, borderline TSH 0.549 (lower limit of normal 0.55   Risk Assessment/Calculations:          Physical Exam:   VS:  BP 126/80 (BP Location: Left Arm, Patient Position: Sitting, Cuff Size: Normal)   Pulse (!) 49   Ht 5\' 9"  (1.753 m)   Wt 167 lb 12.8 oz (76.1 kg)   SpO2 97%   BMI 24.78 kg/m    Wt Readings from Last 3 Encounters:  02/14/24 167 lb 12.8 oz (76.1 kg)  11/21/23 165 lb (74.8 kg)  02/01/23 159 lb 3.2 oz (72.2 kg)    GEN: Well nourished, well developed in no acute distress NECK: No JVD; No carotid bruits CARDIAC: RRR, no murmurs, rubs, gallops RESPIRATORY:  Clear to auscultation without rales, wheezing or rhonchi  ABDOMEN: Soft,  non-tender, non-distended EXTREMITIES:  No edema; No deformity   ASSESSMENT AND PLAN: .     CAD : with myocardial infarction in 2016, treated with stent placement, possibly linked to Agent [employer] exposure. Current LDL cholesterol is 88, above the target of <70 for CAD. Triglycerides are slightly elevated, likely due to dietary habits. Emphasized maintaining LDL <70 to prevent further cardiac events. Explained that 25% of individuals with unstable angina may progress to myocardial infarction.  Explained differences between stable and unstable angina and the importance of recognizing symptoms of a more serious condition. Emphasized that well-managed angina can persist for years without leading to myocardial infarction, but unstable angina requires immediate medical attention. Hypertension: Discussed the fact that we should not check his home blood pressure monitor for accuracy at his next office visit and that he should always check his blood pressure when he has been fully relaxed sitting down for at least 10-15 minutes. Varicose Veins:  on the foot, unusual location but not concerning for deep vein thrombosis. Explained differences between superficial and deep venous systems and associated risks. Advised avoiding injury to prevent excessive bleeding. HLP: Target LDL less than 70.  Add ezetimibe 10 mg daily.  He will have repeat labs in May at the [facility] and asked him to send [place] a copy of those results.        Dispo:  Patient Instructions  Medication Instructions:  Ezetimibe 10 mg daily *If you need a refill on your cardiac medications before your next appointment, please call your pharmacy*  Lab Work: Get repeat Labs- Lipid panel done at the [place] If you have labs (blood work) drawn today and your tests are completely normal, you will receive your results only by: MyChart Message (if you have MyChart) OR A paper copy in the mail If you have any lab test that is abnormal or we need to change your treatment, we will call you to review the results.  Follow-Up: At [facility], you and your health needs are our priority.  As part of our continuing mission to provide you with exceptional heart care, our providers are all part of one team.  This team includes your primary Cardiologist (physician) and Advanced Practice Providers or APPs (Physician Assistants and Nurse Practitioners) who all work together to provide you with the care you need, when you need it.  Your next  appointment:   1 year(s)  Provider:   Dr Royann Shivers  We recommend signing up for the patient portal called "MyChart".  Sign up information is provided on this After Visit Summary.  MyChart is used to connect with patients for Virtual Visits (Telemedicine).  Patients are able to view lab/test results, encounter notes, upcoming appointments, etc.  Non-urgent messages can be sent to your provider as well.   To learn more about what you can do with MyChart, go to [email or web address].        1st Floor: - Lobby - Registration  - Pharmacy  - Lab - Cafe  2nd Floor: - PV Lab - Diagnostic Testing (echo, CT, nuclear med)  3rd Floor: - Vacant  4th Floor: - TCTS (cardiothoracic surgery) - AFib Clinic - Structural Heart Clinic - Vascular Surgery  - Vascular Ultrasound  5th Floor: - HeartCare Cardiology (general and EP) - Clinical Pharmacy for coumadin, hypertension, lipid, weight-loss medications, and med management appointments    Valet parking services will be available as well.      Signed, Thurmon Fair, MD

## 2024-02-27 DIAGNOSIS — C61 Malignant neoplasm of prostate: Secondary | ICD-10-CM | POA: Diagnosis not present

## 2024-03-17 DIAGNOSIS — M722 Plantar fascial fibromatosis: Secondary | ICD-10-CM | POA: Diagnosis not present

## 2024-04-30 DIAGNOSIS — M722 Plantar fascial fibromatosis: Secondary | ICD-10-CM | POA: Diagnosis not present

## 2024-06-05 DIAGNOSIS — C61 Malignant neoplasm of prostate: Secondary | ICD-10-CM | POA: Diagnosis not present
